# Patient Record
Sex: Female | Born: 1935 | Race: White | Hispanic: No | Marital: Married | Smoking: Former smoker
Health system: Southern US, Community
[De-identification: ages and names within clinical notes are randomized; demographics above are authoritative.]

## PROBLEM LIST (undated history)

## (undated) DIAGNOSIS — R55 Syncope and collapse: Secondary | ICD-10-CM

## (undated) DIAGNOSIS — E440 Moderate protein-calorie malnutrition: Secondary | ICD-10-CM

## (undated) DIAGNOSIS — R2681 Unsteadiness on feet: Secondary | ICD-10-CM

## (undated) DIAGNOSIS — E785 Hyperlipidemia, unspecified: Secondary | ICD-10-CM

## (undated) DIAGNOSIS — Z9181 History of falling: Secondary | ICD-10-CM

## (undated) DIAGNOSIS — I7090 Unspecified atherosclerosis: Secondary | ICD-10-CM

## (undated) DIAGNOSIS — F32A Depression, unspecified: Secondary | ICD-10-CM

## (undated) DIAGNOSIS — R41841 Cognitive communication deficit: Secondary | ICD-10-CM

## (undated) DIAGNOSIS — M6281 Muscle weakness (generalized): Secondary | ICD-10-CM

## (undated) DIAGNOSIS — F0390 Unspecified dementia without behavioral disturbance: Secondary | ICD-10-CM

## (undated) DIAGNOSIS — E1149 Type 2 diabetes mellitus with other diabetic neurological complication: Secondary | ICD-10-CM

## (undated) DIAGNOSIS — E119 Type 2 diabetes mellitus without complications: Secondary | ICD-10-CM

## (undated) DIAGNOSIS — I10 Essential (primary) hypertension: Secondary | ICD-10-CM

## (undated) DIAGNOSIS — S72142A Displaced intertrochanteric fracture of left femur, initial encounter for closed fracture: Secondary | ICD-10-CM

## (undated) HISTORY — PX: JOINT REPLACEMENT: SHX530

## (undated) HISTORY — DX: Type 2 diabetes mellitus with other diabetic neurological complication: E11.49

---

## 2019-08-18 ENCOUNTER — Emergency Department (HOSPITAL_COMMUNITY)
Admission: EM | Admit: 2019-08-18 | Discharge: 2019-08-18 | Disposition: A | Discharge status: Home / Self Care | Payer: BC Managed Care – PPO | Attending: Emergency Medicine | Admitting: Emergency Medicine

## 2019-08-18 ENCOUNTER — Emergency Department (HOSPITAL_COMMUNITY): Payer: BC Managed Care – PPO

## 2019-08-18 ENCOUNTER — Encounter (HOSPITAL_COMMUNITY): Payer: Self-pay | Admitting: Emergency Medicine

## 2019-08-18 ENCOUNTER — Other Ambulatory Visit: Payer: Self-pay

## 2019-08-18 DIAGNOSIS — R531 Weakness: Secondary | ICD-10-CM | POA: Insufficient documentation

## 2019-08-18 DIAGNOSIS — F0390 Unspecified dementia without behavioral disturbance: Secondary | ICD-10-CM | POA: Insufficient documentation

## 2019-08-18 DIAGNOSIS — Z794 Long term (current) use of insulin: Secondary | ICD-10-CM | POA: Insufficient documentation

## 2019-08-18 DIAGNOSIS — R55 Syncope and collapse: Secondary | ICD-10-CM | POA: Diagnosis present

## 2019-08-18 DIAGNOSIS — Z79899 Other long term (current) drug therapy: Secondary | ICD-10-CM | POA: Diagnosis not present

## 2019-08-18 DIAGNOSIS — E119 Type 2 diabetes mellitus without complications: Secondary | ICD-10-CM | POA: Insufficient documentation

## 2019-08-18 HISTORY — DX: Syncope and collapse: R55

## 2019-08-18 HISTORY — DX: Unspecified dementia, unspecified severity, without behavioral disturbance, psychotic disturbance, mood disturbance, and anxiety: F03.90

## 2019-08-18 HISTORY — DX: Type 2 diabetes mellitus without complications: E11.9

## 2019-08-18 LAB — URINALYSIS, ROUTINE W REFLEX MICROSCOPIC
Bacteria, UA: NONE SEEN
Bilirubin Urine: NEGATIVE
Glucose, UA: NEGATIVE mg/dL
Hgb urine dipstick: NEGATIVE
Ketones, ur: NEGATIVE mg/dL
Nitrite: NEGATIVE
Protein, ur: NEGATIVE mg/dL
Specific Gravity, Urine: 1.023 (ref 1.005–1.030)
pH: 5 (ref 5.0–8.0)

## 2019-08-18 LAB — COMPREHENSIVE METABOLIC PANEL
ALT: 14 U/L (ref 0–44)
AST: 20 U/L (ref 15–41)
Albumin: 3.9 g/dL (ref 3.5–5.0)
Alkaline Phosphatase: 50 U/L (ref 38–126)
Anion gap: 8 (ref 5–15)
BUN: 17 mg/dL (ref 8–23)
CO2: 28 mmol/L (ref 22–32)
Calcium: 9.3 mg/dL (ref 8.9–10.3)
Chloride: 105 mmol/L (ref 98–111)
Creatinine, Ser: 0.98 mg/dL (ref 0.44–1.00)
GFR calc Af Amer: >60 mL/min (ref >60)
GFR calc non Af Amer: 54 mL/min — ABNORMAL LOW (ref >60)
Glucose, Bld: 114 mg/dL — ABNORMAL HIGH (ref 70–99)
Potassium: 4.2 mmol/L (ref 3.5–5.1)
Sodium: 141 mmol/L (ref 135–145)
Total Bilirubin: 0.5 mg/dL (ref 0.3–1.2)
Total Protein: 7 g/dL (ref 6.5–8.1)

## 2019-08-18 LAB — TROPONIN I (HIGH SENSITIVITY)
Troponin I (High Sensitivity): 8 ng/L (ref <18)
Troponin I (High Sensitivity): 8 ng/L (ref <18)

## 2019-08-18 LAB — CBC
HCT: 41.7 % (ref 36.0–46.0)
Hemoglobin: 13.2 g/dL (ref 12.0–15.0)
MCH: 31.3 pg (ref 26.0–34.0)
MCHC: 31.7 g/dL (ref 30.0–36.0)
MCV: 98.8 fL (ref 80.0–100.0)
Platelets: 175 10*3/uL (ref 150–400)
RBC: 4.22 MIL/uL (ref 3.87–5.11)
RDW: 14 % (ref 11.5–15.5)
WBC: 6.6 10*3/uL (ref 4.0–10.5)
nRBC: 0 % (ref 0.0–0.2)

## 2019-08-18 LAB — HEMOGLOBIN A1C
Hgb A1c MFr Bld: 7.1 % — ABNORMAL HIGH (ref 4.8–5.6)
Mean Plasma Glucose: 157.07 mg/dL

## 2019-08-18 LAB — CBG MONITORING, ED: Glucose-Capillary: 184 mg/dL — ABNORMAL HIGH (ref 70–99)

## 2019-08-18 MED ORDER — LORAZEPAM 2 MG/ML IJ SOLN
0.5000 mg | Freq: Once | INTRAMUSCULAR | Status: AC
  Administered 2019-08-18: 0.5 mg via INTRAVENOUS
  Filled 2019-08-18: qty 1

## 2019-08-18 NOTE — ED Triage Notes (Signed)
Patient and daughter are poor historians. They are unaware what medication the patient takes or past medical history. Recent move from [place].

## 2019-08-18 NOTE — ED Triage Notes (Signed)
Pt here for evaluation for syncopal episodes. Patients daughter states that she has had 3 episodes over the past 2 weeks. Two times she was on the toilet and one time on the chair.

## 2019-08-18 NOTE — Discharge Instructions (Addendum)
You were seen in the ER for possible episodes of passing out.  Your CT scan was negative for any signs of new or old stroke / brain bleeds.  Your labs do not show any electrolyte abnormalities, EKG and labs do not show a signs of heart attack or infection.  Your blood sugar is slightly elevated but this is consistent with your diabetes.  At this point in your care we have ruled out life-threatening causes of your episodes.  Please follow-up with your neurologist and primary care provider for further management of your symptoms.  Return to the ER if your symptoms worsen.

## 2019-08-18 NOTE — ED Provider Notes (Signed)
[facility] EMERGENCY DEPARTMENT Provider Note   CSN: [id number] Arrival date & time: 08/18/19  1246     History Chief Complaint  Patient presents with  . syncopal episodes    Misty Price is a 84 y.o. female.  HPI  84 year old female with a known history of DM TII (unknown  other medical problems as patient and daughter are poor historians and no previous records available) presents to the ER after 3 "syncopal" episodes in the ER.  History provided mostly by the patient's daughter who is at bedside. Daughter reports that her mother has been living in [name] with her husband up until approximately a month ago.  Patient's daughter and her sister are largely unaware of the patient's history other than the fact that she has diabetes which requires blood sugar checks in the morning and taking insulin at night.  Patient's first episode happened approximately 3 weeks ago in the bathroom.  Patient was on the toilet and the daughter reports that she closed her eyes and started slumping over.  Daughter is unsure if she was completely unconscious but states "I yelled at her a couple times and she came back to me".  The first episode was witnessed by the patient's daughter who is at bedside, the second episode was witnessed by her other daughter and her husband.  Third episode was was witnessed by the daughter who is at bedside.  During these episodes, daughter denies any pale skin, twitching, seizures, diaphoresis, postictal confusion.  The last episode occurred on Monday and was not as bad per the patient's daughter, stating "she tried to pass out on me but I kept yelling and she stayed with me".  Daughter has not noticed any neuro deficits, patient ambulating normally. All episodes had a similar timeline- first thing in the morning and with patient sitting on a chair or toilet.  Patient has dementia at baseline.  Unclear if she is on blood thinners.  She is also unaware of what meds she takes.  No  history of strokes.  Unclear if she has any cardiac history.  The daughter states that she thought that her mother's blood sugar might have been low before these episodes and so she gave her orange juice. After these episodes, patient appears normal throughout the rest of the day.   Patient was recently taken to PCP Catalina Pizza MD in [place] and he had concerns for possible seizure activity and that she needed a CT scan.  She was referred to a neurologist but they were booked out for few weeks and patient's daughter was told that the quickest way to get a CT scan was in the ER which is what brings them in today.   It is important to underline that a proper medical and medication history was difficult to obtainas patient and daughter poor historians. Patient reports no symptoms at this time.   Past Medical History:  Diagnosis Date  . Dementia (HCC)   . Diabetes mellitus without complication (HCC)   . Syncopal episodes     There are no problems to display for this patient.   Past Surgical History:  Procedure Laterality Date  . JOINT REPLACEMENT     bilateral knee replacement     OB History   No obstetric history on file.     No family history on file.  Social History   Tobacco Use  . Smoking status: Former Games developer  . Smokeless tobacco: Never Used  Substance Use Topics  . Alcohol use:  Not on file  . Drug use: Not on file    Home Medications Prior to Admission medications   Medication Sig Start Date End Date Taking? Authorizing Provider  aspirin EC 81 MG tablet Take 81 mg by mouth daily.   Yes [provider]  donepezil (ARICEPT) 10 MG tablet Take 10 mg by mouth daily.   Yes [provider]  gabapentin (NEURONTIN) 300 MG capsule Take 300 mg by mouth at bedtime.   Yes [provider]  insulin glargine (LANTUS SOLOSTAR) 100 UNIT/ML Solostar Pen Inject 34 Units into the skin at bedtime.   Yes [provider]  isosorbide mononitrate (IMDUR) 60  MG 24 hr tablet Take 60 mg by mouth daily.   Yes [provider]  levETIRAcetam (KEPPRA) 500 MG tablet Take 500 mg by mouth 2 (two) times daily.   Yes [provider]  linagliptin (TRADJENTA) 5 MG TABS tablet Take 5 mg by mouth daily.   Yes [provider]  lisinopril (ZESTRIL) 20 MG tablet Take 20 mg by mouth daily.   Yes [provider]  memantine (NAMENDA) 10 MG tablet Take 10 mg by mouth 2 (two) times daily.   Yes [provider]  metoprolol tartrate (LOPRESSOR) 50 MG tablet Take 50 mg by mouth 2 (two) times daily.   Yes [provider]  sertraline (ZOLOFT) 100 MG tablet Take 100 mg by mouth daily.   Yes [provider]    Allergies    Patient has no allergy information on record.  Review of Systems   Review of Systems  Constitutional: Negative for appetite change, chills, diaphoresis and fever.  HENT: Negative for ear pain, sinus pain and sore throat.   Eyes: Negative for pain and visual disturbance.  Respiratory: Negative for cough and shortness of breath.   Cardiovascular: Negative for chest pain and palpitations.  Gastrointestinal: Negative for abdominal pain and vomiting.  Endocrine: Negative for polydipsia, polyphagia and polyuria.  Genitourinary: Negative for dysuria, hematuria and pelvic pain.  Musculoskeletal: Negative for arthralgias and back pain.  Skin: Negative for color change and rash.  Neurological: Positive for syncope and weakness. Negative for dizziness and seizures.  All other systems reviewed and are negative.   Physical Exam Updated Vital Signs BP (!) 152/67 (BP Location: Left Arm)   Pulse (!) 53   Temp 97.8 F (36.6 C) (Oral)   Resp 18   Ht 5\' 2"  (1.575 m)   SpO2 94%   Physical Exam Vitals and nursing note reviewed.  Constitutional:      General: She is not in acute distress.    Appearance: Normal appearance. She is well-developed. She is not ill-appearing, toxic-appearing or  diaphoretic.  HENT:     Head: Normocephalic and atraumatic.     Mouth/Throat:     Mouth: Mucous membranes are moist.     Pharynx: Oropharynx is clear.  Eyes:     Extraocular Movements: Extraocular movements intact.     Conjunctiva/sclera: Conjunctivae normal.     Pupils: Pupils are equal, round, and reactive to light.  Cardiovascular:     Rate and Rhythm: Normal rate and regular rhythm.     Heart sounds: No murmur.  Pulmonary:     Effort: Pulmonary effort is normal. No respiratory distress.     Breath sounds: Normal breath sounds.  Abdominal:     General: Abdomen is flat.     Palpations: Abdomen is soft.     Tenderness: There is no abdominal tenderness.  Musculoskeletal:        General: Normal range of motion.     Cervical back: Neck supple.  Skin:    General: Skin is warm and dry.  Neurological:     General: No focal deficit present.     Mental Status: She is alert. Mental status is at baseline.     Cranial Nerves: No cranial nerve deficit.     Sensory: No sensory deficit.     Motor: No weakness.     Coordination: Coordination normal.     Gait: Gait normal.     Deep Tendon Reflexes: Reflexes normal.  Psychiatric:        Mood and Affect: Mood normal.        Behavior: Behavior normal.     ED Results / Procedures / Treatments   Labs (all labs ordered are listed, but only abnormal results are displayed) Labs Reviewed  COMPREHENSIVE METABOLIC PANEL - Abnormal; Notable for the following components:      Result Value   Glucose, Bld 114 (*)    GFR calc non Af Amer 54 (*)    All other components within normal limits  URINALYSIS, ROUTINE W REFLEX MICROSCOPIC - Abnormal; Notable for the following components:   Leukocytes,Ua TRACE (*)    All other components within normal limits  CBG MONITORING, ED - Abnormal; Notable for the following components:   Glucose-Capillary 184 (*)    All other components within normal limits  CBC  HEMOGLOBIN A1C  TROPONIN I (HIGH SENSITIVITY)    TROPONIN I (HIGH SENSITIVITY)    EKG None  Radiology CT Head Wo Contrast  Result Date: 08/18/2019 CLINICAL DATA:  Recurrent syncope EXAM: CT HEAD WITHOUT CONTRAST TECHNIQUE: Contiguous axial images were obtained from the base of the skull through the vertex without intravenous contrast. COMPARISON:  None. FINDINGS: Brain: No acute infarct or hemorrhage. Lateral ventricles and midline structures are unremarkable. No acute extra-axial fluid collections. No mass effect. Vascular: No hyperdense vessel or unexpected calcification. Skull: Normal. Negative for fracture or focal lesion. Sinuses/Orbits: Partial opacification right sphenoid air cells. Remaining sinuses are clear. Other: None IMPRESSION: 1. No acute intracranial pathology. 2. Partial opacification right sphenoid air cells. Electronically Signed   By: Sharlet Salina M.D.   On: 08/18/2019 15:42    Procedures Procedures (including critical care time)  Medications Ordered in ED Medications  LORazepam (ATIVAN) injection 0.5 mg (0.5 mg Intravenous Given 08/18/19 1615)    ED Course  I have reviewed the triage vital signs and the nursing notes.  Pertinent labs & imaging results that were available during my care of the patient were reviewed by me and considered in my medical decision making (see chart for details).    MDM Rules/Calculators/A&P                       84 year old female with a history of diabetes, unknown other medical problems  presents to the ER for "syncopal" episodes.  Patient mildly hypertensive on presentation with a pulse of 53.  It is unclear if this is her baseline as no previous records of vitals can be found and family is unaware of her baseline.  Patient well-appearing, no acute distress, alert and pleasantly confused, dementia at baseline, able to answer questions, no noticeable neuro or musculoskeletal deficits.  Glucose mildly elevated at 114 but otherwise CMP not concerning for electrolyte abnormalities or  kidney dysfunction.  Urinalysis negative for UTI.  Initial troponin normal.  After discussion with  Dr. Estell Harpin, a second troponin is not indicated.  Bedside blood glucose 184.  A1c ordered but likely will not receive results back today.  Initial troponin negative.  CT scan negative for stroke, intracranial bleed, or any acute pathology. The patient denies any symptoms of neurological impairment or TIA's; no amaurosis, diplopia, dysphasia, or unilateral disturbance of motor or sensory function. No loss of balance or vertigo.  Orthostatic systolic pressure did drop from sitting to standing, however heart rate was remained normal and the patient was asymptomatic. EKG not concerning for acute MI or ischemia.  At this point in ED course, doubt stroke, TIA, intracranial bleed, seizure, dissection, electrolyte abnormality.  Etiology of episodes unclear, could potentially be due to low blood glucose in the mornings.  Daughter does state that her primary care had decreased her insulin a little bit at her last visit on Monday. Discussed ED course with the patient's daughter, encouraged her to follow-up with patient's primary care doctor to manage her DM. Return precautions given.  Patient's daughter voices understanding and is agreeable to this plan.  This patient was seen and evaluated by Dr. Estell Harpin, he agrees with the plan of care.   Final Clinical Impression(s) / ED Diagnoses Final diagnoses:  Weakness    Rx / DC Orders ED Discharge Orders    None       Leone Brand 08/18/19 1736    Bethann Berkshire, MD 08/18/19 2111

## 2019-09-26 ENCOUNTER — Encounter (HOSPITAL_COMMUNITY): Payer: Self-pay | Admitting: *Deleted

## 2019-09-26 ENCOUNTER — Other Ambulatory Visit: Payer: Self-pay

## 2019-09-26 ENCOUNTER — Emergency Department (HOSPITAL_COMMUNITY): Payer: Medicare HMO

## 2019-09-26 ENCOUNTER — Observation Stay (HOSPITAL_COMMUNITY)
Admission: EM | Admit: 2019-09-26 | Discharge: 2019-09-28 | Disposition: A | Discharge status: Home / Self Care | Payer: Medicare HMO | Attending: Emergency Medicine | Admitting: Emergency Medicine

## 2019-09-26 DIAGNOSIS — E1169 Type 2 diabetes mellitus with other specified complication: Secondary | ICD-10-CM | POA: Diagnosis present

## 2019-09-26 DIAGNOSIS — E119 Type 2 diabetes mellitus without complications: Secondary | ICD-10-CM | POA: Insufficient documentation

## 2019-09-26 DIAGNOSIS — M7989 Other specified soft tissue disorders: Secondary | ICD-10-CM | POA: Diagnosis not present

## 2019-09-26 DIAGNOSIS — E1151 Type 2 diabetes mellitus with diabetic peripheral angiopathy without gangrene: Secondary | ICD-10-CM | POA: Diagnosis present

## 2019-09-26 DIAGNOSIS — R296 Repeated falls: Secondary | ICD-10-CM | POA: Diagnosis not present

## 2019-09-26 DIAGNOSIS — Z20822 Contact with and (suspected) exposure to covid-19: Secondary | ICD-10-CM | POA: Diagnosis not present

## 2019-09-26 DIAGNOSIS — Z79899 Other long term (current) drug therapy: Secondary | ICD-10-CM | POA: Insufficient documentation

## 2019-09-26 DIAGNOSIS — Z7982 Long term (current) use of aspirin: Secondary | ICD-10-CM | POA: Diagnosis not present

## 2019-09-26 DIAGNOSIS — I10 Essential (primary) hypertension: Secondary | ICD-10-CM | POA: Insufficient documentation

## 2019-09-26 DIAGNOSIS — W1839XA Other fall on same level, initial encounter: Secondary | ICD-10-CM | POA: Insufficient documentation

## 2019-09-26 DIAGNOSIS — R079 Chest pain, unspecified: Secondary | ICD-10-CM | POA: Diagnosis not present

## 2019-09-26 DIAGNOSIS — M79621 Pain in right upper arm: Secondary | ICD-10-CM | POA: Diagnosis not present

## 2019-09-26 DIAGNOSIS — E118 Type 2 diabetes mellitus with unspecified complications: Secondary | ICD-10-CM | POA: Diagnosis present

## 2019-09-26 DIAGNOSIS — W19XXXA Unspecified fall, initial encounter: Secondary | ICD-10-CM | POA: Diagnosis not present

## 2019-09-26 DIAGNOSIS — S79912A Unspecified injury of left hip, initial encounter: Secondary | ICD-10-CM | POA: Diagnosis not present

## 2019-09-26 DIAGNOSIS — S0990XA Unspecified injury of head, initial encounter: Secondary | ICD-10-CM | POA: Diagnosis not present

## 2019-09-26 DIAGNOSIS — Z03818 Encounter for observation for suspected exposure to other biological agents ruled out: Secondary | ICD-10-CM | POA: Diagnosis not present

## 2019-09-26 DIAGNOSIS — E1159 Type 2 diabetes mellitus with other circulatory complications: Secondary | ICD-10-CM | POA: Diagnosis present

## 2019-09-26 DIAGNOSIS — Z794 Long term (current) use of insulin: Secondary | ICD-10-CM | POA: Diagnosis not present

## 2019-09-26 DIAGNOSIS — F0390 Unspecified dementia without behavioral disturbance: Secondary | ICD-10-CM | POA: Insufficient documentation

## 2019-09-26 DIAGNOSIS — Z96653 Presence of artificial knee joint, bilateral: Secondary | ICD-10-CM | POA: Diagnosis not present

## 2019-09-26 DIAGNOSIS — M25552 Pain in left hip: Secondary | ICD-10-CM | POA: Diagnosis not present

## 2019-09-26 DIAGNOSIS — S3992XA Unspecified injury of lower back, initial encounter: Secondary | ICD-10-CM | POA: Diagnosis not present

## 2019-09-26 DIAGNOSIS — S4991XA Unspecified injury of right shoulder and upper arm, initial encounter: Secondary | ICD-10-CM | POA: Diagnosis not present

## 2019-09-26 DIAGNOSIS — S299XXA Unspecified injury of thorax, initial encounter: Secondary | ICD-10-CM | POA: Diagnosis not present

## 2019-09-26 DIAGNOSIS — S199XXA Unspecified injury of neck, initial encounter: Secondary | ICD-10-CM | POA: Diagnosis not present

## 2019-09-26 DIAGNOSIS — M25551 Pain in right hip: Secondary | ICD-10-CM | POA: Diagnosis not present

## 2019-09-26 DIAGNOSIS — S79911A Unspecified injury of right hip, initial encounter: Secondary | ICD-10-CM | POA: Diagnosis not present

## 2019-09-26 DIAGNOSIS — R55 Syncope and collapse: Principal | ICD-10-CM

## 2019-09-26 LAB — URINALYSIS, ROUTINE W REFLEX MICROSCOPIC
Bacteria, UA: NONE SEEN
Bilirubin Urine: NEGATIVE
Glucose, UA: NEGATIVE mg/dL
Ketones, ur: 5 mg/dL — AB
Leukocytes,Ua: NEGATIVE
Nitrite: NEGATIVE
Protein, ur: NEGATIVE mg/dL
Specific Gravity, Urine: 1.011 (ref 1.005–1.030)
pH: 7 (ref 5.0–8.0)

## 2019-09-26 LAB — CBC WITH DIFFERENTIAL/PLATELET
Abs Immature Granulocytes: 0.04 10*3/uL (ref 0.00–0.07)
Basophils Absolute: 0 10*3/uL (ref 0.0–0.1)
Basophils Relative: 0 %
Eosinophils Absolute: 0.1 10*3/uL (ref 0.0–0.5)
Eosinophils Relative: 1 %
HCT: 42.2 % (ref 36.0–46.0)
Hemoglobin: 13.5 g/dL (ref 12.0–15.0)
Immature Granulocytes: 0 %
Lymphocytes Relative: 15 %
Lymphs Abs: 1.5 10*3/uL (ref 0.7–4.0)
MCH: 30.7 pg (ref 26.0–34.0)
MCHC: 32 g/dL (ref 30.0–36.0)
MCV: 95.9 fL (ref 80.0–100.0)
Monocytes Absolute: 0.6 10*3/uL (ref 0.1–1.0)
Monocytes Relative: 6 %
Neutro Abs: 7.9 10*3/uL — ABNORMAL HIGH (ref 1.7–7.7)
Neutrophils Relative %: 78 %
Platelets: 153 10*3/uL (ref 150–400)
RBC: 4.4 MIL/uL (ref 3.87–5.11)
RDW: 13.4 % (ref 11.5–15.5)
WBC: 10.1 10*3/uL (ref 4.0–10.5)
nRBC: 0 % (ref 0.0–0.2)

## 2019-09-26 LAB — COMPREHENSIVE METABOLIC PANEL
ALT: 14 U/L (ref 0–44)
AST: 21 U/L (ref 15–41)
Albumin: 3.9 g/dL (ref 3.5–5.0)
Alkaline Phosphatase: 56 U/L (ref 38–126)
Anion gap: 9 (ref 5–15)
BUN: 14 mg/dL (ref 8–23)
CO2: 26 mmol/L (ref 22–32)
Calcium: 8.9 mg/dL (ref 8.9–10.3)
Chloride: 103 mmol/L (ref 98–111)
Creatinine, Ser: 0.88 mg/dL (ref 0.44–1.00)
GFR calc Af Amer: >60 mL/min (ref >60)
GFR calc non Af Amer: >60 mL/min (ref >60)
Glucose, Bld: 137 mg/dL — ABNORMAL HIGH (ref 70–99)
Potassium: 3.8 mmol/L (ref 3.5–5.1)
Sodium: 138 mmol/L (ref 135–145)
Total Bilirubin: 0.7 mg/dL (ref 0.3–1.2)
Total Protein: 6.8 g/dL (ref 6.5–8.1)

## 2019-09-26 LAB — CK: Total CK: 46 U/L (ref 38–234)

## 2019-09-26 LAB — TSH: TSH: 3.187 u[IU]/mL (ref 0.350–4.500)

## 2019-09-26 LAB — PHOSPHORUS: Phosphorus: 3.1 mg/dL (ref 2.5–4.6)

## 2019-09-26 LAB — MAGNESIUM: Magnesium: 1.9 mg/dL (ref 1.7–2.4)

## 2019-09-26 MED ORDER — ACETAMINOPHEN 325 MG PO TABS
650.0000 mg | ORAL_TABLET | Freq: Once | ORAL | Status: AC
  Administered 2019-09-26: 16:00:00 650 mg via ORAL
  Filled 2019-09-26: qty 2

## 2019-09-26 MED ORDER — HYDRALAZINE HCL 20 MG/ML IJ SOLN
5.0000 mg | Freq: Once | INTRAMUSCULAR | Status: AC
  Administered 2019-09-26: 21:00:00 5 mg via INTRAVENOUS
  Filled 2019-09-26: qty 1

## 2019-09-26 MED ORDER — ISOSORBIDE MONONITRATE ER 60 MG PO TB24
60.0000 mg | ORAL_TABLET | Freq: Every day | ORAL | Status: DC
  Administered 2019-09-27 – 2019-09-28 (×2): 60 mg via ORAL
  Filled 2019-09-26 (×5): qty 1

## 2019-09-26 MED ORDER — LISINOPRIL 10 MG PO TABS
20.0000 mg | ORAL_TABLET | Freq: Every day | ORAL | Status: DC
  Administered 2019-09-26 – 2019-09-28 (×3): 20 mg via ORAL
  Filled 2019-09-26 (×3): qty 2

## 2019-09-26 MED ORDER — POTASSIUM CHLORIDE IN NACL 20-0.9 MEQ/L-% IV SOLN: INTRAVENOUS | Status: DC

## 2019-09-26 MED ORDER — POTASSIUM CHLORIDE IN NACL 20-0.9 MEQ/L-% IV SOLN
INTRAVENOUS | Status: AC
  Filled 2019-09-26: qty 1000

## 2019-09-26 NOTE — ED Notes (Signed)
Helped pt up the the bedside commode, pt then started looking off and stopped talking and interacting. Helped pt back into bed, pt appeared to have a syncopal episode, monitors replaced, pt appears to be in nsr/ sb, pt seems to have a few more episodes where she stops talking and looks off into space.  PA notified, family remains at bedside.

## 2019-09-26 NOTE — ED Notes (Signed)
Pt sat up and said that she felt like she was going to pass out.  Laid pt back down, pt talking with daughter, who remains at bedside.

## 2019-09-26 NOTE — ED Notes (Signed)
Per ed tech pt vomited some yellow liquid, went to pt's room, pt denies nausea at this time, states that she feels better.

## 2019-09-26 NOTE — Discharge Instructions (Addendum)
Syncope Syncope is when you pass out (faint) for a short time. It is caused by a sudden decrease in blood flow to the brain. Signs that you may be about to pass out include:  Feeling dizzy or light-headed.  Feeling sick to your stomach (nauseous).  Seeing all white or all black.  Having cold, clammy skin. If you pass out, get help right away. Call your local emergency services (911 in the [place] Do not drive yourself to the hospital. Follow these instructions at home: Watch for any changes in your symptoms. Take these actions to stay safe and help with your symptoms: Lifestyle  Do not drive, use machinery, or play sports until your doctor says it is okay.  Do not drink alcohol.  Do not use any products that contain nicotine or tobacco, such as cigarettes and e-cigarettes. If you need help quitting, ask your doctor.  Drink enough fluid to keep your pee (urine) pale yellow. General instructions  Take over-the-counter and prescription medicines only as told by your doctor.  If you are taking blood pressure or heart medicine, sit up and stand up slowly. Spend a few minutes getting ready to sit and then stand. This can help you feel less dizzy.  Have someone stay with you until you feel stable.  If you start to feel like you might pass out, lie down right away and raise (elevate) your feet above the level of your heart. Breathe deeply and steadily. Wait until all of the symptoms are gone.  Keep all follow-up visits as told by your doctor. This is important. Get help right away if:  You have a very bad headache.  You pass out once or more than once.  You have pain in your chest, belly, or back.  You have a very fast or uneven heartbeat (palpitations).  It hurts to breathe.  You are bleeding from your mouth or your bottom (rectum).  You have black or tarry poop (stool).  You have jerky movements that you cannot control (seizure).  You are confused.  You have trouble  walking.  You are very weak.  You have vision problems. These symptoms may be an emergency. Do not wait to see if the symptoms will go away. Get medical help right away. Call your local emergency services (911 in the [place] Do not drive yourself to the hospital. Summary  Syncope is when you pass out (faint) for a short time. It is caused by a sudden decrease in blood flow to the brain.  Signs that you may be about to faint include feeling dizzy, light-headed, or sick to your stomach, seeing all white or all black, or having cold, clammy skin.  If you start to feel like you might pass out, lie down right away and raise (elevate) your feet above the level of your heart. Breathe deeply and steadily. Wait until all of the symptoms are gone. This information is not intended to replace advice given to you by your health care provider. Make sure you discuss any questions you have with your health care provider. Document Revised: 06/25/2017 Document Reviewed: 06/25/2017 Elsevier Patient Education  2020 [employer]   IMPORTANT INFORMATION: PAY CLOSE ATTENTION   PHYSICIAN DISCHARGE INSTRUCTIONS  Follow with Primary care provider  Benita Stabile, MD  and other consultants as instructed by your Hospitalist Physician  SEEK MEDICAL CARE OR RETURN TO EMERGENCY ROOM IF SYMPTOMS COME BACK, WORSEN OR NEW PROBLEM DEVELOPS   Please note: You were cared for by  a hospitalist during your hospital stay. Every effort will be made to forward records to your primary care provider.  You can request that your primary care provider send for your hospital records if they have not received them.  Once you are discharged, your primary care physician will handle any further medical issues. Please note that NO REFILLS for any discharge medications will be authorized once you are discharged, as it is imperative that you return to your primary care physician (or establish a relationship with a primary care physician if you  do not have one) for your post hospital discharge needs so that they can reassess your need for medications and monitor your lab values.  Please get a complete blood count and chemistry panel checked by your Primary MD at your next visit, and again as instructed by your Primary MD.  Get Medicines reviewed and adjusted: Please take all your medications with you for your next visit with your Primary MD  Laboratory/radiological data: Please request your Primary MD to go over all hospital tests and procedure/radiological results at the follow up, please ask your primary care provider to get all Hospital records sent to his/her office.  In some cases, they will be blood work, cultures and biopsy results pending at the time of your discharge. Please request that your primary care provider follow up on these results.  If you are diabetic, please bring your blood sugar readings with you to your follow up appointment with primary care.    Please call and make your follow up appointments as soon as possible.    Also Note the following: If you experience worsening of your admission symptoms, develop shortness of breath, life threatening emergency, suicidal or homicidal thoughts you must seek medical attention immediately by calling 911 or calling your MD immediately  if symptoms less severe.  You must read complete instructions/literature along with all the possible adverse reactions/side effects for all the Medicines you take and that have been prescribed to you. Take any new Medicines after you have completely understood and accpet all the possible adverse reactions/side effects.   Do not drive when taking Pain medications or sleeping medications (Benzodiazepines)  Do not take more than prescribed Pain, Sleep and Anxiety Medications. It is not advisable to combine anxiety,sleep and pain medications without talking with your primary care practitioner  Special Instructions: If you have smoked or chewed  Tobacco  in the last 2 yrs please stop smoking, stop any regular Alcohol  and or any Recreational drug use.  Wear Seat belts while driving.  Do not drive if taking any narcotic, mind altering or controlled substances or recreational drugs or alcohol.

## 2019-09-26 NOTE — ED Provider Notes (Signed)
[facility] EMERGENCY DEPARTMENT Provider Note   CSN: [id number] Arrival date & time: 09/26/19  1347     History Chief Complaint  Patient presents with  . Fall    Misty Price is a 84 y.o. female.  HPI Patient is an 84 year old female with a history of dementia, DM and prior episodes of syncope.   Patient presents today with daughter at bedside who provides history.  Per daughter approximately 4111 AM this morning patient--who lives at home with her husband of similar age--fell between the toilet and the bathtub.  She struck her head during the fall and had a bruise on her head.  She went to the bedroom and laid down with the aid of her husband however within the hour she got up from the bed and walk to the bathroom when she fell again.  This was unwitnessed.  She is unable to provide any history as she does not member the incident due to her baseline dementia.  Per daughter at bedside she is not acting significantly different from her normal self apart from being somewhat more sleepy.  She was brought to emergency department for evaluation.   Level 5 caveat due to dementia      Past Medical History:  Diagnosis Date  . Dementia (HCC)   . Diabetes mellitus without complication (HCC)   . Syncopal episodes     Patient Active Problem List   Diagnosis Date Noted  . Fall 09/26/2019    Past Surgical History:  Procedure Laterality Date  . JOINT REPLACEMENT     bilateral knee replacement     OB History   No obstetric history on file.     History reviewed. No pertinent family history.  Social History   Tobacco Use  . Smoking status: Former Games developermoker  . Smokeless tobacco: Never Used  Substance Use Topics  . Alcohol use: Not on file  . Drug use: Not on file    Home Medications Prior to Admission medications   Medication Sig Start Date End Date Taking? Authorizing Provider  aspirin EC 81 MG tablet Take 81 mg by mouth daily.   Yes [provider]  donepezil  (ARICEPT) 10 MG tablet Take 10 mg by mouth daily.   Yes [provider]  gabapentin (NEURONTIN) 300 MG capsule Take 300 mg by mouth at bedtime.   Yes [provider]  insulin glargine (LANTUS SOLOSTAR) 100 UNIT/ML Solostar Pen Inject 15 Units into the skin at bedtime.    Yes [provider]  isosorbide mononitrate (IMDUR) 60 MG 24 hr tablet Take 60 mg by mouth daily.   Yes [provider]  linagliptin (TRADJENTA) 5 MG TABS tablet Take 5 mg by mouth daily.   Yes [provider]  lisinopril (ZESTRIL) 20 MG tablet Take 20 mg by mouth daily.   Yes [provider]  memantine (NAMENDA) 10 MG tablet Take 10 mg by mouth 2 (two) times daily.   Yes [provider]  metoprolol tartrate (LOPRESSOR) 50 MG tablet Take 50 mg by mouth 2 (two) times daily.   Yes [provider]  sertraline (ZOLOFT) 100 MG tablet Take 100 mg by mouth daily.   Yes [provider]    Allergies    Keppra [levetiracetam]  Review of Systems   Review of Systems  Unable to perform ROS: Dementia    Physical Exam Updated Vital Signs BP (!) 208/71 (BP Location: Left Arm)   Pulse (!) 58   Temp 98.1 F (  36.7 C) (Oral)   Resp (!) 22   Ht 5\' 2"  (1.575 m)   Wt 77.1 kg   SpO2 94%   BMI 31.09 kg/m   Physical Exam Vitals and nursing note reviewed.  Constitutional:      General: She is not in acute distress. HENT:     Head: Normocephalic and atraumatic.     Nose: Nose normal.     Mouth/Throat:     Mouth: Mucous membranes are moist.  Eyes:     General: No scleral icterus. Cardiovascular:     Rate and Rhythm: Normal rate and regular rhythm.     Pulses: Normal pulses.     Heart sounds: Normal heart sounds.  Pulmonary:     Effort: Pulmonary effort is normal. No respiratory distress.     Breath sounds: No wheezing.  Abdominal:     Palpations: Abdomen is soft.     Tenderness: There is no abdominal tenderness. There is no guarding or rebound.    Musculoskeletal:     Cervical back: Normal range of motion.     Right lower leg: No edema.     Left lower leg: No edema.     Comments: Some mild tenderness to palpation of the left hip.  Tenderness with palpation of the right shoulder however there is full range of motion of shoulder.  Grip strength 5/5 symmetrically.  Flexion, extension, abduction and abduction of shoulder passively and actively intact. Strength 5/5 in all upper and lower extremity joints.  Just palpation of the hip and the anterior superior iliac crest.  No bruising or deformity.  Skin:    General: Skin is warm and dry.     Capillary Refill: Capillary refill takes less than 2 seconds.     Comments: Significant bruising over the right shoulder/humerus.  Diffuse bruising over hands.   Neurological:     Mental Status: She is alert. Mental status is at baseline.     Comments: Sensation intact all 4 extremities.  Reflexes symmetric bilateral patella  Psychiatric:        Mood and Affect: Mood normal.        Behavior: Behavior normal.     ED Results / Procedures / Treatments   Labs (all labs ordered are listed, but only abnormal results are displayed) Labs Reviewed  CBC WITH DIFFERENTIAL/PLATELET - Abnormal; Notable for the following components:      Result Value   Neutro Abs 7.9 (*)    All other components within normal limits  COMPREHENSIVE METABOLIC PANEL - Abnormal; Notable for the following components:   Glucose, Bld 137 (*)    All other components within normal limits  URINE CULTURE  SARS CORONAVIRUS 2 (TAT 6-24 HRS)  URINALYSIS, ROUTINE W REFLEX MICROSCOPIC    EKG EKG Interpretation  Date/Time:  Sunday Sep 26 2019 14:03:46 EDT Ventricular Rate:  52 PR Interval:    QRS Duration: 79 QT Interval:  462 QTC Calculation: 430 R Axis:   -12 Text Interpretation: Sinus rhythm Low voltage, precordial leads Confirmed by 06-30-1997 [phone]) on 09/26/2019 2:10:36 PM   Radiology DG Chest 1 View  Result  Date: 09/26/2019 CLINICAL DATA:  Pain status post fall EXAM: CHEST  1 VIEW COMPARISON:  None. FINDINGS: The heart size and mediastinal contours are within normal limits. Both lungs are clear. The visualized skeletal structures are unremarkable. IMPRESSION: No active disease. Electronically Signed   By: 11/26/2019 M.D.   On: 09/26/2019 15:09   DG Shoulder Right  Result Date: 09/26/2019 CLINICAL DATA:  Pain status post fall EXAM: RIGHT SHOULDER - 2+ VIEW COMPARISON:  None. FINDINGS: There is no evidence of fracture or dislocation. There is no evidence of arthropathy or other focal bone abnormality. Soft tissues are unremarkable. IMPRESSION: Negative. Electronically Signed   By: Katherine Mantle M.D.   On: 09/26/2019 15:06   CT HEAD WO CONTRAST  Result Date: 09/26/2019 CLINICAL DATA:  Fall x2 today. EXAM: CT HEAD WITHOUT CONTRAST CT CERVICAL SPINE WITHOUT CONTRAST TECHNIQUE: Multidetector CT imaging of the head and cervical spine was performed following the standard protocol without intravenous contrast. Multiplanar CT image reconstructions of the cervical spine were also generated. COMPARISON:  CT head 08/18/2019 FINDINGS: CT HEAD FINDINGS Brain: Generalized atrophy without hydrocephalus. Negative for acute infarct, hemorrhage, mass. Vascular: Negative for hyperdense vessel Skull: Negative for skull fracture Sinuses/Orbits: Paranasal sinuses clear. Bilateral cataract extraction. Other: None CT CERVICAL SPINE FINDINGS Alignment: Mild anterolisthesis C3-4 and C4-5 and C5-6. Skull base and vertebrae: Negative for fracture Soft tissues and spinal canal: No soft tissue mass or edema. Atherosclerotic calcification carotid bifurcation bilaterally. Disc levels: Disc degeneration and facet degeneration in the cervical spine at multiple levels. No significant spinal stenosis. Upper chest: Lung apices clear bilaterally. Other: None IMPRESSION: 1. No acute intracranial abnormality.  Generalized atrophy 2. Cervical  spine degenerative change.  Negative for fracture. Electronically Signed   By: Marlan Palau M.D.   On: 09/26/2019 15:42   CT Cervical Spine Wo Contrast  Result Date: 09/26/2019 CLINICAL DATA:  Fall x2 today. EXAM: CT HEAD WITHOUT CONTRAST CT CERVICAL SPINE WITHOUT CONTRAST TECHNIQUE: Multidetector CT imaging of the head and cervical spine was performed following the standard protocol without intravenous contrast. Multiplanar CT image reconstructions of the cervical spine were also generated. COMPARISON:  CT head 08/18/2019 FINDINGS: CT HEAD FINDINGS Brain: Generalized atrophy without hydrocephalus. Negative for acute infarct, hemorrhage, mass. Vascular: Negative for hyperdense vessel Skull: Negative for skull fracture Sinuses/Orbits: Paranasal sinuses clear. Bilateral cataract extraction. Other: None CT CERVICAL SPINE FINDINGS Alignment: Mild anterolisthesis C3-4 and C4-5 and C5-6. Skull base and vertebrae: Negative for fracture Soft tissues and spinal canal: No soft tissue mass or edema. Atherosclerotic calcification carotid bifurcation bilaterally. Disc levels: Disc degeneration and facet degeneration in the cervical spine at multiple levels. No significant spinal stenosis. Upper chest: Lung apices clear bilaterally. Other: None IMPRESSION: 1. No acute intracranial abnormality.  Generalized atrophy 2. Cervical spine degenerative change.  Negative for fracture. Electronically Signed   By: Marlan Palau M.D.   On: 09/26/2019 15:42   CT Thoracic Spine Wo Contrast  Result Date: 09/26/2019 CLINICAL DATA:  Fall. EXAM: CT THORACIC SPINE WITHOUT CONTRAST TECHNIQUE: Multidetector CT images of the thoracic were obtained using the standard protocol without intravenous contrast. COMPARISON:  None. FINDINGS: Alignment: Normal alignment. Mild gentle kyphosis of the thoracic spine. Vertebrae: Negative for fracture Paraspinal and other soft tissues: No paraspinous mass or edema. Atherosclerotic calcification in the aorta  and coronary arteries. Mild right lower lobe atelectasis. Remaining visualized lungs are clear Disc levels: Disc degeneration and Schmorl's nodes T7-8, T8-9, T10-11, T12-L1. No focal disc protrusion or spinal stenosis. IMPRESSION: Negative for thoracic spine fracture. Coronary artery calcification Aortic Atherosclerosis (ICD10-I70.0). Electronically Signed   By: Marlan Palau M.D.   On: 09/26/2019 15:45   CT Lumbar Spine Wo Contrast  Result Date: 09/26/2019 CLINICAL DATA:  Fall x2 today.  Dementia. EXAM: CT LUMBAR SPINE WITHOUT CONTRAST TECHNIQUE: Multidetector CT imaging of the lumbar spine was performed  without intravenous contrast administration. Multiplanar CT image reconstructions were also generated. COMPARISON:  None. FINDINGS: Segmentation: Normal Alignment: Slight retrolisthesis L1-2. Vertebrae: Negative for fracture or mass Paraspinal and other soft tissues: Atherosclerotic aorta without aneurysm. No paraspinous mass or adenopathy. Disc levels: T12-L1: Disc degeneration and spurring without significant stenosis. L1-2: Disc degeneration and mild spurring without stenosis L2-3: Mild disc degeneration L3-4: Mild disc degeneration. Bilateral facet hypertrophy without significant stenosis L4-5: Moderate disc degeneration and moderate to severe facet degeneration. Moderate spinal stenosis and moderate subarticular stenosis bilaterally L5-S1: Bilateral facet degeneration with moderate subarticular stenosis bilaterally. IMPRESSION: 1. Negative for lumbar fracture 2. Lumbar degenerative changes as above. Electronically Signed   By: Franchot Gallo M.D.   On: 09/26/2019 15:29   DG Humerus Right  Result Date: 09/26/2019 CLINICAL DATA:  Pain status post fall EXAM: RIGHT HUMERUS - 2+ VIEW COMPARISON:  None. FINDINGS: There is no acute displaced fracture or dislocation. There is soft tissue swelling about the right upper extremity. The osseous mineralization is decreased. There is no unexpected radiopaque foreign  body. IMPRESSION: 1. No acute displaced fracture or dislocation. 2. Soft tissue swelling about the right upper extremity. Electronically Signed   By: Constance Holster M.D.   On: 09/26/2019 15:06   DG Hips Bilat W or Wo Pelvis 3-4 Views  Result Date: 09/26/2019 CLINICAL DATA:  Pain status post fall EXAM: DG HIP (WITH OR WITHOUT PELVIS) 3-4V BILAT COMPARISON:  None. FINDINGS: There are moderate degenerative changes of both hips. There is no definite acute displaced fracture or dislocation. The osseous mineralization is decreased. IMPRESSION: Negative. Electronically Signed   By: Constance Holster M.D.   On: 09/26/2019 15:07    Procedures Procedures (including critical care time)  Medications Ordered in ED Medications  acetaminophen (TYLENOL) tablet 650 mg (650 mg Oral Given 09/26/19 1556)    ED Course  I have reviewed the triage vital signs and the nursing notes.  Pertinent labs & imaging results that were available during my care of the patient were reviewed by me and considered in my medical decision making (see chart for details).  Patient is a 84 year old female with history of dementia, DM, syncopal episodes presented today with 2 falls that he had back to back.  She is unable to provide a history due to her dementia.  These were unwitnessed falls.  She has bruises to her right arm, contusion over the head, and tenderness to palpation of the hips.  She also has tenderness in the spine is somewhat diffuse but notable concerning for fractures.  Clinical Course as of Sep 26 1703  Sun Sep 26, 2019  1622 I independently reviewed all CT and x-ray imaging.  I reviewed the radiologist read.  CT head without any intracranial hemorrhage or acute abnormality.  No fracture visualized on CT L, T, C-spine.  Plain film of chest shows no pneumothorax or obvious fracture.  Right shoulder x-ray shows no dislocation or fracture, humeral fracture.  No fracture of hip or dislocation of femur.  All  imaging negative for fracture or acute abnormality.   [WF]  1623 EKG independently viewed by myself.  There is no acute abnormality.  Sinus bradycardia.  Patient has a history of bradycardic rhythm.  This is not significantly different from baseline.   [WF]  1624 CBC without anemia or leukocytosis.  CMP without any electrolyte abnormalities or acute abnormalities.   [WF]    Clinical Course User Index [WF] Tedd Sias, [place]   While attempting to  ambulate patient to bedside commode she experienced syncopal episode was held up by nursing staff preventing her from falling.  I discussed this case with my attending physician who cosigned this note including patient's presenting symptoms, physical exam, and planned diagnostics and interventions. Attending physician stated agreement with plan or made changes to plan which were implemented.   Attending physician assessed patient at bedside.  MDM Rules/Calculators/A&P                       Discussed with patient the need for hospitalization and further care. Patient is understanding and willing to be admitted to hospital.   Spoke with Dr. Mariea Clonts of hospitalist service who agrees to assume care of patient and bring her into the hospital for further evaluation and management.    Final Clinical Impression(s) / ED Diagnoses Final diagnoses:  Fall, initial encounter  Syncope, unspecified syncope type    Rx / DC Orders ED Discharge Orders    None       Gailen Shelter, [place] 09/26/19 1719    Pollyann Savoy, MD 09/26/19 2207

## 2019-09-26 NOTE — ED Notes (Signed)
Per Ival Bible, she spoke with MD [facility] and the plan is to stay at [facility] and go to [facility] tomorrow for MRI and if a bed is available stay there, and if not return to [facility] after her MRI

## 2019-09-26 NOTE — ED Notes (Signed)
covid spec to lab 

## 2019-09-26 NOTE — ED Triage Notes (Signed)
Pt with dementia, daughter lives with pt and was gone, daughter states pt fell x 2 today.  Pt with knot to above right eye, bruise to upper right arm and wrist.  Daughter unsure what pt hit with fall. Daughter states pt is wanting to fall asleep which is unusual.

## 2019-09-26 NOTE — H&P (Addendum)
History and Physical    Misty Price UVO:[id number] DOB: 03-14-1936 DOA: 09/26/2019  PCP: Misty Stabile, MD   Patient coming from: Home  I have personally briefly reviewed patient's old medical records in [facility]  Chief Complaint: Fall  HPI: Misty Price is a 84 y.o. female with medical history significant for dementia, diabetes mellitus, syncope.   History is obtained from daughterLynden Price who is present at bedside, as at the time of my evaluation patient was awake has dementia unable to give me history, she is also upset about being admitted to the hospital. Patient was brought to the ED by daughter with reports of a fall 2 times today.  At about 11 AM this morning, patient fell in the bathroom, between the toilet and the bathtub, she hit her head when she fell.  Within an hour she got off of her bed and was walked to the bathroom and then fell again.  Both falls were unwitnessed.   Patient was in the ED 08/18/19-reports of 3 syncopal episodes, all 3 were witnessed.  They all occurred in the mornings, when patient was sitting on a chair or toilet.  Patient has not had any subsequent episodes of falls or passing out since then. No twitching, or jerking of extremities noticed. As far as daughter knows, patient has no history of seizures, no complaints of headache, no vomiting no loose stools has chronic poor p.o. intake which is unchanged, no fevers or chills.  Daughter reports a history of vertigo and is unsure if this is related.  Patient moved from [name] about 3 months ago.,  She has been able to establish care with a primary care provider here.  Daughter is not aware if patient's blood pressure chronically runs high.  Patient ran out of her gabapentin about a month ago, but when she came to the ED in March she was still compliant with it.  ED Course: Heart rate 50s, blood pressure elevated up to 218/74.  In the ED patient had an episode when they had gotten her up to use the  bedside commode, she had did not get to the commode before she suddenly was staring into space, stopped talking, she did not exactly become completely limp.  The monitors had been removed so patient could use the commode. Patient was helped back to the bed, and vitals were immediately taken, blood pressure was more elevated now in the 200s, heart rate remained in the 50s.  Patient had other episodes when she would just stop talking and stare into space.  Review of Systems: As per HPI all other systems reviewed and negative.  Past Medical History:  Diagnosis Date  . Dementia (HCC)   . Diabetes mellitus without complication (HCC)   . Syncopal episodes     Past Surgical History:  Procedure Laterality Date  . JOINT REPLACEMENT     bilateral knee replacement     reports that she has quit smoking. She has never used smokeless tobacco. No history on file for alcohol and drug.  Allergies  Allergen Reactions  . Keppra [Levetiracetam]     Daughter states pt was "out of it" after taking it about 2 or 3 times   Family history of hypertension.   Prior to Admission medications   Medication Sig Start Date End Date Taking? Authorizing Provider  aspirin EC 81 MG tablet Take 81 mg by mouth daily.   Yes [provider]  donepezil (ARICEPT) 10 MG tablet Take 10 mg by  mouth daily.   Yes [provider]  gabapentin (NEURONTIN) 300 MG capsule Take 300 mg by mouth at bedtime.   Yes [provider]  insulin glargine (LANTUS SOLOSTAR) 100 UNIT/ML Solostar Pen Inject 15 Units into the skin at bedtime.    Yes [provider]  isosorbide mononitrate (IMDUR) 60 MG 24 hr tablet Take 60 mg by mouth daily.   Yes [provider]  linagliptin (TRADJENTA) 5 MG TABS tablet Take 5 mg by mouth daily.   Yes [provider]  lisinopril (ZESTRIL) 20 MG tablet Take 20 mg by mouth daily.   Yes [provider]  memantine (NAMENDA) 10 MG tablet Take 10 mg by mouth  2 (two) times daily.   Yes [provider]  metoprolol tartrate (LOPRESSOR) 50 MG tablet Take 50 mg by mouth 2 (two) times daily.   Yes [provider]  sertraline (ZOLOFT) 100 MG tablet Take 100 mg by mouth daily.   Yes [provider]    Physical Exam: Vitals:   09/26/19 1400 09/26/19 1538 09/26/19 1628 09/26/19 1630  BP: (!) 207/116 (!) 188/83 (!) 218/74 (!) 208/71  Pulse: (!) 56 (!) 57 65 (!) 58  Resp:  17 20 (!) 22  Temp:  98.1 F (36.7 C)    TempSrc:  Oral    SpO2:  95% 94% 94%  Weight:      Height:        Constitutional: Anxious, became tearful during exam,  Vitals:   09/26/19 1400 09/26/19 1538 09/26/19 1628 09/26/19 1630  BP: (!) 207/116 (!) 188/83 (!) 218/74 (!) 208/71  Pulse: (!) 56 (!) 57 65 (!) 58  Resp:  17 20 (!) 22  Temp:  98.1 F (36.7 C)    TempSrc:  Oral    SpO2:  95% 94% 94%  Weight:      Height:       Eyes: PERRL, lids and conjunctivae normal ENMT: Mucous membranes are dry.  Purplish discoloration to right frontotemporal region from fall today Neck: normal, supple, no masses, no thyromegaly Respiratory: clear to auscultation bilaterally, no wheezing, no crackles. Normal respiratory effort. No accessory muscle use.  Cardiovascular: Bradycardic, regular rate and rhythm, no murmurs / rubs / gallops. No extremity edema. 2+ pedal pulses.  Abdomen: no tenderness, no masses palpated. No hepatosplenomegaly. Bowel sounds positive.  Musculoskeletal: no clubbing / cyanosis. No joint deformity upper and lower extremities. Good ROM, no contractures. Normal muscle tone.  Skin: no rashes, lesions, ulcers. No induration Neurologic: Neurologic exam limited, but no facial asymmetry, speech fluent and clear, 5/5 strength bilateral upper extremity, 4+/5 strength bilateral lower extremity,.  Psychiatric: Exam limited by dementia, alert and oriented x 2.  Anxious, became tearful during exam  Labs on Admission: I have personally reviewed following  labs and imaging studies  CBC: Recent Labs  Lab 09/26/19 1425  WBC 10.1  NEUTROABS 7.9*  HGB 13.5  HCT 42.2  MCV 95.9  PLT 153   Basic Metabolic Panel: Recent Labs  Lab 09/26/19 1425  NA 138  K 3.8  CL 103  CO2 26  GLUCOSE 137*  BUN 14  CREATININE 0.88  CALCIUM 8.9   Liver Function Tests: Recent Labs  Lab 09/26/19 1425  AST 21  ALT 14  ALKPHOS 56  BILITOT 0.7  PROT 6.8  ALBUMIN 3.9   Urine analysis:    Component Value Date/Time   COLORURINE YELLOW 08/18/2019 1612   APPEARANCEUR CLEAR 08/18/2019 1612   LABSPEC 1.023  08/18/2019 1612   PHURINE 5.0 08/18/2019 1612   GLUCOSEU NEGATIVE 08/18/2019 1612   HGBUR NEGATIVE 08/18/2019 1612   BILIRUBINUR NEGATIVE 08/18/2019 1612   KETONESUR NEGATIVE 08/18/2019 1612   PROTEINUR NEGATIVE 08/18/2019 1612   NITRITE NEGATIVE 08/18/2019 1612   LEUKOCYTESUR TRACE (A) 08/18/2019 1612    Radiological Exams on Admission: DG Chest 1 View  Result Date: 09/26/2019 CLINICAL DATA:  Pain status post fall EXAM: CHEST  1 VIEW COMPARISON:  None. FINDINGS: The heart size and mediastinal contours are within normal limits. Both lungs are clear. The visualized skeletal structures are unremarkable. IMPRESSION: No active disease. Electronically Signed   By: Katherine Mantle M.D.   On: 09/26/2019 15:09   DG Shoulder Right  Result Date: 09/26/2019 CLINICAL DATA:  Pain status post fall EXAM: RIGHT SHOULDER - 2+ VIEW COMPARISON:  None. FINDINGS: There is no evidence of fracture or dislocation. There is no evidence of arthropathy or other focal bone abnormality. Soft tissues are unremarkable. IMPRESSION: Negative. Electronically Signed   By: Katherine Mantle M.D.   On: 09/26/2019 15:06   CT HEAD WO CONTRAST  Result Date: 09/26/2019 CLINICAL DATA:  Fall x2 today. EXAM: CT HEAD WITHOUT CONTRAST CT CERVICAL SPINE WITHOUT CONTRAST TECHNIQUE: Multidetector CT imaging of the head and cervical spine was performed following the standard protocol  without intravenous contrast. Multiplanar CT image reconstructions of the cervical spine were also generated. COMPARISON:  CT head 08/18/2019 FINDINGS: CT HEAD FINDINGS Brain: Generalized atrophy without hydrocephalus. Negative for acute infarct, hemorrhage, mass. Vascular: Negative for hyperdense vessel Skull: Negative for skull fracture Sinuses/Orbits: Paranasal sinuses clear. Bilateral cataract extraction. Other: None CT CERVICAL SPINE FINDINGS Alignment: Mild anterolisthesis C3-4 and C4-5 and C5-6. Skull base and vertebrae: Negative for fracture Soft tissues and spinal canal: No soft tissue mass or edema. Atherosclerotic calcification carotid bifurcation bilaterally. Disc levels: Disc degeneration and facet degeneration in the cervical spine at multiple levels. No significant spinal stenosis. Upper chest: Lung apices clear bilaterally. Other: None IMPRESSION: 1. No acute intracranial abnormality.  Generalized atrophy 2. Cervical spine degenerative change.  Negative for fracture. Electronically Signed   By: Marlan Palau M.D.   On: 09/26/2019 15:42   CT Cervical Spine Wo Contrast  Result Date: 09/26/2019 CLINICAL DATA:  Fall x2 today. EXAM: CT HEAD WITHOUT CONTRAST CT CERVICAL SPINE WITHOUT CONTRAST TECHNIQUE: Multidetector CT imaging of the head and cervical spine was performed following the standard protocol without intravenous contrast. Multiplanar CT image reconstructions of the cervical spine were also generated. COMPARISON:  CT head 08/18/2019 FINDINGS: CT HEAD FINDINGS Brain: Generalized atrophy without hydrocephalus. Negative for acute infarct, hemorrhage, mass. Vascular: Negative for hyperdense vessel Skull: Negative for skull fracture Sinuses/Orbits: Paranasal sinuses clear. Bilateral cataract extraction. Other: None CT CERVICAL SPINE FINDINGS Alignment: Mild anterolisthesis C3-4 and C4-5 and C5-6. Skull base and vertebrae: Negative for fracture Soft tissues and spinal canal: No soft tissue mass or  edema. Atherosclerotic calcification carotid bifurcation bilaterally. Disc levels: Disc degeneration and facet degeneration in the cervical spine at multiple levels. No significant spinal stenosis. Upper chest: Lung apices clear bilaterally. Other: None IMPRESSION: 1. No acute intracranial abnormality.  Generalized atrophy 2. Cervical spine degenerative change.  Negative for fracture. Electronically Signed   By: Marlan Palau M.D.   On: 09/26/2019 15:42   CT Thoracic Spine Wo Contrast  Result Date: 09/26/2019 CLINICAL DATA:  Fall. EXAM: CT THORACIC SPINE WITHOUT CONTRAST TECHNIQUE: Multidetector CT images of the thoracic were obtained using the standard protocol without intravenous  contrast. COMPARISON:  None. FINDINGS: Alignment: Normal alignment. Mild gentle kyphosis of the thoracic spine. Vertebrae: Negative for fracture Paraspinal and other soft tissues: No paraspinous mass or edema. Atherosclerotic calcification in the aorta and coronary arteries. Mild right lower lobe atelectasis. Remaining visualized lungs are clear Disc levels: Disc degeneration and Schmorl's nodes T7-8, T8-9, T10-11, T12-L1. No focal disc protrusion or spinal stenosis. IMPRESSION: Negative for thoracic spine fracture. Coronary artery calcification Aortic Atherosclerosis (ICD10-I70.0). Electronically Signed   By: Marlan Palauharles  Clark M.D.   On: 09/26/2019 15:45   CT Lumbar Spine Wo Contrast  Result Date: 09/26/2019 CLINICAL DATA:  Fall x2 today.  Dementia. EXAM: CT LUMBAR SPINE WITHOUT CONTRAST TECHNIQUE: Multidetector CT imaging of the lumbar spine was performed without intravenous contrast administration. Multiplanar CT image reconstructions were also generated. COMPARISON:  None. FINDINGS: Segmentation: Normal Alignment: Slight retrolisthesis L1-2. Vertebrae: Negative for fracture or mass Paraspinal and other soft tissues: Atherosclerotic aorta without aneurysm. No paraspinous mass or adenopathy. Disc levels: T12-L1: Disc degeneration  and spurring without significant stenosis. L1-2: Disc degeneration and mild spurring without stenosis L2-3: Mild disc degeneration L3-4: Mild disc degeneration. Bilateral facet hypertrophy without significant stenosis L4-5: Moderate disc degeneration and moderate to severe facet degeneration. Moderate spinal stenosis and moderate subarticular stenosis bilaterally L5-S1: Bilateral facet degeneration with moderate subarticular stenosis bilaterally. IMPRESSION: 1. Negative for lumbar fracture 2. Lumbar degenerative changes as above. Electronically Signed   By: Marlan Palauharles  Clark M.D.   On: 09/26/2019 15:29   DG Humerus Right  Result Date: 09/26/2019 CLINICAL DATA:  Pain status post fall EXAM: RIGHT HUMERUS - 2+ VIEW COMPARISON:  None. FINDINGS: There is no acute displaced fracture or dislocation. There is soft tissue swelling about the right upper extremity. The osseous mineralization is decreased. There is no unexpected radiopaque foreign body. IMPRESSION: 1. No acute displaced fracture or dislocation. 2. Soft tissue swelling about the right upper extremity. Electronically Signed   By: Katherine Mantlehristopher  Green M.D.   On: 09/26/2019 15:06   DG Hips Bilat W or Wo Pelvis 3-4 Views  Result Date: 09/26/2019 CLINICAL DATA:  Pain status post fall EXAM: DG HIP (WITH OR WITHOUT PELVIS) 3-4V BILAT COMPARISON:  None. FINDINGS: There are moderate degenerative changes of both hips. There is no definite acute displaced fracture or dislocation. The osseous mineralization is decreased. IMPRESSION: Negative. Electronically Signed   By: Katherine Mantlehristopher  Green M.D.   On: 09/26/2019 15:07    EKG: Independently reviewed.  Sinus bradycardia rate 52.  PR interval appears normal.  QTc 430.  No significant change compared to prior EKG .  Assessment/Plan Principal Problem:   Fall Active Problems:   Diabetes mellitus (HCC)   Dementia (HCC)   Syncope   Syncope and fall- cardiac versus neurologic etiology.  Differentials include absence  seizure, symptomatic bradycardia considering heart rates in the 50s versus vasovagal syncope. Allergies list- Keppra- this was prophylactically started about 1 -2 months ago by PCP, but daughter reports patient took 1 or 2 dose and was "out of it", so did not continue.  Daughter denies known history of seizures.  Ran out of gabapentin a month ago.  Other imaging included right shoulder and humerus x-ray, pelvic x-ray, portable chest x-ray, thoracic and lumbar CT all unremarkable. - Blood pressure also elevated on admission, no details of prior blood pressure control, doubt hypertensive encephalopathy/ PRES, but follow up MRI . Head CT unremarkable.   -Obtain echocardiogram  -Obtain EEG -Brain MRI -Check magnesium, phosphorus, TSH -Check prolactin -Gabapentin not resumed for  now. - Hold metoprolol for now - PT eval - Appears mildly dehydrated, N/s + 20 KCL 100cc/hrx 15 hrs. - Please consult neurology in the morning - Seizure precautions - Admit to [facility], no MRI available at [facility] till 5/4.  Diabetes mellitus-random glucose 137, recent A1c 7.1. -Resume home Lantus at reduced dose 7 units nightly - SSI- S -Resume home Tradjenta  Hypertension-elevated systolic up to 109, has not taking home medications today.  Daughter unaware if blood pressure is controlled at baseline. -Resume home metoprolol, lisinopril, Imdur -IV hydralazine 5 mg given with improvement in blood pressure, systolic now 323F, continue PRN dosing of IV hydralazine.  Dementia- per daughter, dementia is progressing, but at baseline able to hold a conversation, recognizes family, independent of all ADLs, ambulates without assistance or assistive devices. -Resume home donepezil, Zoloft, Namenda.  DVT prophylaxis: SCDs for now with falls, bruise to head Code Status: Full code, confirmed with daughter at bedside. Family Communication: Daughter Jocelyn Lamer at bedside is Tax adviser, patients spouse is same age and not in  best of health. Patient has no assigned HCPOA.  Disposition Plan:  1 - 2 days Consults called: neurology Admission status: obs, tele    Bethena Roys MD [facility]  09/26/2019, 10:04 PM

## 2019-09-26 NOTE — ED Notes (Signed)
Pt in bed family at bedside states that pt needs to urinate, pure wick placed

## 2019-09-27 ENCOUNTER — Observation Stay (HOSPITAL_BASED_OUTPATIENT_CLINIC_OR_DEPARTMENT_OTHER): Payer: Medicare HMO

## 2019-09-27 ENCOUNTER — Observation Stay (HOSPITAL_COMMUNITY)
Admit: 2019-09-27 | Discharge: 2019-09-27 | Disposition: A | Discharge status: Home / Self Care | Payer: Medicare HMO | Attending: Internal Medicine | Admitting: Internal Medicine

## 2019-09-27 ENCOUNTER — Ambulatory Visit (HOSPITAL_COMMUNITY)
Admit: 2019-09-27 | Discharge: 2019-09-27 | Disposition: A | Discharge status: Home / Self Care | Payer: Medicare HMO | Attending: Internal Medicine | Admitting: Internal Medicine

## 2019-09-27 DIAGNOSIS — R55 Syncope and collapse: Secondary | ICD-10-CM

## 2019-09-27 DIAGNOSIS — E1169 Type 2 diabetes mellitus with other specified complication: Secondary | ICD-10-CM | POA: Diagnosis present

## 2019-09-27 DIAGNOSIS — F0390 Unspecified dementia without behavioral disturbance: Secondary | ICD-10-CM | POA: Diagnosis not present

## 2019-09-27 DIAGNOSIS — I10 Essential (primary) hypertension: Secondary | ICD-10-CM | POA: Diagnosis present

## 2019-09-27 DIAGNOSIS — R2689 Other abnormalities of gait and mobility: Secondary | ICD-10-CM | POA: Diagnosis not present

## 2019-09-27 DIAGNOSIS — W19XXXD Unspecified fall, subsequent encounter: Secondary | ICD-10-CM

## 2019-09-27 DIAGNOSIS — E118 Type 2 diabetes mellitus with unspecified complications: Secondary | ICD-10-CM

## 2019-09-27 DIAGNOSIS — I152 Hypertension secondary to endocrine disorders: Secondary | ICD-10-CM | POA: Diagnosis present

## 2019-09-27 DIAGNOSIS — E1159 Type 2 diabetes mellitus with other circulatory complications: Secondary | ICD-10-CM | POA: Diagnosis present

## 2019-09-27 DIAGNOSIS — R569 Unspecified convulsions: Secondary | ICD-10-CM | POA: Diagnosis not present

## 2019-09-27 DIAGNOSIS — E1151 Type 2 diabetes mellitus with diabetic peripheral angiopathy without gangrene: Secondary | ICD-10-CM | POA: Diagnosis present

## 2019-09-27 LAB — CBC WITH DIFFERENTIAL/PLATELET
Abs Immature Granulocytes: 0.01 10*3/uL (ref 0.00–0.07)
Basophils Absolute: 0 10*3/uL (ref 0.0–0.1)
Basophils Relative: 0 %
Eosinophils Absolute: 0.1 10*3/uL (ref 0.0–0.5)
Eosinophils Relative: 1 %
HCT: 36.4 % (ref 36.0–46.0)
Hemoglobin: 11.9 g/dL — ABNORMAL LOW (ref 12.0–15.0)
Immature Granulocytes: 0 %
Lymphocytes Relative: 26 %
Lymphs Abs: 1.9 10*3/uL (ref 0.7–4.0)
MCH: 31.2 pg (ref 26.0–34.0)
MCHC: 32.7 g/dL (ref 30.0–36.0)
MCV: 95.3 fL (ref 80.0–100.0)
Monocytes Absolute: 0.8 10*3/uL (ref 0.1–1.0)
Monocytes Relative: 11 %
Neutro Abs: 4.5 10*3/uL (ref 1.7–7.7)
Neutrophils Relative %: 62 %
Platelets: 148 10*3/uL — ABNORMAL LOW (ref 150–400)
RBC: 3.82 MIL/uL — ABNORMAL LOW (ref 3.87–5.11)
RDW: 13.9 % (ref 11.5–15.5)
WBC: 7.2 10*3/uL (ref 4.0–10.5)
nRBC: 0 % (ref 0.0–0.2)

## 2019-09-27 LAB — COMPREHENSIVE METABOLIC PANEL
ALT: 12 U/L (ref 0–44)
AST: 22 U/L (ref 15–41)
Albumin: 3.2 g/dL — ABNORMAL LOW (ref 3.5–5.0)
Alkaline Phosphatase: 45 U/L (ref 38–126)
Anion gap: 9 (ref 5–15)
BUN: 13 mg/dL (ref 8–23)
CO2: 25 mmol/L (ref 22–32)
Calcium: 8.5 mg/dL — ABNORMAL LOW (ref 8.9–10.3)
Chloride: 106 mmol/L (ref 98–111)
Creatinine, Ser: 0.85 mg/dL (ref 0.44–1.00)
GFR calc Af Amer: >60 mL/min (ref >60)
GFR calc non Af Amer: >60 mL/min (ref >60)
Glucose, Bld: 95 mg/dL (ref 70–99)
Potassium: 3.7 mmol/L (ref 3.5–5.1)
Sodium: 140 mmol/L (ref 135–145)
Total Bilirubin: 0.8 mg/dL (ref 0.3–1.2)
Total Protein: 5.7 g/dL — ABNORMAL LOW (ref 6.5–8.1)

## 2019-09-27 LAB — MAGNESIUM: Magnesium: 1.8 mg/dL (ref 1.7–2.4)

## 2019-09-27 LAB — GLUCOSE, CAPILLARY
Glucose-Capillary: 185 mg/dL — ABNORMAL HIGH (ref 70–99)
Glucose-Capillary: 81 mg/dL (ref 70–99)
Glucose-Capillary: 107 mg/dL — ABNORMAL HIGH (ref 70–99)
Glucose-Capillary: 150 mg/dL — ABNORMAL HIGH (ref 70–99)

## 2019-09-27 LAB — SARS CORONAVIRUS 2 (TAT 6-24 HRS): SARS Coronavirus 2: NEGATIVE

## 2019-09-27 LAB — ECHOCARDIOGRAM COMPLETE
Height: 62 in
Weight: 2709.01 oz

## 2019-09-27 LAB — PHOSPHORUS: Phosphorus: 3.3 mg/dL (ref 2.5–4.6)

## 2019-09-27 MED ORDER — SERTRALINE HCL 50 MG PO TABS
100.0000 mg | ORAL_TABLET | Freq: Every day | ORAL | Status: DC
  Administered 2019-09-27 – 2019-09-28 (×2): 100 mg via ORAL
  Filled 2019-09-27 (×2): qty 2

## 2019-09-27 MED ORDER — ONDANSETRON HCL 4 MG PO TABS: 4.0000 mg | ORAL_TABLET | Freq: Four times a day (QID) | ORAL | Status: DC | PRN

## 2019-09-27 MED ORDER — ASPIRIN EC 81 MG PO TBEC
81.0000 mg | DELAYED_RELEASE_TABLET | Freq: Every day | ORAL | Status: DC
  Administered 2019-09-27 – 2019-09-28 (×2): 81 mg via ORAL
  Filled 2019-09-27 (×2): qty 1

## 2019-09-27 MED ORDER — POLYETHYLENE GLYCOL 3350 17 G PO PACK: 17.0000 g | PACK | Freq: Every day | ORAL | Status: DC | PRN

## 2019-09-27 MED ORDER — INSULIN ASPART 100 UNIT/ML ~~LOC~~ SOLN
0.0000 [IU] | Freq: Three times a day (TID) | SUBCUTANEOUS | Status: DC
  Administered 2019-09-28: 12:00:00 1 [IU] via SUBCUTANEOUS

## 2019-09-27 MED ORDER — LINAGLIPTIN 5 MG PO TABS
5.0000 mg | ORAL_TABLET | Freq: Every day | ORAL | Status: DC
  Administered 2019-09-27 – 2019-09-28 (×2): 5 mg via ORAL
  Filled 2019-09-27 (×2): qty 1

## 2019-09-27 MED ORDER — ACETAMINOPHEN 650 MG RE SUPP: 650.0000 mg | Freq: Four times a day (QID) | RECTAL | Status: DC | PRN

## 2019-09-27 MED ORDER — LORAZEPAM 2 MG/ML IJ SOLN
0.5000 mg | Freq: Once | INTRAMUSCULAR | Status: AC
  Administered 2019-09-27: 01:00:00 0.5 mg via INTRAVENOUS
  Filled 2019-09-27: qty 1

## 2019-09-27 MED ORDER — HYDRALAZINE HCL 20 MG/ML IJ SOLN
5.0000 mg | INTRAMUSCULAR | Status: DC | PRN
  Administered 2019-09-27: 01:00:00 5 mg via INTRAVENOUS
  Filled 2019-09-27: qty 1

## 2019-09-27 MED ORDER — INSULIN ASPART 100 UNIT/ML ~~LOC~~ SOLN: 0.0000 [IU] | Freq: Every day | SUBCUTANEOUS | Status: DC

## 2019-09-27 MED ORDER — ONDANSETRON HCL 4 MG/2ML IJ SOLN
4.0000 mg | Freq: Four times a day (QID) | INTRAMUSCULAR | Status: DC | PRN
  Administered 2019-09-27: 01:00:00 4 mg via INTRAVENOUS
  Filled 2019-09-27: qty 2

## 2019-09-27 MED ORDER — ACETAMINOPHEN 325 MG PO TABS
650.0000 mg | ORAL_TABLET | Freq: Four times a day (QID) | ORAL | Status: DC | PRN
  Administered 2019-09-27: 650 mg via ORAL
  Filled 2019-09-27: qty 2

## 2019-09-27 MED ORDER — DONEPEZIL HCL 5 MG PO TABS
10.0000 mg | ORAL_TABLET | Freq: Every day | ORAL | Status: DC
  Administered 2019-09-27 – 2019-09-28 (×2): 10 mg via ORAL
  Filled 2019-09-27 (×2): qty 2

## 2019-09-27 MED ORDER — INSULIN GLARGINE 100 UNIT/ML ~~LOC~~ SOLN
8.0000 [IU] | Freq: Every day | SUBCUTANEOUS | Status: DC
  Administered 2019-09-27 (×2): 8 [IU] via SUBCUTANEOUS
  Filled 2019-09-27 (×3): qty 0.08

## 2019-09-27 MED ORDER — MEMANTINE HCL 10 MG PO TABS
10.0000 mg | ORAL_TABLET | Freq: Two times a day (BID) | ORAL | Status: DC
  Administered 2019-09-27 – 2019-09-28 (×4): 10 mg via ORAL
  Filled 2019-09-27 (×4): qty 1

## 2019-09-27 NOTE — Progress Notes (Incomplete)
PROGRESS NOTE    Misty Price  HFW:[id number] DOB: 02-21-1936 DOA: 09/26/2019 PCP: Celene Squibb, MD     Brief Narrative:  84 y.o. female PMHx Dementia, type II controlled with complication, essential HTN, syncope.   History is obtained from daughterOlegario Shearer who is present at bedside, as at the time of my evaluation patient was awake has dementia unable to give me history, she is also upset about being admitted to the hospital.  Patient was brought to the ED by daughter with reports of a fall 2 times today.  At about 11 AM this morning, patient fell in the bathroom, between the toilet and the bathtub, she hit her head when she fell.  Within an hour she got off of her bed and was walked to the bathroom and then fell again.  Both falls were unwitnessed.   Patient was in the ED 08/18/19-reports of 3 syncopal episodes, all 3 were witnessed.  They all occurred in the mornings, when patient was sitting on a chair or toilet.  Patient has not had any subsequent episodes of falls or passing out since then. No twitching, or jerking of extremities noticed. As far as daughter knows, patient has no history of seizures, no complaints of headache, no vomiting no loose stools has chronic poor p.o. intake which is unchanged, no fevers or chills.  Daughter reports a history of vertigo and is unsure if this is related.  Patient moved from [place] about 3 months ago.,  She has been able to establish care with a primary care provider here.  Daughter is not aware if patient's blood pressure chronically runs high.  Patient ran out of her gabapentin about a month ago, but when she came to the ED in March she was still compliant with it.   ED Course: Heart rate 50s, blood pressure elevated up to 218/74.  In the ED patient had an episode when they had gotten her up to use the bedside commode, she had did not get to the commode before she suddenly was staring into space, stopped talking, she did not exactly become completely limp.  The monitors had been removed so patient could use the commode. Patient was helped back to the bed, and vitals were immediately taken, blood pressure was more elevated now in the 200s, heart rate remained in the 50s.  Patient had other episodes when she would just stop talking and stare into space.    Subjective: ***   Assessment & Plan:   Principal Problem:   Fall Active Problems:   Diabetes mellitus ([place])   Dementia ([place])   Syncope   Diabetes mellitus type 2, controlled, with complications ([place])   Benign essential HTN   Syncope and fall- cardiac versus neurologic etiology.  Differentials include absence seizure, symptomatic bradycardia considering heart rates in the 50s versus vasovagal syncope. Allergies list- Keppra- this was prophylactically started about 1 -2 months ago by PCP, but daughter reports patient took 1 or 2 dose and was "out of it", so did not continue.  Daughter denies known history of seizures.  Ran out of gabapentin a month ago.  Other imaging included right shoulder and humerus x-ray, pelvic x-ray, portable chest x-ray, thoracic and lumbar CT all unremarkable. - Blood pressure also elevated on admission, no details of prior blood pressure control, doubt hypertensive encephalopathy/ PRES, but follow up MRI . Head CT unremarkable.   -Obtain echocardiogram  -Obtain EEG -Brain MRI -Check magnesium, phosphorus, TSH -Check prolactin -Gabapentin not resumed for now. -  Hold metoprolol for now - PT eval - Appears mildly dehydrated, N/s + 20 KCL 100cc/hrx 15 hrs. - Please consult neurology in the morning - Seizure precautions - Ad[facility] to [facility], no MRI available at [facility] till 5/4.   Diabetes mellitus-random glucose 137, recent A1c 7.1. -Resume home Lantus at reduced dose 7 units nightly - SSI- S -Resume home Tradjenta  Hypertension-elevated systolic up to 614, has not taking home medications today.  Daughter unaware if blood pressure is controlled at baseline. -Resume home metoprolol, lisinopril, Imdur -IV hydralazine 5 mg given with improvement in blood pressure, systolic now 431V, continue PRN dosing of IV hydralazine.  Dementia- per daughter, dementia is progressing, but at baseline able to hold a conversation, recognizes family, independent of all ADLs, ambulates without assistance or assistive devices. -Resume home donepezil, Zoloft, Namenda.   DVT prophylaxis: *** Code Status: *** Family Communication: *** Disposition Plan: *** 1.  Where the patient is from 2.  Anticipated d/c place. 3.  Barriers to d/c OR conditions which need to be met to effect a safe d/c. If patient is medically stable, please make sure this is clear: Patient is medically stable for discharge."    Consultants:  ***  Procedures/Significant Events:  ***  I have personally reviewed and interpreted all radiology studies and my findings are as above.  VENTILATOR SETTINGS: ***   Cultures ***  Antimicrobials: ***   Devices ***   LINES / TUBES:  ***    Continuous Infusions: . 0.9 % NaCl with KCl 20 mEq / L 100 mL/hr at 09/26/19 2107     Objective: Vitals:   09/26/19 2350 09/27/19 0206 09/27/19 0641 09/27/19 0739  BP: (!) 181/78 136/66 131/70   Pulse: 67 66 71   Resp: 18 19 20    Temp: 97.6 F (36.4 C) 98.3 F (36.8 C) 98.1 F (36.7 C)   TempSrc: Oral Oral Oral   SpO2: 97% 93% 97% 95%  Weight: 76.8 kg     Height: 5' 2"  (1.575 m)       Intake/Output Summary (Last 24 hours) at 09/27/2019 0751 Last data filed at 09/27/2019 0600 Gross per 24 hour  Intake 576.61 ml  Output 400 ml  Net 176.61 ml   Filed Weights   09/26/19 1357 09/26/19 2350   Weight: 77.1 kg 76.8 kg    Examination:  General: No acute respiratory distress Eyes: negative scleral hemorrhage, negative anisocoria, negative icterus*** ENT: Negative Runny nose, negative gingival bleeding,*** Neck:  Negative scars, masses, torticollis, lymphadenopathy, JVD*** Lungs: Clear to auscultation bilaterally without wheezes or crackles Cardiovascular: Regular rate and rhythm without murmur gallop or rub normal S1 and S2 Abdomen: negative abdominal pain, nondistended, positive soft, bowel sounds, no rebound, no ascites, no appreciable mass Extremities: No significant cyanosis, clubbing, or edema bilateral lower extremities Skin: Negative rashes, lesions, ulcers*** Psychiatric:  Negative depression, negative anxiety, negative fatigue, negative mania *** Central nervous system:  Cranial nerves II through XII intact, tongue/uvula midline, all extremities muscle strength 5/5, sensation intact throughout, finger nose finger bilateral within normal limits, quick finger touch bilateral within normal limits, negative Romberg sign, heel to shin bilateral within normal limits, standing on 1 foot bilateral within normal limits, walking on tiptoes within normal limits, walking on heels within normal limits, negative dysarthria, negative expressive aphasia, negative receptive aphasia.***  .     Data Reviewed: Care during the described time interval was provided by me .  I have reviewed this patient's available data, including medical history, events  of note, physical examination, and all test results as part of my evaluation.  CBC: Recent Labs  Lab 09/26/19 1425  WBC 10.1  NEUTROABS 7.9*  HGB 13.5  HCT 42.2  MCV 95.9  PLT 235   Basic Metabolic Panel: Recent Labs  Lab 09/26/19 1412 09/26/19 1425  NA  --  138  K  --  3.8  CL  --  103  CO2  --  26  GLUCOSE  --  137*  BUN  --  14  CREATININE  --  0.88  CALCIUM  --  8.9  MG 1.9  --   PHOS 3.1  --    GFR:  Estimated Creatinine Clearance: 48.1 mL/min (by C-G formula based on SCr of 0.88 mg/dL). Liver Function Tests: Recent Labs  Lab 09/26/19 1425  AST 21  ALT 14  ALKPHOS 56  BILITOT 0.7  PROT 6.8  ALBUMIN 3.9   No results for input(s): LIPASE, AMYLASE in the last 168 hours. No results for input(s): AMMONIA in the last 168 hours. Coagulation Profile: No results for input(s): INR, PROTIME in the last 168 hours. Cardiac Enzymes: Recent Labs  Lab 09/26/19 1412  CKTOTAL 46   BNP (last 3 results) No results for input(s): PROBNP in the last 8760 hours. HbA1C: No results for input(s): HGBA1C in the last 72 hours. CBG: Recent Labs  Lab 09/27/19 0121  GLUCAP 185*   Lipid Profile: No results for input(s): CHOL, HDL, LDLCALC, TRIG, CHOLHDL, LDLDIRECT in the last 72 hours. Thyroid Function Tests: Recent Labs    09/26/19 1813  TSH 3.187   Anemia Panel: No results for input(s): VITAMINB12, FOLATE, FERRITIN, TIBC, IRON, RETICCTPCT in the last 72 hours. Sepsis Labs: No results for input(s): PROCALCITON, LATICACIDVEN in the last 168 hours.  No results found for this or any previous visit (from the past 240 hour(s)).       Radiology Studies: DG Chest 1 View  Result Date: 09/26/2019 CLINICAL DATA:  Pain status post fall EXAM: CHEST  1 VIEW COMPARISON:  None. FINDINGS: The heart size and mediastinal contours are within normal limits. Both lungs are clear. The visualized skeletal structures are unremarkable. IMPRESSION: No active disease. Electronically Signed   By: Constance Holster M.D.   On: 09/26/2019 15:09   DG Shoulder Right  Result Date: 09/26/2019  CLINICAL DATA:  Pain status post fall EXAM: RIGHT SHOULDER - 2+ VIEW COMPARISON:  None. FINDINGS: There is no evidence of fracture or dislocation. There is no evidence of arthropathy or other focal bone abnormality. Soft tissues are unremarkable. IMPRESSION: Negative. Electronically Signed   By: Constance Holster M.D.   On: 09/26/2019 15:06   CT HEAD WO CONTRAST  Result Date: 09/26/2019 CLINICAL DATA:  Fall x2 today. EXAM: CT HEAD WITHOUT CONTRAST CT CERVICAL SPINE WITHOUT CONTRAST TECHNIQUE: Multidetector CT imaging of the head and cervical spine was performed following the standard protocol without intravenous contrast. Multiplanar CT image reconstructions of the cervical spine were also generated. COMPARISON:  CT head 08/18/2019 FINDINGS: CT HEAD FINDINGS Brain: Generalized atrophy without hydrocephalus. Negative for acute infarct, hemorrhage, mass. Vascular: Negative for hyperdense vessel Skull: Negative for skull fracture Sinuses/Orbits: Paranasal sinuses clear. Bilateral cataract extraction. Other: None CT CERVICAL SPINE FINDINGS Alignment: Mild anterolisthesis C3-4 and C4-5 and C5-6. Skull base and vertebrae: Negative for fracture Soft tissues and spinal canal: No soft tissue mass or edema. Atherosclerotic calcification carotid bifurcation bilaterally. Disc levels: Disc degeneration and facet degeneration in the cervical spine at multiple levels.  No significant spinal stenosis. Upper chest: Lung apices clear bilaterally. Other: None IMPRESSION: 1. No acute intracranial abnormality.  Generalized atrophy 2. Cervical spine degenerative change.  Negative for fracture. Electronically Signed   By: Franchot Gallo M.D.   On: 09/26/2019 15:42   CT Cervical Spine Wo Contrast  Result Date: 09/26/2019  CLINICAL DATA:  Fall x2 today. EXAM: CT HEAD WITHOUT CONTRAST CT CERVICAL SPINE WITHOUT CONTRAST TECHNIQUE: Multidetector CT imaging of the head and cervical spine was performed following the standard protocol without intravenous contrast. Multiplanar CT image reconstructions of the cervical spine were also generated. COMPARISON:  CT head 08/18/2019 FINDINGS: CT HEAD FINDINGS Brain: Generalized atrophy without hydrocephalus. Negative for acute infarct, hemorrhage, mass. Vascular: Negative for hyperdense vessel Skull: Negative for skull fracture Sinuses/Orbits: Paranasal sinuses clear. Bilateral cataract extraction. Other: None CT CERVICAL SPINE FINDINGS Alignment: Mild anterolisthesis C3-4 and C4-5 and C5-6. Skull base and vertebrae: Negative for fracture Soft tissues and spinal canal: No soft tissue mass or edema. Atherosclerotic calcification carotid bifurcation bilaterally. Disc levels: Disc degeneration and facet degeneration in the cervical spine at multiple levels. No significant spinal stenosis. Upper chest: Lung apices clear bilaterally. Other: None IMPRESSION: 1. No acute intracranial abnormality.  Generalized atrophy 2. Cervical spine degenerative change.  Negative for fracture. Electronically Signed   By: Franchot Gallo M.D.   On: 09/26/2019 15:42   CT Thoracic Spine Wo Contrast  Result Date: 09/26/2019  CLINICAL DATA:  Fall. EXAM: CT THORACIC SPINE WITHOUT CONTRAST TECHNIQUE: Multidetector CT images of the thoracic were obtained using the standard protocol without intravenous contrast. COMPARISON:  None. FINDINGS: Alignment: Normal alignment. Mild gentle kyphosis of the thoracic spine. Vertebrae: Negative for fracture Paraspinal and other soft tissues: No paraspinous mass or edema. Atherosclerotic calcification in the aorta and coronary arteries. Mild right lower lobe atelectasis. Remaining visualized lungs are clear Disc levels: Disc degeneration and Schmorl's nodes T7-8, T8-9, T10-11, T12-L1. No focal disc protrusion or spinal stenosis. IMPRESSION: Negative for thoracic spine fracture. Coronary artery calcification Aortic Atherosclerosis (ICD10-I70.0). Electronically Signed   By: Franchot Gallo M.D.   On: 09/26/2019 15:45   CT Lumbar Spine Wo Contrast  Result Date: 09/26/2019  CLINICAL DATA:  Fall x2 today.  Dementia. EXAM: CT LUMBAR SPINE WITHOUT CONTRAST TECHNIQUE: Multidetector CT imaging of the lumbar spine was performed without intravenous contrast administration. Multiplanar CT image reconstructions were also generated. COMPARISON:  None. FINDINGS: Segmentation: Normal Alignment: Slight retrolisthesis L1-2. Vertebrae: Negative for fracture or mass Paraspinal and other soft tissues: Atherosclerotic aorta without aneurysm. No paraspinous mass or adenopathy. Disc levels: T12-L1: Disc degeneration and spurring without significant stenosis. L1-2: Disc degeneration and mild spurring without stenosis L2-3: Mild disc degeneration L3-4: Mild disc degeneration. Bilateral facet hypertrophy without significant stenosis L4-5: Moderate disc degeneration and moderate to severe facet degeneration. Moderate spinal stenosis and moderate subarticular stenosis bilaterally L5-S1: Bilateral facet degeneration with moderate subarticular stenosis bilaterally. IMPRESSION: 1. Negative for lumbar fracture 2. Lumbar degenerative changes as above. Electronically Signed   By: Franchot Gallo M.D.   On: 09/26/2019 15:29   DG Humerus Right  Result Date: 09/26/2019 CLINICAL DATA:  Pain status post fall EXAM: RIGHT HUMERUS - 2+ VIEW COMPARISON:  None. FINDINGS: There is no acute displaced fracture or dislocation. There is soft tissue swelling about the right upper extremity. The osseous mineralization is decreased. There is no unexpected radiopaque foreign body. IMPRESSION: 1. No acute displaced fracture or dislocation. 2. Soft tissue swelling about the right upper extremity. Electronically Signed  By: Constance Holster M.D.   On: 09/26/2019 15:06   DG Hips Bilat W or Wo Pelvis 3-4 Views  Result Date: 09/26/2019  CLINICAL DATA:  Pain status post fall EXAM: DG HIP (WITH OR WITHOUT PELVIS) 3-4V BILAT COMPARISON:  None. FINDINGS: There are moderate degenerative changes of both hips. There is no definite acute displaced fracture or dislocation. The osseous mineralization is decreased. IMPRESSION: Negative. Electronically Signed   By: Constance Holster M.D.   On: 09/26/2019 15:07        Scheduled Meds: . aspirin EC  81 mg Oral Daily  . donepezil  10 mg Oral Daily  . insulin aspart  0-5 Units Subcutaneous QHS  . insulin aspart  0-9 Units Subcutaneous TID WC  . insulin glargine  8 Units Subcutaneous QHS  . isosorbide mononitrate  60 mg Oral Daily  . linagliptin  5 mg Oral Daily  . lisinopril  20 mg Oral Daily  . memantine  10 mg Oral BID  . sertraline  100 mg Oral Daily   Continuous Infusions: . 0.9 % NaCl with KCl 20 mEq / L 100 mL/hr at 09/26/19 2107     LOS: 0 days    Time spent:40 min***    Branston Halsted, Geraldo Docker, MD [facility] Pager [phone]  If 7PM-7AM, please contact night-coverage [email or web address] Password [facility] 09/27/2019, 7:51 AM

## 2019-09-27 NOTE — Procedures (Signed)
Patient Name: Misty Price  MRN: [id number]  Epilepsy Attending: Charlsie Quest  Referring Physician/Provider: Dr. Carolyne Littles Date: 09/27/2019 Duration: 26.15 minutes  Patient history: 84 year old female with syncope.  EEG to evaluate for seizures.  Level of alertness: Awake, asleep  AEDs during EEG study: None  Technical aspects: This EEG study was done with scalp electrodes positioned according to the 10-20 International system of electrode placement. Electrical activity was acquired at a sampling rate of 500Hz  and reviewed with a high frequency filter of 70Hz  and a low frequency filter of 1Hz . EEG data were recorded continuously and digitally stored.   Description: The posterior dominant rhythm consists of 9-10 Hz activity of moderate voltage (25-35 uV) seen predominantly in posterior head regions, symmetric and reactive to eye opening and eye closing.  Sleep was characterized by vertex waves, sleep spindles (12 to 14 Hz), maximal frontocentral region.  Physiologic photic driving was seen during photic stimulation.  Hyperventilation was not performed.  IMPRESSION: This study is within normal limits. No seizures or epileptiform discharges were seen throughout the recording.  Hani Campusano 

## 2019-09-27 NOTE — Care Management Obs Status (Signed)
MEDICARE OBSERVATION STATUS NOTIFICATION   Patient Details  Name: Misty Price MRN: [id number] Date of Birth: 10-Jul-1935   Medicare Observation Status Notification Given:  Yes    Corey Harold 09/27/2019, 4:01 PM

## 2019-09-27 NOTE — Consult Note (Signed)
[facility] Raissa Dam A. Gerilyn Pilgrim, MD     [email or web address]          Misty Price is an 84 y.o. female.   ASSESSMENT/PLAN: 1. RECURRENT EPISODES OF SYNCOPE UNCLEAR ETIOLOGY BUT I SUSPECT IT IS DUE TO VASOVAGAL ETIOLOGY.   Workup has not reveal a cardiac etiology. The semiology and normal EEG argues against seizures. The patient does have diabetes and is at risk of having orthostatic hypotension. This should be vigorously evaluated to see if this could be a potential etiology. Consequently, orthostatics are recommended. 2. RECURRENT FALLS LIKELY DUE TO  THE ABOVE 3. Baseline cognitive impairment     The patient is an 84 year old white female who presents with the recurrent episode of blackout spells and falling. Patient's daughter is present at the  Bedside and the helps with history. The patient has had these episodes of unresponsiveness / dozing under various situations. She was worked up in the hospital here about a month and a half ago per the daughter because having a couple of those events. It appears that the events about a month and a half ago were associated with the patient going to the restroom. She has had other events when she is sitting and apparently under psychosocial stresses.  Daughter describes the spell as the patient closing her eyes and becoming unresponsive. These may be different that the spell when she is up walking around when she apparently falls to the ground it may be passes out. It appears that most of the events however occur when she is up walking around after standing from a sitting or lying position. No focal deficits are reported. No shortness of breath or chest pain. No convulsions. The review systems otherwise negative.    GENERAL:  This is a pleasant female who is doing well at this time.  HEENT:  The neck is supple no trauma appreciated.  ABDOMEN: soft  EXTREMITIES: No edema;  Significant arthritic changes at the knees where she is status  post bilateral total knee replacement.   BACK: Normal  SKIN: Normal by inspection.    MENTAL STATUS:  She is awake and alert. She cooperates with the evaluation. Speech is normal. She is oriented to hospital and the month but not the year. She follows commands well.  CRANIAL NERVES: Pupils are equal, round and reactive to light and accomodation; extra ocular movements are full, there is no significant nystagmus; visual fields are full; upper and lower facial muscles are normal in strength and symmetric, there is no flattening of the nasolabial folds; tongue is midline; uvula is midline; shoulder elevation is normal.  MOTOR:  Strength is 4/5 throughout. Bulk and tone are normal throughout.  COORDINATION: Left finger to nose is normal, right finger to nose is normal, No rest tremor; no intention tremor; no postural tremor; no bradykinesia.  REFLEXES: Deep tendon reflexes are symmetrical and normal.    SENSATION: Normal to light touch, temperature, and pain.       Blood pressure 131/70, pulse 71, temperature 98.1 F (36.7 C), temperature source Oral, resp. rate 20, height 5\' 2"  (1.575 m), weight 76.8 kg, SpO2 95 %.  Past Medical History:  Diagnosis Date  . Dementia (HCC)   . Diabetes mellitus without complication (HCC)   . Syncopal episodes     Past Surgical History:  Procedure Laterality Date  . JOINT REPLACEMENT     bilateral knee replacement    History reviewed. No pertinent family history.  Social History:  reports that she  has quit smoking. She has never used smokeless tobacco. No history on file for alcohol and drug.  Allergies:  Allergies  Allergen Reactions  . Keppra [Levetiracetam]     Daughter states pt was "out of it" after taking it about 2 or 3 times    Medications: Prior to Admission medications   Medication Sig Start Date End Date Taking? Authorizing Provider  aspirin EC 81 MG tablet Take 81 mg by mouth daily.   Yes [provider]  donepezil  (ARICEPT) 10 MG tablet Take 10 mg by mouth daily.   Yes [provider]  gabapentin (NEURONTIN) 300 MG capsule Take 300 mg by mouth at bedtime.   Yes [provider]  insulin glargine (LANTUS SOLOSTAR) 100 UNIT/ML Solostar Pen Inject 15 Units into the skin at bedtime.    Yes [provider]  isosorbide mononitrate (IMDUR) 60 MG 24 hr tablet Take 60 mg by mouth daily.   Yes [provider]  linagliptin (TRADJENTA) 5 MG TABS tablet Take 5 mg by mouth daily.   Yes [provider]  lisinopril (ZESTRIL) 20 MG tablet Take 20 mg by mouth daily.   Yes [provider]  memantine (NAMENDA) 10 MG tablet Take 10 mg by mouth 2 (two) times daily.   Yes [provider]  metoprolol tartrate (LOPRESSOR) 50 MG tablet Take 50 mg by mouth 2 (two) times daily.   Yes [provider]  sertraline (ZOLOFT) 100 MG tablet Take 100 mg by mouth daily.   Yes [provider]    Scheduled Meds: . aspirin EC  81 mg Oral Daily  . donepezil  10 mg Oral Daily  . insulin aspart  0-5 Units Subcutaneous QHS  . insulin aspart  0-9 Units Subcutaneous TID WC  . insulin glargine  8 Units Subcutaneous QHS  . isosorbide mononitrate  60 mg Oral Daily  . linagliptin  5 mg Oral Daily  . lisinopril  20 mg Oral Daily  . memantine  10 mg Oral BID  . sertraline  100 mg Oral Daily   Continuous Infusions: PRN Meds:.acetaminophen **OR** acetaminophen, hydrALAZINE, ondansetron **OR** ondansetron (ZOFRAN) IV, polyethylene glycol     Results for orders placed or performed during the hospital encounter of 09/26/19 (from the past 48 hour(s))  Magnesium     Status: None   Collection Time: 09/26/19  2:12 PM  Result Value Ref Range   Magnesium 1.9 1.7 - 2.4 mg/dL    Comment: Performed at [facility], [address] [place], [place] [address]  Phosphorus     Status: None   Collection Time: 09/26/19  2:12 PM  Result Value Ref Range   Phosphorus 3.1 2.5 - 4.6  mg/dL    Comment: Performed at [facility], [address], [place], [place] [address]  CK     Status: None   Collection Time: 09/26/19  2:12 PM  Result Value Ref Range   Total CK 46 38 - 234 U/L    Comment: Performed at [facility], [address], [place], [place] [address]  CBC with Differential/Platelet     Status: Abnormal   Collection Time: 09/26/19  2:25 PM  Result Value Ref Range   WBC 10.1 4.0 - 10.5 K/uL   RBC 4.40 3.87 - 5.11 MIL/uL   Hemoglobin 13.5 12.0 - 15.0 g/dL   HCT 32.6 71.2 - 45.8 %   MCV 95.9 80.0 - 100.0 fL   MCH 30.7 26.0 - 34.0 pg   MCHC  32.0 30.0 - 36.0 g/dL   RDW 11.913.4 14.711.5 - 82.915.5 %   Platelets 153 150 - 400 K/uL   nRBC 0.0 0.0 - 0.2 %   Neutrophils Relative % 78 %   Neutro Abs 7.9 (H) 1.7 - 7.7 K/uL   Lymphocytes Relative 15 %   Lymphs Abs 1.5 0.7 - 4.0 K/uL   Monocytes Relative 6 %   Monocytes Absolute 0.6 0.1 - 1.0 K/uL   Eosinophils Relative 1 %   Eosinophils Absolute 0.1 0.0 - 0.5 K/uL   Basophils Relative 0 %   Basophils Absolute 0.0 0.0 - 0.1 K/uL   Immature Granulocytes 0 %   Abs Immature Granulocytes 0.04 0.00 - 0.07 K/uL    Comment: Performed at [facility], [address] [place], [place] [address]  Comprehensive metabolic panel     Status: Abnormal   Collection Time: 09/26/19  2:25 PM  Result Value Ref Range   Sodium 138 135 - 145 mmol/L   Potassium 3.8 3.5 - 5.1 mmol/L   Chloride 103 98 - 111 mmol/L   CO2 26 22 - 32 mmol/L   Glucose, Bld 137 (H) 70 - 99 mg/dL    Comment: Glucose reference range applies only to samples taken after fasting for at least 8 hours.   BUN 14 8 - 23 mg/dL   Creatinine, Ser 0.860.88 0.44 - 1.00 mg/dL   Calcium 8.9 8.9 - 57.810.3 mg/dL   Total Protein 6.8 6.5 - 8.1 g/dL   Albumin 3.9 3.5 - 5.0 g/dL   AST 21 15 - 41 U/L   ALT 14 0 - 44 U/L   Alkaline Phosphatase 56 38 - 126 U/L   Total Bilirubin 0.7 0.3 - 1.2 mg/dL   GFR calc non Af Amer >60 >60 mL/min   GFR calc Af Amer >60 >60 mL/min   Anion gap 9 5 - 15     Comment: Performed at [facility], [address] [place], [place] [address]  Urinalysis, Routine w reflex microscopic     Status: Abnormal   Collection Time: 09/26/19  2:27 PM  Result Value Ref Range   Color, Urine STRAW (A) YELLOW   APPearance CLEAR CLEAR   Specific Gravity, Urine 1.011 1.005 - 1.030   pH 7.0 5.0 - 8.0   Glucose, UA NEGATIVE NEGATIVE mg/dL   Hgb urine dipstick SMALL (A) NEGATIVE   Bilirubin Urine NEGATIVE NEGATIVE   Ketones, ur 5 (A) NEGATIVE mg/dL   Protein, ur NEGATIVE NEGATIVE mg/dL   Nitrite NEGATIVE NEGATIVE   Leukocytes,Ua NEGATIVE NEGATIVE   RBC / HPF 0-5 0 - 5 RBC/hpf   WBC, UA 0-5 0 - 5 WBC/hpf   Bacteria, UA NONE SEEN NONE SEEN   Squamous Epithelial / LPF 0-5 0 - 5    Comment: Performed at [facility], [address] [place], [place] [address]  SARS CORONAVIRUS 2 (TAT 6-24 HRS) Nasopharyngeal Nasopharyngeal Swab     Status: None   Collection Time: 09/26/19  4:46 PM   Specimen: Nasopharyngeal Swab  Result Value Ref Range   SARS Coronavirus 2 NEGATIVE NEGATIVE    Comment: (NOTE) SARS-CoV-2 target nucleic acids are NOT DETECTED. The SARS-CoV-2 RNA is generally detectable in upper and lower respiratory specimens during the acute phase of infection. Negative results do not preclude SARS-CoV-2 infection, do not rule out co-infections with other pathogens, and should not be used as the sole basis for treatment or other patient management decisions. Negative results must be combined with clinical  observations, patient history, and epidemiological information. The expected result is Negative. Fact Sheet for Patients: HairSlick.no Fact Sheet for Healthcare Providers: quierodirigir.com This test is not yet approved or cleared by the [place] FDA and  has been authorized for detection and/or diagnosis of SARS-CoV-2 by FDA under an Emergency Use Authorization (EUA). This EUA will remain  in effect  (meaning this test can be used) for the duration of the COVID-19 declaration under Section 56 4(b)(1) of the Act, 21 U.S.C. section 360bbb-3(b)(1), unless the authorization is terminated or revoked sooner. Performed at [facility], [address], [place], [place] [address]   TSH     Status: None   Collection Time: 09/26/19  6:13 PM  Result Value Ref Range   TSH 3.187 0.350 - 4.500 uIU/mL    Comment: Performed by a 3rd Generation assay with a functional sensitivity of <=0.01 uIU/mL. Performed at [facility], [address], [place], [place] [address]   Glucose, capillary     Status: Abnormal   Collection Time: 09/27/19  1:21 AM  Result Value Ref Range   Glucose-Capillary 185 (H) 70 - 99 mg/dL    Comment: Glucose reference range applies only to samples taken after fasting for at least 8 hours.  Comprehensive metabolic panel     Status: Abnormal   Collection Time: 09/27/19  8:09 AM  Result Value Ref Range   Sodium 140 135 - 145 mmol/L   Potassium 3.7 3.5 - 5.1 mmol/L   Chloride 106 98 - 111 mmol/L   CO2 25 22 - 32 mmol/L   Glucose, Bld 95 70 - 99 mg/dL    Comment: Glucose reference range applies only to samples taken after fasting for at least 8 hours.   BUN 13 8 - 23 mg/dL   Creatinine, Ser 6.75 0.44 - 1.00 mg/dL   Calcium 8.5 (L) 8.9 - 10.3 mg/dL   Total Protein 5.7 (L) 6.5 - 8.1 g/dL   Albumin 3.2 (L) 3.5 - 5.0 g/dL   AST 22 15 - 41 U/L   ALT 12 0 - 44 U/L   Alkaline Phosphatase 45 38 - 126 U/L   Total Bilirubin 0.8 0.3 - 1.2 mg/dL   GFR calc non Af Amer >60 >60 mL/min   GFR calc Af Amer >60 >60 mL/min   Anion gap 9 5 - 15    Comment: Performed at [facility], [address], [place], [place] [address]  Magnesium     Status: None   Collection Time: 09/27/19  8:09 AM  Result Value Ref Range   Magnesium 1.8 1.7 - 2.4 mg/dL    Comment: Performed at [facility], [address], [place], [place] [address]  Phosphorus     Status: None   Collection Time: 09/27/19  8:09  AM  Result Value Ref Range   Phosphorus 3.3 2.5 - 4.6 mg/dL    Comment: Performed at [facility], [address] [place], [place] [address]  CBC with Differential/Platelet     Status: Abnormal   Collection Time: 09/27/19  8:09 AM  Result Value Ref Range   WBC 7.2 4.0 - 10.5 K/uL   RBC 3.82 (L) 3.87 - 5.11 MIL/uL   Hemoglobin 11.9 (L) 12.0 - 15.0 g/dL   HCT 17.7 93.9 - 03.0 %   MCV 95.3 80.0 - 100.0 fL   MCH 31.2 26.0 - 34.0 pg   MCHC 32.7 30.0 - 36.0 g/dL   RDW 09.2 33.0 - 07.6 %  Platelets 148 (L) 150 - 400 K/uL   nRBC 0.0 0.0 - 0.2 %   Neutrophils Relative % 62 %   Neutro Abs 4.5 1.7 - 7.7 K/uL   Lymphocytes Relative 26 %   Lymphs Abs 1.9 0.7 - 4.0 K/uL   Monocytes Relative 11 %   Monocytes Absolute 0.8 0.1 - 1.0 K/uL   Eosinophils Relative 1 %   Eosinophils Absolute 0.1 0.0 - 0.5 K/uL   Basophils Relative 0 %   Basophils Absolute 0.0 0.0 - 0.1 K/uL   Immature Granulocytes 0 %   Abs Immature Granulocytes 0.01 0.00 - 0.07 K/uL    Comment: Performed at [facility], [address], [place], [place] [address]  Glucose, capillary     Status: None   Collection Time: 09/27/19  8:16 AM  Result Value Ref Range   Glucose-Capillary 81 70 - 99 mg/dL    Comment: Glucose reference range applies only to samples taken after fasting for at least 8 hours.  Glucose, capillary     Status: Abnormal   Collection Time: 09/27/19 11:15 AM  Result Value Ref Range   Glucose-Capillary 107 (H) 70 - 99 mg/dL    Comment: Glucose reference range applies only to samples taken after fasting for at least 8 hours.    Studies/Results:   EEG Description: The posterior dominant rhythm consists of 9-10 Hz activity of moderate voltage (25-35 uV) seen predominantly in posterior head regions, symmetric and reactive to eye opening and eye closing.  Sleep was characterized by vertex waves, sleep spindles (12 to 14 Hz), maximal frontocentral region.  Physiologic photic driving was seen during photic stimulation.   Hyperventilation was not performed.  IMPRESSION: This study is within normal limits. No seizures or epileptiform discharges were seen throughout the recording.    The brain MRI scan is reviewed in person. No acute changes are noted on DWI. No hemorrhages appreciated. 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 There is mild global atrophy. The scan overall appears normal for age.       Aamira Bischoff A. Merlene Laughter, M.D.  Diplomate, Tax adviser of Psychiatry and Neurology ( Neurology). 09/27/2019, 6:41 PM

## 2019-09-27 NOTE — Evaluation (Signed)
Physical Therapy Evaluation Patient Details Name: Misty Price MRN: [id number] DOB: 1935/08/08 Today's Date: 09/27/2019   History of Present Illness  Misty Price is a 84 y.o. female with medical history significant for dementia, diabetes mellitus, syncope.  History is obtained from daughter- [name] who is present at bedside, as at the time of my evaluation patient was awake has dementia unable to give me history, she is also upset about being admitted to the hospital.Patient was brought to the ED by daughter with reports of a fall 2 times today.  At about 11 AM this morning, patient fell in the bathroom, between the toilet and the bathtub, she hit her head when she fell.  Within an hour she got off of her bed and was walked to the bathroom and then fell again.  Both falls were unwitnessed.  Patient was in the ED 08/18/19-reports of 3 syncopal episodes, all 3 were witnessed.  They all occurred in the mornings, when patient was sitting on a chair or toilet.  Patient has not had any subsequent episodes of falls or passing out since then.No twitching, or jerking of extremities noticed.As far as daughter knows, patient has no history of seizures, no complaints of headache, no vomiting no loose stools has chronic poor p.o. intake which is unchanged, no fevers or chills.  Daughter reports a history of vertigo and is unsure if this is related.    Clinical Impression  Patient functioning near baseline for functional mobility and gait, had lean on nearby objects for support when attempting ambulation without AD, required use of RW for safety and demonstrates slightly labored cadence without loss of balance, no c/o dizziness and tolerated sitting up in chair to eat breakfast with her daughter present in room after therapy - RN notified.  Patient will benefit from continued physical therapy in hospital and recommended venue below to increase strength, balance, endurance for safe ADLs and gait.     Follow Up  Recommendations Home health PT;Supervision for mobility/OOB;Supervision/Assistance - 24 hour    Equipment Recommendations  None recommended by PT    Recommendations for Other Services       Precautions / Restrictions Precautions Precautions: Fall Precaution Comments: syncopal episodes Restrictions Weight Bearing Restrictions: No      Mobility  Bed Mobility Overal bed mobility: Needs Assistance Bed Mobility: Supine to Sit     Supine to sit: Supervision     General bed mobility comments: increased time, slightly labored movement  Transfers Overall transfer level: Needs assistance Equipment used: Rolling walker (2 wheeled);None Transfers: Sit to/from [employer] to Stand: Supervision;Min guard Stand pivot transfers: Supervision;Min guard       General transfer comment: has to lean on nearby objects for support without AD, required use of RW for safety  Ambulation/Gait Ambulation/Gait assistance: Supervision;Min guard Gait Distance (Feet): 50 Feet Assistive device: Rolling walker (2 wheeled) Gait Pattern/deviations: Decreased step length - right;Decreased step length - left;Decreased stride length Gait velocity: slightly decreased   General Gait Details: has to lean on nearby objects for support when attempting ambulation without AD, safer using RW demonstrating slightly labored cadence without loss of balance, limited secondary to fatigue  Stairs            Wheelchair Mobility    Modified Rankin (Stroke Patients Only)       Balance Overall balance assessment: Needs assistance Sitting-balance support: Feet supported;No upper extremity supported Sitting balance-Leahy Scale: Good Sitting balance - Comments: seated at EOB   Standing balance  support: During functional activity;No upper extremity supported Standing balance-Leahy Scale: Poor Standing balance comment: fair using RW                             Pertinent  Vitals/Pain Pain Assessment: No/denies pain    Home Living Family/patient expects to be discharged to:: Private residence Living Arrangements: Children Available Help at Discharge: Family;Available 24 hours/day Type of Home: House Home Access: Stairs to enter Entrance Stairs-Rails: Right;Left;Can reach both Entrance Stairs-Number of Steps: 2 Home Layout: One level Home Equipment: Walker - 2 wheels;Cane - single point      Prior Function Level of Independence: Needs assistance   Gait / Transfers Assistance Needed: household ambulator without AD, uses RW PRN  ADL's / Homemaking Assistance Needed: assisted by family        Hand Dominance        Extremity/Trunk Assessment   Upper Extremity Assessment Upper Extremity Assessment: Generalized weakness    Lower Extremity Assessment Lower Extremity Assessment: Generalized weakness    Cervical / Trunk Assessment Cervical / Trunk Assessment: Normal  Communication   Communication: HOH  Cognition Arousal/Alertness: Awake/alert Behavior During Therapy: WFL for tasks assessed/performed Overall Cognitive Status: History of cognitive impairments - at baseline                                        General Comments      Exercises     Assessment/Plan    PT Assessment Patient needs continued PT services  PT Problem List Decreased strength;Decreased activity tolerance;Decreased balance;Decreased mobility       PT Treatment Interventions Balance training;Gait training;Stair training;Functional mobility training;Therapeutic activities;Patient/family education;Therapeutic exercise    PT Goals (Current goals can be found in the Care Plan section)  Acute Rehab PT Goals Patient Stated Goal: return home with family to assist PT Goal Formulation: With patient/family Time For Goal Achievement: 09/30/19 Potential to Achieve Goals: Good    Frequency Min 3X/week   Barriers to discharge        Co-evaluation                AM-PAC PT "6 Clicks" Mobility  Outcome Measure Help needed turning from your back to your side while in a flat bed without using bedrails?: None Help needed moving from lying on your back to sitting on the side of a flat bed without using bedrails?: A Little Help needed moving to and from a bed to a chair (including a wheelchair)?: A Little Help needed standing up from a chair using your arms (e.g., wheelchair or bedside chair)?: A Little Help needed to walk in hospital room?: A Little Help needed climbing 3-5 steps with a railing? : A Lot 6 Click Score: 18    End of Session   Activity Tolerance: Patient tolerated treatment well;Patient limited by fatigue Patient left: in chair;with call bell/phone within reach;with chair alarm set;with family/visitor present Nurse Communication: Mobility status PT Visit Diagnosis: Unsteadiness on feet (R26.81);Other abnormalities of gait and mobility (R26.89);Muscle weakness (generalized) (M62.81)    Time: 2355-7322 PT Time Calculation (min) (ACUTE ONLY): 29 min   Charges:   PT Evaluation $PT Eval Moderate Complexity: 1 Mod PT Treatments $Therapeutic Activity: 23-37 mins        9:32 AM, 09/27/19 Ocie Bob, MPT Physical Therapist with [facility] [phone] office [phone] mobile  phone

## 2019-09-27 NOTE — Progress Notes (Signed)
Spoke with RN about bedside EEG this morning being done she said that pt should be available midmorning for EEG

## 2019-09-27 NOTE — Progress Notes (Signed)
Pt's glasses were left at [facility] in MRI. Talked with MRI tech there and gave address on file (per family) to have glasses mailed to home address.

## 2019-09-27 NOTE — Progress Notes (Signed)
*  PRELIMINARY RESULTS* Echocardiogram 2D Echocardiogram has been performed.  Stacey Drain 09/27/2019, 2:51 PM

## 2019-09-27 NOTE — Progress Notes (Signed)
PROGRESS NOTE   Misty Price  PHX:[id number] DOB: 1936-01-19 DOA: 09/26/2019 PCP: Benita Stabile, MD   Chief Complaint  Patient presents with   Fall    Brief Narrative:  84 y.o. female with medical history significant for dementia, diabetes mellitus, syncope.   History is obtained from daughterLynden Price who is present at bedside, as at the time of my evaluation patient was awake has dementia unable to give me history, she is also upset about being admitted to the hospital.  Patient moved from [name] about 3 months ago.   She has been able to establish care with a primary care provider here.  Daughter is not aware if patient's blood pressure chronically runs high.  Patient ran out of her gabapentin about a month ago, but when she came to the ED in March she was still compliant with it.  Assessment & Plan:   Principal Problem:   Fall Active Problems:   Diabetes mellitus (HCC)   Dementia (HCC)   Syncope   Diabetes mellitus type 2, controlled, with complications (HCC)   Benign essential HTN   1. Syncope and fall-patient is being evaluated for cardiac versus neurologic etiology however with her progressive dementia I worry that she is having spells related to worsening dementia.  We are anticipating an MRI study.  Unable to have MRI until tomorrow.  Follow-up EEG.  Monitor and follow-up mag Foss and TSH.  Prolactin pending.  Temporarily holding gabapentin PT evaluation pending.  I have asked for inpatient neurology consultation. 2. Type 2 diabetes mellitus-continue to monitor blood sugar and provide SSI coverage. 3. Essential hypertension-patient is had very elevated blood pressures.  We have added IV hydralazine and we have resumed her home metoprolol lisinopril and Imdur. 4. Dementia-family reports that disease has been progressing.  She is able to work with her at home and patient has been independent of ADLs recently.  She has been resumed on her home donezepil Zoloft and  Namenda.   DVT prophylaxis: SCDs Code Status: Full Family Communication: I spoke with daughter by telephone update Disposition:   Status is: Observation  The patient remains OBS appropriate and will d/c before 2 midnights.  Dispo: The patient is from: Home              Anticipated d/c is to: Home              Anticipated d/c date is: 1 day              Patient currently is not medically stable to d/c.  Consultants:   Inpatient neurology  Procedures:   MRI brain without contrast  Antimicrobials:     Subjective: Patient reports being upset about having to travel to [facility] for MRI.  Objective: Vitals:   09/26/19 2350 09/27/19 0206 09/27/19 0641 09/27/19 0739  BP: (!) 181/78 136/66 131/70   Pulse: 67 66 71   Resp: 18 19 20    Temp: 97.6 F (36.4 C) 98.3 F (36.8 C) 98.1 F (36.7 C)   TempSrc: Oral Oral Oral   SpO2: 97% 93% 97% 95%  Weight: 76.8 kg     Height: 5\' 2"  (1.575 m)       Intake/Output Summary (Last 24 hours) at 09/27/2019 1603 Last data filed at 09/27/2019 0830 Gross per 24 hour  Intake 816.61 ml  Output 400 ml  Net 416.61 ml   Filed Weights   09/26/19 1357 09/26/19 2350  Weight: 77.1 kg 76.8 kg    Examination:  General exam: Appears calm and comfortable  Respiratory system: Clear to auscultation. Respiratory effort normal. Cardiovascular system: S1 & S2 heard, RRR. No JVD, murmurs, rubs, gallops or clicks. No pedal edema. Gastrointestinal system: Abdomen is nondistended, soft and nontender. No organomegaly or masses felt. Normal bowel sounds heard. Central nervous system: Alert and oriented. No focal neurological deficits. Extremities: Symmetric 5 x 5 power. Skin: No rashes, lesions or ulcers Psychiatry: Judgement and insight appear normal. Mood & affect appropriate.     Data Reviewed: I have personally reviewed following labs and imaging studies  CBC: Recent Labs  Lab 09/26/19 1425 09/27/19 0809  WBC 10.1 7.2  NEUTROABS 7.9* 4.5   HGB 13.5 11.9*  HCT 42.2 36.4  MCV 95.9 95.3  PLT 153 148*    Basic Metabolic Panel: Recent Labs  Lab 09/26/19 1412 09/26/19 1425 09/27/19 0809  NA  --  138 140  K  --  3.8 3.7  CL  --  103 106  CO2  --  26 25  GLUCOSE  --  137* 95  BUN  --  14 13  CREATININE  --  0.88 0.85  CALCIUM  --  8.9 8.5*  MG 1.9  --  1.8  PHOS 3.1  --  3.3    GFR: Estimated Creatinine Clearance: 49.8 mL/min (by C-G formula based on SCr of 0.85 mg/dL).  Liver Function Tests: Recent Labs  Lab 09/26/19 1425 09/27/19 0809  AST 21 22  ALT 14 12  ALKPHOS 56 45  BILITOT 0.7 0.8  PROT 6.8 5.7*  ALBUMIN 3.9 3.2*    CBG: Recent Labs  Lab 09/27/19 0121 09/27/19 0816 09/27/19 1115  GLUCAP 185* 81 107*     Recent Results (from the past 240 hour(s))  SARS CORONAVIRUS 2 (TAT 6-24 HRS) Nasopharyngeal Nasopharyngeal Swab     Status: None   Collection Time: 09/26/19  4:46 PM   Specimen: Nasopharyngeal Swab  Result Value Ref Range Status   SARS Coronavirus 2 NEGATIVE NEGATIVE Final    Comment: (NOTE) SARS-CoV-2 target nucleic acids are NOT DETECTED. The SARS-CoV-2 RNA is generally detectable in upper and lower respiratory specimens during the acute phase of infection. Negative results do not preclude SARS-CoV-2 infection, do not rule out co-infections with other pathogens, and should not be used as the sole basis for treatment or other patient management decisions. Negative results must be combined with clinical observations, patient history, and epidemiological information. The expected result is Negative. Fact Sheet for Patients: SugarRoll.be Fact Sheet for Healthcare Providers: https://www.woods-mathews.com/ This test is not yet approved or cleared by the [place] FDA and  has been authorized for detection and/or diagnosis of SARS-CoV-2 by FDA under an Emergency Use Authorization (EUA). This EUA will remain  in effect (meaning this test  can be used) for the duration of the COVID-19 declaration under Section 56 4(b)(1) of the Act, 21 U.S.C. section 360bbb-3(b)(1), unless the authorization is terminated or revoked sooner. Performed at [facility], [place] [address] [place], [place] [address]          Radiology Studies: DG Chest 1 View  Result Date: 09/26/2019 CLINICAL DATA:  Pain status post fall EXAM: CHEST  1 VIEW COMPARISON:  None. FINDINGS: The heart size and mediastinal contours are within normal limits. Both lungs are clear. The visualized skeletal structures are unremarkable. IMPRESSION: No active disease. Electronically Signed   By: Constance Holster M.D.   On: 09/26/2019 15:09   DG Shoulder Right  Result Date:  09/26/2019 CLINICAL DATA:  Pain status post fall EXAM: RIGHT SHOULDER - 2+ VIEW COMPARISON:  None. FINDINGS: There is no evidence of fracture or dislocation. There is no evidence of arthropathy or other focal bone abnormality. Soft tissues are unremarkable. IMPRESSION: Negative. Electronically Signed   By: Katherine Mantle M.D.   On: 09/26/2019 15:06   CT HEAD WO CONTRAST  Result Date: 09/26/2019 CLINICAL DATA:  Fall x2 today. EXAM: CT HEAD WITHOUT CONTRAST CT CERVICAL SPINE WITHOUT CONTRAST TECHNIQUE: Multidetector CT imaging of the head and cervical spine was performed following the standard protocol without intravenous contrast. Multiplanar CT image reconstructions of the cervical spine were also generated. COMPARISON:  CT head 08/18/2019 FINDINGS: CT HEAD FINDINGS Brain: Generalized atrophy without hydrocephalus. Negative for acute infarct, hemorrhage, mass. Vascular: Negative for hyperdense vessel Skull: Negative for skull fracture Sinuses/Orbits: Paranasal sinuses clear. Bilateral cataract extraction. Other: None CT CERVICAL SPINE FINDINGS Alignment: Mild anterolisthesis C3-4 and C4-5 and C5-6. Skull base and vertebrae: Negative for fracture Soft tissues and spinal canal: No soft tissue mass or edema.  Atherosclerotic calcification carotid bifurcation bilaterally. Disc levels: Disc degeneration and facet degeneration in the cervical spine at multiple levels. No significant spinal stenosis. Upper chest: Lung apices clear bilaterally. Other: None IMPRESSION: 1. No acute intracranial abnormality.  Generalized atrophy 2. Cervical spine degenerative change.  Negative for fracture. Electronically Signed   By: Marlan Palau M.D.   On: 09/26/2019 15:42   CT Cervical Spine Wo Contrast  Result Date: 09/26/2019 CLINICAL DATA:  Fall x2 today. EXAM: CT HEAD WITHOUT CONTRAST CT CERVICAL SPINE WITHOUT CONTRAST TECHNIQUE: Multidetector CT imaging of the head and cervical spine was performed following the standard protocol without intravenous contrast. Multiplanar CT image reconstructions of the cervical spine were also generated. COMPARISON:  CT head 08/18/2019 FINDINGS: CT HEAD FINDINGS Brain: Generalized atrophy without hydrocephalus. Negative for acute infarct, hemorrhage, mass. Vascular: Negative for hyperdense vessel Skull: Negative for skull fracture Sinuses/Orbits: Paranasal sinuses clear. Bilateral cataract extraction. Other: None CT CERVICAL SPINE FINDINGS Alignment: Mild anterolisthesis C3-4 and C4-5 and C5-6. Skull base and vertebrae: Negative for fracture Soft tissues and spinal canal: No soft tissue mass or edema. Atherosclerotic calcification carotid bifurcation bilaterally. Disc levels: Disc degeneration and facet degeneration in the cervical spine at multiple levels. No significant spinal stenosis. Upper chest: Lung apices clear bilaterally. Other: None IMPRESSION: 1. No acute intracranial abnormality.  Generalized atrophy 2. Cervical spine degenerative change.  Negative for fracture. Electronically Signed   By: Marlan Palau M.D.   On: 09/26/2019 15:42   CT Thoracic Spine Wo Contrast  Result Date: 09/26/2019 CLINICAL DATA:  Fall. EXAM: CT THORACIC SPINE WITHOUT CONTRAST TECHNIQUE: Multidetector CT images  of the thoracic were obtained using the standard protocol without intravenous contrast. COMPARISON:  None. FINDINGS: Alignment: Normal alignment. Mild gentle kyphosis of the thoracic spine. Vertebrae: Negative for fracture Paraspinal and other soft tissues: No paraspinous mass or edema. Atherosclerotic calcification in the aorta and coronary arteries. Mild right lower lobe atelectasis. Remaining visualized lungs are clear Disc levels: Disc degeneration and Schmorl's nodes T7-8, T8-9, T10-11, T12-L1. No focal disc protrusion or spinal stenosis. IMPRESSION: Negative for thoracic spine fracture. Coronary artery calcification Aortic Atherosclerosis (ICD10-I70.0). Electronically Signed   By: Marlan Palau M.D.   On: 09/26/2019 15:45   CT Lumbar Spine Wo Contrast  Result Date: 09/26/2019 CLINICAL DATA:  Fall x2 today.  Dementia. EXAM: CT LUMBAR SPINE WITHOUT CONTRAST TECHNIQUE: Multidetector CT imaging of the lumbar spine was performed without intravenous  contrast administration. Multiplanar CT image reconstructions were also generated. COMPARISON:  None. FINDINGS: Segmentation: Normal Alignment: Slight retrolisthesis L1-2. Vertebrae: Negative for fracture or mass Paraspinal and other soft tissues: Atherosclerotic aorta without aneurysm. No paraspinous mass or adenopathy. Disc levels: T12-L1: Disc degeneration and spurring without significant stenosis. L1-2: Disc degeneration and mild spurring without stenosis L2-3: Mild disc degeneration L3-4: Mild disc degeneration. Bilateral facet hypertrophy without significant stenosis L4-5: Moderate disc degeneration and moderate to severe facet degeneration. Moderate spinal stenosis and moderate subarticular stenosis bilaterally L5-S1: Bilateral facet degeneration with moderate subarticular stenosis bilaterally. IMPRESSION: 1. Negative for lumbar fracture 2. Lumbar degenerative changes as above. Electronically Signed   By: Marlan Palauharles  Clark M.D.   On: 09/26/2019 15:29   DG  Humerus Right  Result Date: 09/26/2019 CLINICAL DATA:  Pain status post fall EXAM: RIGHT HUMERUS - 2+ VIEW COMPARISON:  None. FINDINGS: There is no acute displaced fracture or dislocation. There is soft tissue swelling about the right upper extremity. The osseous mineralization is decreased. There is no unexpected radiopaque foreign body. IMPRESSION: 1. No acute displaced fracture or dislocation. 2. Soft tissue swelling about the right upper extremity. Electronically Signed   By: Katherine Mantlehristopher  Green M.D.   On: 09/26/2019 15:06   EEG adult  Result Date: 09/27/2019 Charlsie QuestYadav, Priyanka O, MD     09/27/2019 11:08 AM Patient Name: Misty BobBarbara Edelstein MRN: [id number] Epilepsy Attending: Charlsie QuestPriyanka O Yadav Referring Physician/Provider: Dr. Carolyne Littlesurtis Woods Date: 09/27/2019 Duration: 26.15 minutes Patient history: 84 year old female with syncope.  EEG to evaluate for seizures. Level of alertness: Awake, asleep AEDs during EEG study: None Technical aspects: This EEG study was done with scalp electrodes positioned according to the 10-20 International system of electrode placement. Electrical activity was acquired at a sampling rate of 500Hz  and reviewed with a high frequency filter of 70Hz  and a low frequency filter of 1Hz . EEG data were recorded continuously and digitally stored. Description: The posterior dominant rhythm consists of 9-10 Hz activity of moderate voltage (25-35 uV) seen predominantly in posterior head regions, symmetric and reactive to eye opening and eye closing.  Sleep was characterized by vertex waves, sleep spindles (12 to 14 Hz), maximal frontocentral region.  Physiologic photic driving was seen during photic stimulation.  Hyperventilation was not performed. IMPRESSION: This study is within normal limits. No seizures or epileptiform discharges were seen throughout the recording. Charlsie Questriyanka O Yadav   ECHOCARDIOGRAM COMPLETE  Result Date: 09/27/2019    ECHOCARDIOGRAM REPORT   Patient Name:   Misty Price Date of Exam:  09/27/2019 Medical Rec #:  [id number]       Height:       62.0 in Accession #:    [id number]      Weight:       169.3 lb Date of Birth:  [birth year]      BSA:          1.781 m Patient Age:    83 years        BP:           131/70 mmHg Patient Gender: F               HR:           71 bpm. [facility] Procedure: 2D Echo, Cardiac Doppler and Color Doppler Indications:    Syncope 780.2 / R55  History:        Patient has no prior history of Echocardiogram examinations.  Risk Factors:Hypertension and Diabetes. Dementia.  Sonographer:    Celesta Gentile RCS Referring Phys: [phone] Heloise Beecham EMOKPAE IMPRESSIONS  1. Left ventricular ejection fraction, by estimation, is >75%. The left ventricle has hyperdynamic function. The left ventricle has no regional wall motion abnormalities. There is mild left ventricular hypertrophy. Left ventricular diastolic parameters are consistent with Grade I diastolic dysfunction (impaired relaxation).  2. Right ventricular systolic function is normal. The right ventricular size is normal. Tricuspid regurgitation signal is inadequate for assessing PA pressure.  3. The mitral valve is grossly normal. Trivial mitral valve regurgitation.  4. The aortic valve is tricuspid. Aortic valve regurgitation is not visualized. Mild aortic valve sclerosis is present, with no evidence of aortic valve stenosis.  5. The inferior vena cava is normal in size with greater than 50% respiratory variability, suggesting right atrial pressure of 3 mmHg. FINDINGS  Left Ventricle: Left ventricular ejection fraction, by estimation, is >75%. The left ventricle has hyperdynamic function. The left ventricle has no regional wall motion abnormalities. The left ventricular internal cavity size was normal in size. There is mild left ventricular hypertrophy. Left ventricular diastolic parameters are consistent with Grade I diastolic dysfunction (impaired relaxation). Right Ventricle: The right ventricular  size is normal. No increase in right ventricular wall thickness. Right ventricular systolic function is normal. Tricuspid regurgitation signal is inadequate for assessing PA pressure. Left Atrium: Left atrial size was normal in size. Right Atrium: Right atrial size was normal in size. Pericardium: There is no evidence of pericardial effusion. Presence of pericardial fat pad. Mitral Valve: The mitral valve is grossly normal. Mild to moderate mitral annular calcification. Trivial mitral valve regurgitation. Tricuspid Valve: The tricuspid valve is grossly normal. Tricuspid valve regurgitation is trivial. Aortic Valve: The aortic valve is tricuspid. Aortic valve regurgitation is not visualized. Mild aortic valve sclerosis is present, with no evidence of aortic valve stenosis. Mild aortic valve annular calcification. Aortic valve mean gradient measures 7.0  mmHg. Aortic valve peak gradient measures 13.7 mmHg. Aortic valve area, by VTI measures 1.78 cm. Pulmonic Valve: The pulmonic valve was grossly normal. Pulmonic valve regurgitation is trivial. Aorta: The aortic root is normal in size and structure. Venous: The inferior vena cava is normal in size with greater than 50% respiratory variability, suggesting right atrial pressure of 3 mmHg. IAS/Shunts: No atrial level shunt detected by color flow Doppler.  LEFT VENTRICLE PLAX 2D LVIDd:         3.85 cm  Diastology LVIDs:         1.84 cm  LV e' lateral:   4.57 cm/s LV PW:         0.90 cm  LV E/e' lateral: 19.5 LV IVS:        1.17 cm  LV e' medial:    4.46 cm/s LVOT diam:     1.70 cm  LV E/e' medial:  20.0 LV SV:         69 LV SV Index:   39 LVOT Area:     2.27 cm  RIGHT VENTRICLE RV S prime:     17.80 cm/s TAPSE (M-mode): 2.1 cm LEFT ATRIUM             Index       RIGHT ATRIUM           Index LA diam:        4.00 cm 2.25 cm/m  RA Area:     10.60 cm LA Vol (A2C):   49.3 ml 27.68 ml/m  RA Volume:   20.80 ml  11.68 ml/m LA Vol (A4C):   52.9 ml 29.70 ml/m LA Biplane Vol:  51.3 ml 28.80 ml/m  AORTIC VALVE AV Area (Vmax):    1.77 cm AV Area (Vmean):   1.63 cm AV Area (VTI):     1.78 cm AV Vmax:           185.00 cm/s AV Vmean:          128.000 cm/s AV VTI:            0.387 m AV Peak Grad:      13.7 mmHg AV Mean Grad:      7.0 mmHg LVOT Vmax:         144.00 cm/s LVOT Vmean:        91.800 cm/s LVOT VTI:          0.304 m LVOT/AV VTI ratio: 0.79  AORTA Ao Root diam: 2.70 cm MITRAL VALVE MV Area (PHT): 2.19 cm     SHUNTS MV Decel Time: 347 msec     Systemic VTI:  0.30 m MV E velocity: 89.20 cm/s   Systemic Diam: 1.70 cm MV A velocity: 125.00 cm/s MV E/A ratio:  0.71 Nona Dell MD Electronically signed by Nona Dell MD Signature Date/Time: 09/27/2019/3:49:42 PM    Final    DG Hips Bilat W or Wo Pelvis 3-4 Views  Result Date: 09/26/2019 CLINICAL DATA:  Pain status post fall EXAM: DG HIP (WITH OR WITHOUT PELVIS) 3-4V BILAT COMPARISON:  None. FINDINGS: There are moderate degenerative changes of both hips. There is no definite acute displaced fracture or dislocation. The osseous mineralization is decreased. IMPRESSION: Negative. Electronically Signed   By: Katherine Mantle M.D.   On: 09/26/2019 15:07   Scheduled Meds:  aspirin EC  81 mg Oral Daily   donepezil  10 mg Oral Daily   insulin aspart  0-5 Units Subcutaneous QHS   insulin aspart  0-9 Units Subcutaneous TID WC   insulin glargine  8 Units Subcutaneous QHS   isosorbide mononitrate  60 mg Oral Daily   linagliptin  5 mg Oral Daily   lisinopril  20 mg Oral Daily   memantine  10 mg Oral BID   sertraline  100 mg Oral Daily   Continuous Infusions:   LOS: 0 days    Time spent: 18 mins  Monterius Rolf Laural Benes, MD [facility]   To contact the attending provider between 7A-7P or the covering provider during after hours 7P-7A, please log into the web site [email or web address] and access using universal [place] password for that web site. If you do not have the password, please call the hospital  operator.  09/27/2019, 4:03 PM

## 2019-09-27 NOTE — Plan of Care (Signed)
  Problem: Acute Rehab PT Goals(only PT should resolve) Goal: Pt Will Go Supine/Side To Sit Outcome: Progressing Flowsheets (Taken 09/27/2019 0934) Pt will go Supine/Side to Sit: with modified independence Goal: Patient Will Transfer Sit To/From Stand Outcome: Progressing Flowsheets (Taken 09/27/2019 0934) Patient will transfer sit to/from stand:  with modified independence  with supervision Goal: Pt Will Transfer Bed To Chair/Chair To Bed Outcome: Progressing Flowsheets (Taken 09/27/2019 0934) Pt will Transfer Bed to Chair/Chair to Bed:  with modified independence  with supervision Goal: Pt Will Ambulate Outcome: Progressing Flowsheets (Taken 09/27/2019 0934) Pt will Ambulate:  75 feet  with supervision  with rolling walker   9:35 AM, 09/27/19 Ocie Bob, MPT Physical Therapist with [facility] [phone] office [phone] mobile phone

## 2019-09-27 NOTE — Progress Notes (Signed)
EEG complete - results pending 

## 2019-09-27 NOTE — TOC Initial Note (Signed)
Transition of Care [facility] - Initial/Assessment Note    Patient Details  Name: Misty Price MRN: [id number] Date of Birth: [birth year]  Transition of [facility] CM/SW Contact:    Leitha Bleak, RN Phone Number: 09/27/2019, 4:28 PM  Clinical Narrative:   Patient admitted for AKI. PT is recommending HHPT.  Pending MRI for other needs. Due to insurance [facility] referral sent to [facility] with [facility] for HHPT, TOC to follow.                 Expected Discharge Plan: Home w Home Health Services Barriers to Discharge: Continued Medical Work up   Patient Goals and CMS Choice Patient states their goals for this hospitalization and ongoing recovery are:: to go home CMS Medicare.gov Compare Post Acute Care list provided to:: Patient Represenative (must comment)    Expected Discharge Plan and Services Expected Discharge Plan: Home w Home Health Services    Living arrangements for the past 2 months: Single Family Home                    HH Arranged: PT HH Agency: [facility] Date [facility] Agency Contacted: 09/27/19 Time HH Agency Contacted: [id number] Representative spoke with at [facility] Agency: Denyse Amass  Prior Living Arrangements/Services Living arrangements for the past 2 months: Single Family Home Lives with:: Spouse   Do you feel safe going back to the place where you live?: Yes      Need for Family Participation in Patient Care: Yes (Comment) Care giver support system in place?: Yes (comment)   Criminal Activity/Legal Involvement Pertinent to Current Situation/Hospitalization: No - Comment as needed  Activities of Daily Living Home Assistive Devices/Equipment: CBG Meter, Eyeglasses, Hearing aid ADL Screening (condition at time of admission) Patient's cognitive ability adequate to safely complete daily activities?: No Is the patient deaf or have difficulty hearing?: Yes Does the patient have difficulty seeing, even when wearing glasses/contacts?: No Does the patient have difficulty  concentrating, remembering, or making decisions?: Yes Patient able to express need for assistance with ADLs?: Yes Does the patient have difficulty dressing or bathing?: Yes Independently performs ADLs?: No Communication: Needs assistance Is this a change from baseline?: Pre-admission baseline Dressing (OT): Needs assistance Is this a change from baseline?: Pre-admission baseline Grooming: Needs assistance Is this a change from baseline?: Pre-admission baseline Feeding: Independent Bathing: Needs assistance Is this a change from baseline?: Pre-admission baseline Toileting: Independent In/Out Bed: Independent Walks in Home: Independent Does the patient have difficulty walking or climbing stairs?: Yes Weakness of Legs: None Weakness of Arms/Hands: None    Emotional Assessment      Alcohol / Substance Use: Not Applicable Psych Involvement: No (comment)  Admission diagnosis:  Syncope [R55] Fall [W19.XXXA] Fall, initial encounter [id number]] Syncope, unspecified syncope type [R55] Patient Active Problem List   Diagnosis Date Noted  . Diabetes mellitus type 2, controlled, with complications (HCC) 09/27/2019  . Benign essential HTN 09/27/2019  . Fall 09/26/2019  . Diabetes mellitus (HCC) 09/26/2019  . Dementia (HCC) 09/26/2019  . Syncope 09/26/2019   PCP:  Benita Stabile, MD Pharmacy:   [id number] - [place], [place] - [address] [address] Phone: [phone] Fax: [phone]  [facility] [phone] - [place], [place] - [address] AT [facility] [address] [place] [address] Phone: [phone] Fax: [phone]

## 2019-09-28 DIAGNOSIS — I10 Essential (primary) hypertension: Secondary | ICD-10-CM | POA: Diagnosis not present

## 2019-09-28 DIAGNOSIS — E118 Type 2 diabetes mellitus with unspecified complications: Secondary | ICD-10-CM | POA: Diagnosis not present

## 2019-09-28 DIAGNOSIS — W19XXXD Unspecified fall, subsequent encounter: Secondary | ICD-10-CM | POA: Diagnosis not present

## 2019-09-28 DIAGNOSIS — F0390 Unspecified dementia without behavioral disturbance: Secondary | ICD-10-CM | POA: Diagnosis not present

## 2019-09-28 LAB — VITAMIN B12: Vitamin B-12: 173 pg/mL — ABNORMAL LOW (ref 180–914)

## 2019-09-28 LAB — RPR: RPR Ser Ql: NONREACTIVE

## 2019-09-28 LAB — GLUCOSE, CAPILLARY
Glucose-Capillary: 81 mg/dL (ref 70–99)
Glucose-Capillary: 136 mg/dL — ABNORMAL HIGH (ref 70–99)

## 2019-09-28 LAB — URINE CULTURE: Culture: 10000 — AB

## 2019-09-28 LAB — PROLACTIN: Prolactin: 63 ng/mL — ABNORMAL HIGH (ref 4.8–23.3)

## 2019-09-28 MED ORDER — CYANOCOBALAMIN 1000 MCG/ML IJ SOLN
1000.0000 ug | Freq: Once | INTRAMUSCULAR | Status: AC
  Administered 2019-09-28: 12:00:00 1000 ug via INTRAMUSCULAR
  Filled 2019-09-28: qty 1

## 2019-09-28 MED ORDER — LANTUS SOLOSTAR 100 UNIT/ML ~~LOC~~ SOPN: 8.0000 [IU] | PEN_INJECTOR | Freq: Every day | SUBCUTANEOUS | 11 refills | Status: DC

## 2019-09-28 NOTE — Progress Notes (Signed)
Discharge instructions reviewed with patient and patient's daughter, Chip Boer, both verbalized understanding of instructions.  Patient discharged home with daughter in stable condition.

## 2019-09-28 NOTE — Discharge Summary (Signed)
Physician Discharge Summary  Misty Price WJX:[id number] DOB: 1935/08/28 DOA: 09/26/2019  PCP: Benita Stabile, MD Neurologist:  Gerilyn Pilgrim   Admit date: 09/26/2019 Discharge date: 09/28/2019  Admitted From:  Home  Disposition:  Home with Home Health  Recommendations for Outpatient Follow-up:  1. Follow up with PCP in 1 weeks 2. Continue B12 injections once weekly x 3, then monthly for B12 deficiency 3. Please follow up with neurologist in 1 month  4. Please follow up with cardiologist as scheduled  5. Please follow up on the following pending results:  Home Health:  PT    Discharge Condition: STABLE   CODE STATUS: FULL    Brief Hospitalization Summary: Please see all hospital notes, images, labs for full details of the hospitalization. ADMISSION HPI: Misty Price is a 84 y.o. female with medical history significant for dementia, diabetes mellitus, syncope.  History is obtained from daughterLynden Price who is present at bedside, as at the time of my evaluation patient was awake has dementia unable to give me history, she is also upset about being admitted to the hospital. Patient was brought to the ED by daughter with reports of a fall 2 times today.  At about 11 AM this morning, patient fell in the bathroom, between the toilet and the bathtub, she hit her head when she fell.  Within an hour she got off of her bed and was walked to the bathroom and then fell again.  Both falls were unwitnessed.  Patient was in the ED 08/18/19-reports of 3 syncopal episodes, all 3 were witnessed.  They all occurred in the mornings, when patient was sitting on a chair or toilet.  Patient has not had any subsequent episodes of falls or passing out since then.  No twitching, or jerking of extremities noticed.  As far as daughter knows, patient has no history of seizures, no complaints of headache, no vomiting no loose stools has chronic poor p.o. intake which is unchanged, no fevers or chills.  Daughter reports a history  of vertigo and is unsure if this is related.  Patient moved from [name] about 3 months ago.,  She has been able to establish care with a primary care provider here.  Daughter is not aware if patient's blood pressure chronically runs high.  Patient ran out of her gabapentin about a month ago, but when she came to the ED in March she was still compliant with it.  ED Course: Heart rate 50s, blood pressure elevated up to 218/74.  In the ED patient had an episode when they had gotten her up to use the bedside commode, she had did not get to the commode before she suddenly was staring into space, stopped talking, she did not exactly become completely limp.  The monitors had been removed so patient could use the commode. Patient was helped back to the bed, and vitals were immediately taken, blood pressure was more elevated now in the 200s, heart rate remained in the 50s.  Patient had other episodes when she would just stop talking and stare into space.    Brief Narrative:  84 y.o.femalewith medical history significant fordementia, diabetes mellitus, syncope. History is obtained from daughterRocco Price is present at bedside,as at the time of my evaluation patient was awake has dementia unable to give me history, she is also upset about being admitted to the hospital.  Patient moved from [name] about 3 months ago. She has been able to establish care with a primary care provider here. Daughter isnot  aware if patient's blood pressure chronically runs high. Patient ran out of her gabapentin about a month ago, but when she came to the ED in March she was still compliant with it.  Assessment & Plan:     Fall Active Problems:   Diabetes mellitus    Dementia    Syncope   Diabetes mellitus type 2, controlled, with complications     Benign essential HTN  1. Syncope and fall-stable telemetry while in hospital, suspect vasovagal syncope, also patient presented with bradycardia on metoprolol  which was discontinued and HR now improved to 70s.  Metoprolol was not restarted.   MRI with age related changes but no acute findings.  EEG normal.   PT evaluation recommending HHPT which has been arranged.  I have asked for inpatient neurology consultation. 2. Type 2 diabetes mellitus-we reduced her Lantus to 8 units daily and she had very good blood glucose control with Tradjenta. 3. Essential hypertension-patient presented with elevated blood pressures but now have normalized.  Metoprolol was discontinued due to bradycardia.  I think this may have been contributing to her syncopal episodes and falling. 4. Vitamin B12 deficiency -B12 injection given in the hospital before discharge, patient strongly advised to continue B12 injections with PCP and take oral B12.  I spoke with the daughter who will make arrangements. 5. Dementia-family reports that disease has been progressing.  She is able to work with her at home and patient has been independent of ADLs recently.  She has been resumed on her home donezepil Zoloft and Namenda.  I spoke with daughter and she feels comfortable taking patient home.    DVT prophylaxis: SCDs Code Status: Full Family Communication: I spoke with daughter by telephone update Disposition:   Discharge Diagnoses:  Principal Problem:   Fall Active Problems:   Diabetes mellitus (HCC)   Dementia (HCC)   Syncope   Diabetes mellitus type 2, controlled, with complications (HCC)   Benign essential HTN   Discharge Instructions: Discharge Instructions    Ambulatory referral to Cardiology   Complete by: As directed      Allergies as of 09/28/2019      Reactions   Keppra [levetiracetam]    Daughter states pt was "out of it" after taking it about 2 or 3 times      Medication List    STOP taking these medications   gabapentin 300 MG capsule Commonly known as: NEURONTIN   metoprolol tartrate 50 MG tablet Commonly known as: LOPRESSOR     TAKE these medications    aspirin EC 81 MG tablet Take 81 mg by mouth daily.   donepezil 10 MG tablet Commonly known as: ARICEPT Take 10 mg by mouth daily.   isosorbide mononitrate 60 MG 24 hr tablet Commonly known as: IMDUR Take 60 mg by mouth daily.   Lantus SoloStar 100 UNIT/ML Solostar Pen Generic drug: insulin glargine Inject 8 Units into the skin at bedtime. What changed: how much to take   linagliptin 5 MG Tabs tablet Commonly known as: TRADJENTA Take 5 mg by mouth daily.   lisinopril 20 MG tablet Commonly known as: ZESTRIL Take 20 mg by mouth daily.   memantine 10 MG tablet Commonly known as: NAMENDA Take 10 mg by mouth 2 (two) times daily.   sertraline 100 MG tablet Commonly known as: ZOLOFT Take 100 mg by mouth daily.      Follow-up Information    [facility] Follow up.   Why:   PT  Benita StabileHall, John Z, MD. Schedule an appointment as soon as possible for a visit in 1 week(s).   Specialty: Internal Medicine Contact information: [address] [place] [facility] [place] [id number] [phone]        Beryle Beamsoonquah, Kofi, MD. Schedule an appointment as soon as possible for a visit in 1 week(s).   Specialty: Neurology Why: Hospital Follow Up syncope Contact information: [address] [place] [address] [phone]          Allergies  Allergen Reactions  . Keppra [Levetiracetam]     Daughter states pt was "out of it" after taking it about 2 or 3 times   Allergies as of 09/28/2019      Reactions   Keppra [levetiracetam]    Daughter states pt was "out of it" after taking it about 2 or 3 times      Medication List    STOP taking these medications   gabapentin 300 MG capsule Commonly known as: NEURONTIN   metoprolol tartrate 50 MG tablet Commonly known as: LOPRESSOR     TAKE these medications   aspirin EC 81 MG tablet Take 81 mg by mouth daily.   donepezil 10 MG tablet Commonly known as: ARICEPT Take 10 mg by mouth daily.   isosorbide mononitrate 60 MG  24 hr tablet Commonly known as: IMDUR Take 60 mg by mouth daily.   Lantus SoloStar 100 UNIT/ML Solostar Pen Generic drug: insulin glargine Inject 8 Units into the skin at bedtime. What changed: how much to take   linagliptin 5 MG Tabs tablet Commonly known as: TRADJENTA Take 5 mg by mouth daily.   lisinopril 20 MG tablet Commonly known as: ZESTRIL Take 20 mg by mouth daily.   memantine 10 MG tablet Commonly known as: NAMENDA Take 10 mg by mouth 2 (two) times daily.   sertraline 100 MG tablet Commonly known as: ZOLOFT Take 100 mg by mouth daily.       Procedures/Studies: DG Chest 1 View  Result Date: 09/26/2019 CLINICAL DATA:  Pain status post fall EXAM: CHEST  1 VIEW COMPARISON:  None. FINDINGS: The heart size and mediastinal contours are within normal limits. Both lungs are clear. The visualized skeletal structures are unremarkable. IMPRESSION: No active disease. Electronically Signed   By: Katherine Mantlehristopher  Green M.D.   On: 09/26/2019 15:09   DG Shoulder Right  Result Date: 09/26/2019 CLINICAL DATA:  Pain status post fall EXAM: RIGHT SHOULDER - 2+ VIEW COMPARISON:  None. FINDINGS: There is no evidence of fracture or dislocation. There is no evidence of arthropathy or other focal bone abnormality. Soft tissues are unremarkable. IMPRESSION: Negative. Electronically Signed   By: Katherine Mantlehristopher  Green M.D.   On: 09/26/2019 15:06   CT HEAD WO CONTRAST  Result Date: 09/26/2019 CLINICAL DATA:  Fall x2 today. EXAM: CT HEAD WITHOUT CONTRAST CT CERVICAL SPINE WITHOUT CONTRAST TECHNIQUE: Multidetector CT imaging of the head and cervical spine was performed following the standard protocol without intravenous contrast. Multiplanar CT image reconstructions of the cervical spine were also generated. COMPARISON:  CT head 08/18/2019 FINDINGS: CT HEAD FINDINGS Brain: Generalized atrophy without hydrocephalus. Negative for acute infarct, hemorrhage, mass. Vascular: Negative for hyperdense vessel Skull:  Negative for skull fracture Sinuses/Orbits: Paranasal sinuses clear. Bilateral cataract extraction. Other: None CT CERVICAL SPINE FINDINGS Alignment: Mild anterolisthesis C3-4 and C4-5 and C5-6. Skull base and vertebrae: Negative for fracture Soft tissues and spinal canal: No soft tissue mass or edema. Atherosclerotic calcification carotid bifurcation bilaterally. Disc levels: Disc degeneration and  facet degeneration in the cervical spine at multiple levels. No significant spinal stenosis. Upper chest: Lung apices clear bilaterally. Other: None IMPRESSION: 1. No acute intracranial abnormality.  Generalized atrophy 2. Cervical spine degenerative change.  Negative for fracture. Electronically Signed   By: Marlan Palau M.D.   On: 09/26/2019 15:42   CT Cervical Spine Wo Contrast  Result Date: 09/26/2019 CLINICAL DATA:  Fall x2 today. EXAM: CT HEAD WITHOUT CONTRAST CT CERVICAL SPINE WITHOUT CONTRAST TECHNIQUE: Multidetector CT imaging of the head and cervical spine was performed following the standard protocol without intravenous contrast. Multiplanar CT image reconstructions of the cervical spine were also generated. COMPARISON:  CT head 08/18/2019 FINDINGS: CT HEAD FINDINGS Brain: Generalized atrophy without hydrocephalus. Negative for acute infarct, hemorrhage, mass. Vascular: Negative for hyperdense vessel Skull: Negative for skull fracture Sinuses/Orbits: Paranasal sinuses clear. Bilateral cataract extraction. Other: None CT CERVICAL SPINE FINDINGS Alignment: Mild anterolisthesis C3-4 and C4-5 and C5-6. Skull base and vertebrae: Negative for fracture Soft tissues and spinal canal: No soft tissue mass or edema. Atherosclerotic calcification carotid bifurcation bilaterally. Disc levels: Disc degeneration and facet degeneration in the cervical spine at multiple levels. No significant spinal stenosis. Upper chest: Lung apices clear bilaterally. Other: None IMPRESSION: 1. No acute intracranial abnormality.   Generalized atrophy 2. Cervical spine degenerative change.  Negative for fracture. Electronically Signed   By: Marlan Palau M.D.   On: 09/26/2019 15:42   CT Thoracic Spine Wo Contrast  Result Date: 09/26/2019 CLINICAL DATA:  Fall. EXAM: CT THORACIC SPINE WITHOUT CONTRAST TECHNIQUE: Multidetector CT images of the thoracic were obtained using the standard protocol without intravenous contrast. COMPARISON:  None. FINDINGS: Alignment: Normal alignment. Mild gentle kyphosis of the thoracic spine. Vertebrae: Negative for fracture Paraspinal and other soft tissues: No paraspinous mass or edema. Atherosclerotic calcification in the aorta and coronary arteries. Mild right lower lobe atelectasis. Remaining visualized lungs are clear Disc levels: Disc degeneration and Schmorl's nodes T7-8, T8-9, T10-11, T12-L1. No focal disc protrusion or spinal stenosis. IMPRESSION: Negative for thoracic spine fracture. Coronary artery calcification Aortic Atherosclerosis (ICD10-I70.0). Electronically Signed   By: Marlan Palau M.D.   On: 09/26/2019 15:45   CT Lumbar Spine Wo Contrast  Result Date: 09/26/2019 CLINICAL DATA:  Fall x2 today.  Dementia. EXAM: CT LUMBAR SPINE WITHOUT CONTRAST TECHNIQUE: Multidetector CT imaging of the lumbar spine was performed without intravenous contrast administration. Multiplanar CT image reconstructions were also generated. COMPARISON:  None. FINDINGS: Segmentation: Normal Alignment: Slight retrolisthesis L1-2. Vertebrae: Negative for fracture or mass Paraspinal and other soft tissues: Atherosclerotic aorta without aneurysm. No paraspinous mass or adenopathy. Disc levels: T12-L1: Disc degeneration and spurring without significant stenosis. L1-2: Disc degeneration and mild spurring without stenosis L2-3: Mild disc degeneration L3-4: Mild disc degeneration. Bilateral facet hypertrophy without significant stenosis L4-5: Moderate disc degeneration and moderate to severe facet degeneration. Moderate  spinal stenosis and moderate subarticular stenosis bilaterally L5-S1: Bilateral facet degeneration with moderate subarticular stenosis bilaterally. IMPRESSION: 1. Negative for lumbar fracture 2. Lumbar degenerative changes as above. Electronically Signed   By: Marlan Palau M.D.   On: 09/26/2019 15:29   MR BRAIN WO CONTRAST  Result Date: 09/28/2019 CLINICAL DATA:  Multiple syncopal episodes and possible seizures. EXAM: MRI HEAD WITHOUT CONTRAST TECHNIQUE: Multiplanar, multiecho pulse sequences of the brain and surrounding structures were obtained without intravenous contrast. COMPARISON:  None. FINDINGS: BRAIN: No acute infarct, acute hemorrhage or extra-axial collection. Normal white matter signal for age. Normal volume of brain parenchyma and CSF spaces. Midline  structures are normal. VASCULAR: Major flow voids are preserved. Susceptibility-sensitive sequences show no chronic microhemorrhage or superficial siderosis. SKULL AND UPPER CERVICAL SPINE: Normal calvarium and skull base. Visualized upper cervical spine and soft tissues are normal. SINUSES/ORBITS: No paranasal sinus fluid levels or advanced mucosal thickening. No mastoid or middle ear effusion. Normal orbits. IMPRESSION: Normal aging brain. Electronically Signed   By: Deatra Robinson M.D.   On: 09/28/2019 02:48   DG Humerus Right  Result Date: 09/26/2019 CLINICAL DATA:  Pain status post fall EXAM: RIGHT HUMERUS - 2+ VIEW COMPARISON:  None. FINDINGS: There is no acute displaced fracture or dislocation. There is soft tissue swelling about the right upper extremity. The osseous mineralization is decreased. There is no unexpected radiopaque foreign body. IMPRESSION: 1. No acute displaced fracture or dislocation. 2. Soft tissue swelling about the right upper extremity. Electronically Signed   By: Katherine Mantle M.D.   On: 09/26/2019 15:06   EEG adult  Result Date: 09/27/2019 Misty Quest, MD     09/27/2019 11:08 AM Patient Name: Misty Price  MRN: [id number] Epilepsy Attending: Charlsie Price Referring Physician/Provider: Dr. Carolyne Littles Date: 09/27/2019 Duration: 26.15 minutes Patient history: 84 year old female with syncope.  EEG to evaluate for seizures. Level of alertness: Awake, asleep AEDs during EEG study: None Technical aspects: This EEG study was done with scalp electrodes positioned according to the 10-20 International system of electrode placement. Electrical activity was acquired at a sampling rate of  and reviewed with a high frequency filter of  and a low frequency filter of . EEG data were recorded continuously and digitally stored. Description: The posterior dominant rhythm consists of 9-10 Hz activity of moderate voltage (25-35 uV) seen predominantly in posterior head regions, symmetric and reactive to eye opening and eye closing.  Sleep was characterized by vertex waves, sleep spindles (12 to 14 Hz), maximal frontocentral region.  Physiologic photic driving was seen during photic stimulation.  Hyperventilation was not performed. IMPRESSION: This study is within normal limits. No seizures or epileptiform discharges were seen throughout the recording. Misty Price   ECHOCARDIOGRAM COMPLETE  Result Date: 09/27/2019    ECHOCARDIOGRAM REPORT   Patient Name:   Misty Price Date of Exam: 09/27/2019 Medical Rec #:  [id number]       Height:       62.0 in Accession #:    [id number]      Weight:       169.3 lb Date of Birth:  1935/11/23      BSA:          1.781 m Patient Age:    83 years        BP:           131/70 mmHg Patient Gender: F               HR:           71 bpm. [facility] Procedure: 2D Echo, Cardiac Doppler and Color Doppler Indications:    Syncope 780.2 / R55  History:        Patient has no prior history of Echocardiogram examinations.                 Risk Factors:Hypertension and Diabetes. Dementia.  Sonographer:    Celesta Gentile RCS Referring Phys: [phone] Heloise Beecham EMOKPAE IMPRESSIONS  1. Left  ventricular ejection fraction, by estimation, is >75%. The left ventricle has hyperdynamic function. The left ventricle has no regional wall motion  abnormalities. There is mild left ventricular hypertrophy. Left ventricular diastolic parameters are consistent with Grade I diastolic dysfunction (impaired relaxation).  2. Right ventricular systolic function is normal. The right ventricular size is normal. Tricuspid regurgitation signal is inadequate for assessing PA pressure.  3. The mitral valve is grossly normal. Trivial mitral valve regurgitation.  4. The aortic valve is tricuspid. Aortic valve regurgitation is not visualized. Mild aortic valve sclerosis is present, with no evidence of aortic valve stenosis.  5. The inferior vena cava is normal in size with greater than 50% respiratory variability, suggesting right atrial pressure of 3 mmHg. FINDINGS  Left Ventricle: Left ventricular ejection fraction, by estimation, is >75%. The left ventricle has hyperdynamic function. The left ventricle has no regional wall motion abnormalities. The left ventricular internal cavity size was normal in size. There is mild left ventricular hypertrophy. Left ventricular diastolic parameters are consistent with Grade I diastolic dysfunction (impaired relaxation). Right Ventricle: The right ventricular size is normal. No increase in right ventricular wall thickness. Right ventricular systolic function is normal. Tricuspid regurgitation signal is inadequate for assessing PA pressure. Left Atrium: Left atrial size was normal in size. Right Atrium: Right atrial size was normal in size. Pericardium: There is no evidence of pericardial effusion. Presence of pericardial fat pad. Mitral Valve: The mitral valve is grossly normal. Mild to moderate mitral annular calcification. Trivial mitral valve regurgitation. Tricuspid Valve: The tricuspid valve is grossly normal. Tricuspid valve regurgitation is trivial. Aortic Valve: The aortic valve is  tricuspid. Aortic valve regurgitation is not visualized. Mild aortic valve sclerosis is present, with no evidence of aortic valve stenosis. Mild aortic valve annular calcification. Aortic valve mean gradient measures 7.0  mmHg. Aortic valve peak gradient measures 13.7 mmHg. Aortic valve area, by VTI measures 1.78 cm. Pulmonic Valve: The pulmonic valve was grossly normal. Pulmonic valve regurgitation is trivial. Aorta: The aortic root is normal in size and structure. Venous: The inferior vena cava is normal in size with greater than 50% respiratory variability, suggesting right atrial pressure of 3 mmHg. IAS/Shunts: No atrial level shunt detected by color flow Doppler.  LEFT VENTRICLE PLAX 2D LVIDd:         3.85 cm  Diastology LVIDs:         1.84 cm  LV e' lateral:   4.57 cm/s LV PW:         0.90 cm  LV E/e' lateral: 19.5 LV IVS:        1.17 cm  LV e' medial:    4.46 cm/s LVOT diam:     1.70 cm  LV E/e' medial:  20.0 LV SV:         69 LV SV Index:   39 LVOT Area:     2.27 cm  RIGHT VENTRICLE RV S prime:     17.80 cm/s TAPSE (M-mode): 2.1 cm LEFT ATRIUM             Index       RIGHT ATRIUM           Index LA diam:        4.00 cm 2.25 cm/m  RA Area:     10.60 cm LA Vol (A2C):   49.3 ml 27.68 ml/m RA Volume:   20.80 ml  11.68 ml/m LA Vol (A4C):   52.9 ml 29.70 ml/m LA Biplane Vol: 51.3 ml 28.80 ml/m  AORTIC VALVE AV Area (Vmax):    1.77 cm AV Area (Vmean):   1.63 cm AV  Area (VTI):     1.78 cm AV Vmax:           185.00 cm/s AV Vmean:          128.000 cm/s AV VTI:            0.387 m AV Peak Grad:      13.7 mmHg AV Mean Grad:      7.0 mmHg LVOT Vmax:         144.00 cm/s LVOT Vmean:        91.800 cm/s LVOT VTI:          0.304 m LVOT/AV VTI ratio: 0.79  AORTA Ao Root diam: 2.70 cm MITRAL VALVE MV Area (PHT): 2.19 cm     SHUNTS MV Decel Time: 347 msec     Systemic VTI:  0.30 m MV E velocity: 89.20 cm/s   Systemic Diam: 1.70 cm MV A velocity: 125.00 cm/s MV E/A ratio:  0.71 Nona Dell MD Electronically signed  by Nona Dell MD Signature Date/Time: 09/27/2019/3:49:42 PM    Final    DG Hips Bilat W or Wo Pelvis 3-4 Views  Result Date: 09/26/2019 CLINICAL DATA:  Pain status post fall EXAM: DG HIP (WITH OR WITHOUT PELVIS) 3-4V BILAT COMPARISON:  None. FINDINGS: There are moderate degenerative changes of both hips. There is no definite acute displaced fracture or dislocation. The osseous mineralization is decreased. IMPRESSION: Negative. Electronically Signed   By: Katherine Mantle M.D.   On: 09/26/2019 15:07      Subjective: Pt says she feels much better today, she is wanting to get home, no specific complaints.   Discharge Exam: Vitals:   09/27/19 2106 09/28/19 0528  BP: (!) 150/51 (!) 123/53  Pulse: 75 76  Resp: 18 20  Temp: 98.6 F (37 C) 97.7 F (36.5 C)  SpO2: 93% 90%   Vitals:   09/27/19 0739 09/27/19 2003 09/27/19 2106 09/28/19 0528  BP:   (!) 150/51 (!) 123/53  Pulse:   75 76  Resp:   18 20  Temp:   98.6 F (37 C) 97.7 F (36.5 C)  TempSrc:   Oral Oral  SpO2: 95% 96% 93% 90%  Weight:      Height:       General exam: Appears calm and comfortable  Respiratory system: Clear to auscultation. Respiratory effort normal. Cardiovascular system: S1 & S2 heard, RRR. No JVD, murmurs, rubs, gallops or clicks. No pedal edema. Gastrointestinal system: Abdomen is nondistended, soft and nontender. No organomegaly or masses felt. Normal bowel sounds heard. Central nervous system: Alert and oriented. No focal neurological deficits. Extremities: Symmetric 5 x 5 power. Skin: No rashes, lesions or ulcers Psychiatry: Judgement and insight appear normal. Mood & affect appropriate.    The results of significant diagnostics from this hospitalization (including imaging, microbiology, ancillary and laboratory) are listed below for reference.     Microbiology: Recent Results (from the past 240 hour(s))  Urine Culture     Status: Abnormal   Collection Time: 09/26/19  2:27 PM   Specimen:  Urine, Clean Catch  Result Value Ref Range Status   Specimen Description   Final    URINE, CLEAN CATCH Performed at [facility], [address], [place], [place] [address]    Special Requests   Final    NONE Performed at [facility], [address], [place], [place] [address]    Culture (A)  Final    10,000 COLONIES/mL MULTIPLE SPECIES PRESENT, SUGGEST RECOLLECTION   Report  Status 09/28/2019 FINAL  Final  SARS CORONAVIRUS 2 (TAT 6-24 HRS) Nasopharyngeal Nasopharyngeal Swab     Status: None   Collection Time: 09/26/19  4:46 PM   Specimen: Nasopharyngeal Swab  Result Value Ref Range Status   SARS Coronavirus 2 NEGATIVE NEGATIVE Final    Comment: (NOTE) SARS-CoV-2 target nucleic acids are NOT DETECTED. The SARS-CoV-2 RNA is generally detectable in upper and lower respiratory specimens during the acute phase of infection. Negative results do not preclude SARS-CoV-2 infection, do not rule out co-infections with other pathogens, and should not be used as the sole basis for treatment or other patient management decisions. Negative results must be combined with clinical observations, patient history, and epidemiological information. The expected result is Negative. Fact Sheet for Patients: HairSlick.no Fact Sheet for Healthcare Providers: quierodirigir.com This test is not yet approved or cleared by the [place] FDA and  has been authorized for detection and/or diagnosis of SARS-CoV-2 by FDA under an Emergency Use Authorization (EUA). This EUA will remain  in effect (meaning this test can be used) for the duration of the COVID-19 declaration under Section 56 4(b)(1) of the Act, 21 U.S.C. section 360bbb-3(b)(1), unless the authorization is terminated or revoked sooner. Performed at [facility], [address], [place], [place] [address]      Labs: BNP (last 3 results) No results for input(s): BNP in the last 8760  hours. Basic Metabolic Panel: Recent Labs  Lab 09/26/19 1412 09/26/19 1425 09/27/19 0809  NA  --  138 140  K  --  3.8 3.7  CL  --  103 106  CO2  --  26 25  GLUCOSE  --  137* 95  BUN  --  14 13  CREATININE  --  0.88 0.85  CALCIUM  --  8.9 8.5*  MG 1.9  --  1.8  PHOS 3.1  --  3.3   Liver Function Tests: Recent Labs  Lab 09/26/19 1425 09/27/19 0809  AST 21 22  ALT 14 12  ALKPHOS 56 45  BILITOT 0.7 0.8  PROT 6.8 5.7*  ALBUMIN 3.9 3.2*   No results for input(s): LIPASE, AMYLASE in the last 168 hours. No results for input(s): AMMONIA in the last 168 hours. CBC: Recent Labs  Lab 09/26/19 1425 09/27/19 0809  WBC 10.1 7.2  NEUTROABS 7.9* 4.5  HGB 13.5 11.9*  HCT 42.2 36.4  MCV 95.9 95.3  PLT 153 148*   Cardiac Enzymes: Recent Labs  Lab 09/26/19 1412  CKTOTAL 46   BNP: Invalid input(s): POCBNP CBG: Recent Labs  Lab 09/27/19 0121 09/27/19 0816 09/27/19 1115 09/27/19 2036 09/28/19 0756  GLUCAP 185* 81 107* 150* 81   D-Dimer No results for input(s): DDIMER in the last 72 hours. Hgb A1c No results for input(s): HGBA1C in the last 72 hours. Lipid Profile No results for input(s): CHOL, HDL, LDLCALC, TRIG, CHOLHDL, LDLDIRECT in the last 72 hours. Thyroid function studies Recent Labs    09/26/19 1813  TSH 3.187   Anemia work up Recent Labs    09/28/19 0558  VITAMINB12 173*   Urinalysis    Component Value Date/Time   COLORURINE STRAW (A) 09/26/2019 1427   APPEARANCEUR CLEAR 09/26/2019 1427   LABSPEC 1.011 09/26/2019 1427   PHURINE 7.0 09/26/2019 1427   GLUCOSEU NEGATIVE 09/26/2019 1427   HGBUR SMALL (A) 09/26/2019 1427   BILIRUBINUR NEGATIVE 09/26/2019 1427   KETONESUR 5 (A) 09/26/2019 1427   PROTEINUR NEGATIVE 09/26/2019 1427   NITRITE NEGATIVE  09/26/2019 [place] 09/26/2019 1427   Sepsis Labs Invalid input(s): PROCALCITONIN,  WBC,  LACTICIDVEN Microbiology Recent Results (from the past 240 hour(s))  Urine Culture      Status: Abnormal   Collection Time: 09/26/19  2:27 PM   Specimen: Urine, Clean Catch  Result Value Ref Range Status   Specimen Description   Final    URINE, CLEAN CATCH Performed at [facility], [address], [place], [place] [address]    Special Requests   Final    NONE Performed at [facility], [address] [place], [place] [address]    Culture (A)  Final    10,000 COLONIES/mL MULTIPLE SPECIES PRESENT, SUGGEST RECOLLECTION   Report Status 09/28/2019 FINAL  Final  SARS CORONAVIRUS 2 (TAT 6-24 HRS) Nasopharyngeal Nasopharyngeal Swab     Status: None   Collection Time: 09/26/19  4:46 PM   Specimen: Nasopharyngeal Swab  Result Value Ref Range Status   SARS Coronavirus 2 NEGATIVE NEGATIVE Final    Comment: (NOTE) SARS-CoV-2 target nucleic acids are NOT DETECTED. The SARS-CoV-2 RNA is generally detectable in upper and lower respiratory specimens during the acute phase of infection. Negative results do not preclude SARS-CoV-2 infection, do not rule out co-infections with other pathogens, and should not be used as the sole basis for treatment or other patient management decisions. Negative results must be combined with clinical observations, patient history, and epidemiological information. The expected result is Negative. Fact Sheet for Patients: SugarRoll.be Fact Sheet for Healthcare Providers: https://www.woods-mathews.com/ This test is not yet approved or cleared by the [place] FDA and  has been authorized for detection and/or diagnosis of SARS-CoV-2 by FDA under an Emergency Use Authorization (EUA). This EUA will remain  in effect (meaning this test can be used) for the duration of the COVID-19 declaration under Section 56 4(b)(1) of the Act, 21 U.S.C. section 360bbb-3(b)(1), unless the authorization is terminated or revoked sooner. Performed at [facility], [place] [address], [place], [place] [address]    Time  coordinating discharge:   SIGNED:  Irwin Brakeman, MD  [facility] 09/28/2019, 10:49 AM How to contact the [facility] Attending or Consulting provider [place] or covering provider during after hours [place], for this patient?  1. Check the care team in [facility] and look for a) attending/consulting TRH provider listed and b) the [facility] team listed 2. Log into [email or web address] and use [place]'s universal password to access. If you do not have the password, please contact the hospital operator. 3. Locate the [facility] provider you are looking for under [facility] and page to a number that you can be directly reached. 4. If you still have difficulty reaching the provider, please page the [facility] (Director on Call) for the Hospitalists listed on amion for assistance.

## 2019-09-28 NOTE — TOC Transition Note (Signed)
Transition of Care [facility]) - CM/SW Discharge Note   Patient Details  Name: Paizleigh Wilds MRN: [id number] Date of Birth: [birth year]  Transition of [facility]) CM/SW Contact:  Leitha Bleak, RN Phone Number: 09/28/2019, 11:10 AM   Clinical Narrative:   Patient discharging home, Denyse Amass with [name] updated. Added to AVS.  Barriers to Discharge: Continued Medical Work up   Patient Goals and CMS Choice Patient states their goals for this hospitalization and ongoing recovery are:: to go home CMS Medicare.gov Compare Post Acute Care list provided to:: Patient Represenative (must comment)    Discharge Placement             Discharge Plan and Services      HH Arranged: PT [facility] Agency: [facility] Date [facility] Agency Contacted: 09/27/19 Time HH Agency Contacted: [phone] Representative spoke with at [facility] Agency: Denyse Amass

## 2019-09-29 LAB — HOMOCYSTEINE: Homocysteine: 11.9 umol/L (ref 0.0–21.3)

## 2019-09-30 DIAGNOSIS — E538 Deficiency of other specified B group vitamins: Secondary | ICD-10-CM | POA: Diagnosis not present

## 2019-09-30 DIAGNOSIS — E119 Type 2 diabetes mellitus without complications: Secondary | ICD-10-CM | POA: Diagnosis not present

## 2019-09-30 DIAGNOSIS — I700 Atherosclerosis of aorta: Secondary | ICD-10-CM | POA: Diagnosis not present

## 2019-09-30 DIAGNOSIS — R55 Syncope and collapse: Secondary | ICD-10-CM | POA: Diagnosis not present

## 2019-09-30 DIAGNOSIS — F0280 Dementia in other diseases classified elsewhere without behavioral disturbance: Secondary | ICD-10-CM | POA: Diagnosis not present

## 2019-09-30 DIAGNOSIS — I2510 Atherosclerotic heart disease of native coronary artery without angina pectoris: Secondary | ICD-10-CM | POA: Diagnosis not present

## 2019-09-30 DIAGNOSIS — M47812 Spondylosis without myelopathy or radiculopathy, cervical region: Secondary | ICD-10-CM | POA: Diagnosis not present

## 2019-09-30 DIAGNOSIS — M47816 Spondylosis without myelopathy or radiculopathy, lumbar region: Secondary | ICD-10-CM | POA: Diagnosis not present

## 2019-09-30 DIAGNOSIS — I119 Hypertensive heart disease without heart failure: Secondary | ICD-10-CM | POA: Diagnosis not present

## 2019-09-30 DIAGNOSIS — I10 Essential (primary) hypertension: Secondary | ICD-10-CM | POA: Diagnosis not present

## 2019-10-05 DIAGNOSIS — M47816 Spondylosis without myelopathy or radiculopathy, lumbar region: Secondary | ICD-10-CM | POA: Diagnosis not present

## 2019-10-05 DIAGNOSIS — I2510 Atherosclerotic heart disease of native coronary artery without angina pectoris: Secondary | ICD-10-CM | POA: Diagnosis not present

## 2019-10-05 DIAGNOSIS — F0280 Dementia in other diseases classified elsewhere without behavioral disturbance: Secondary | ICD-10-CM | POA: Diagnosis not present

## 2019-10-05 DIAGNOSIS — E538 Deficiency of other specified B group vitamins: Secondary | ICD-10-CM | POA: Diagnosis not present

## 2019-10-05 DIAGNOSIS — R55 Syncope and collapse: Secondary | ICD-10-CM | POA: Diagnosis not present

## 2019-10-05 DIAGNOSIS — I700 Atherosclerosis of aorta: Secondary | ICD-10-CM | POA: Diagnosis not present

## 2019-10-05 DIAGNOSIS — M47812 Spondylosis without myelopathy or radiculopathy, cervical region: Secondary | ICD-10-CM | POA: Diagnosis not present

## 2019-10-05 DIAGNOSIS — E119 Type 2 diabetes mellitus without complications: Secondary | ICD-10-CM | POA: Diagnosis not present

## 2019-10-05 DIAGNOSIS — I119 Hypertensive heart disease without heart failure: Secondary | ICD-10-CM | POA: Diagnosis not present

## 2019-10-08 DIAGNOSIS — M47812 Spondylosis without myelopathy or radiculopathy, cervical region: Secondary | ICD-10-CM | POA: Diagnosis not present

## 2019-10-08 DIAGNOSIS — E119 Type 2 diabetes mellitus without complications: Secondary | ICD-10-CM | POA: Diagnosis not present

## 2019-10-08 DIAGNOSIS — F0280 Dementia in other diseases classified elsewhere without behavioral disturbance: Secondary | ICD-10-CM | POA: Diagnosis not present

## 2019-10-08 DIAGNOSIS — I700 Atherosclerosis of aorta: Secondary | ICD-10-CM | POA: Diagnosis not present

## 2019-10-08 DIAGNOSIS — E538 Deficiency of other specified B group vitamins: Secondary | ICD-10-CM | POA: Diagnosis not present

## 2019-10-08 DIAGNOSIS — R55 Syncope and collapse: Secondary | ICD-10-CM | POA: Diagnosis not present

## 2019-10-08 DIAGNOSIS — M47816 Spondylosis without myelopathy or radiculopathy, lumbar region: Secondary | ICD-10-CM | POA: Diagnosis not present

## 2019-10-08 DIAGNOSIS — I2510 Atherosclerotic heart disease of native coronary artery without angina pectoris: Secondary | ICD-10-CM | POA: Diagnosis not present

## 2019-10-08 DIAGNOSIS — I119 Hypertensive heart disease without heart failure: Secondary | ICD-10-CM | POA: Diagnosis not present

## 2019-10-11 DIAGNOSIS — I10 Essential (primary) hypertension: Secondary | ICD-10-CM | POA: Diagnosis not present

## 2019-10-11 DIAGNOSIS — R55 Syncope and collapse: Secondary | ICD-10-CM | POA: Diagnosis not present

## 2019-10-11 DIAGNOSIS — D519 Vitamin B12 deficiency anemia, unspecified: Secondary | ICD-10-CM | POA: Diagnosis not present

## 2019-10-11 DIAGNOSIS — F0280 Dementia in other diseases classified elsewhere without behavioral disturbance: Secondary | ICD-10-CM | POA: Diagnosis not present

## 2019-10-11 DIAGNOSIS — E1165 Type 2 diabetes mellitus with hyperglycemia: Secondary | ICD-10-CM | POA: Diagnosis not present

## 2019-10-12 DIAGNOSIS — I119 Hypertensive heart disease without heart failure: Secondary | ICD-10-CM | POA: Diagnosis not present

## 2019-10-12 DIAGNOSIS — I2510 Atherosclerotic heart disease of native coronary artery without angina pectoris: Secondary | ICD-10-CM | POA: Diagnosis not present

## 2019-10-12 DIAGNOSIS — R55 Syncope and collapse: Secondary | ICD-10-CM | POA: Diagnosis not present

## 2019-10-12 DIAGNOSIS — M47816 Spondylosis without myelopathy or radiculopathy, lumbar region: Secondary | ICD-10-CM | POA: Diagnosis not present

## 2019-10-12 DIAGNOSIS — F0280 Dementia in other diseases classified elsewhere without behavioral disturbance: Secondary | ICD-10-CM | POA: Diagnosis not present

## 2019-10-12 DIAGNOSIS — E538 Deficiency of other specified B group vitamins: Secondary | ICD-10-CM | POA: Diagnosis not present

## 2019-10-12 DIAGNOSIS — E119 Type 2 diabetes mellitus without complications: Secondary | ICD-10-CM | POA: Diagnosis not present

## 2019-10-12 DIAGNOSIS — M47812 Spondylosis without myelopathy or radiculopathy, cervical region: Secondary | ICD-10-CM | POA: Diagnosis not present

## 2019-10-12 DIAGNOSIS — I700 Atherosclerosis of aorta: Secondary | ICD-10-CM | POA: Diagnosis not present

## 2019-10-15 DIAGNOSIS — D519 Vitamin B12 deficiency anemia, unspecified: Secondary | ICD-10-CM | POA: Diagnosis not present

## 2019-10-18 DIAGNOSIS — I119 Hypertensive heart disease without heart failure: Secondary | ICD-10-CM | POA: Diagnosis not present

## 2019-10-18 DIAGNOSIS — F0280 Dementia in other diseases classified elsewhere without behavioral disturbance: Secondary | ICD-10-CM | POA: Diagnosis not present

## 2019-10-18 DIAGNOSIS — E119 Type 2 diabetes mellitus without complications: Secondary | ICD-10-CM | POA: Diagnosis not present

## 2019-10-18 DIAGNOSIS — E538 Deficiency of other specified B group vitamins: Secondary | ICD-10-CM | POA: Diagnosis not present

## 2019-10-18 DIAGNOSIS — I2510 Atherosclerotic heart disease of native coronary artery without angina pectoris: Secondary | ICD-10-CM | POA: Diagnosis not present

## 2019-10-18 DIAGNOSIS — I700 Atherosclerosis of aorta: Secondary | ICD-10-CM | POA: Diagnosis not present

## 2019-10-18 DIAGNOSIS — R55 Syncope and collapse: Secondary | ICD-10-CM | POA: Diagnosis not present

## 2019-10-18 DIAGNOSIS — M47812 Spondylosis without myelopathy or radiculopathy, cervical region: Secondary | ICD-10-CM | POA: Diagnosis not present

## 2019-10-18 DIAGNOSIS — M47816 Spondylosis without myelopathy or radiculopathy, lumbar region: Secondary | ICD-10-CM | POA: Diagnosis not present

## 2019-10-21 DIAGNOSIS — E119 Type 2 diabetes mellitus without complications: Secondary | ICD-10-CM | POA: Diagnosis not present

## 2019-10-21 DIAGNOSIS — E538 Deficiency of other specified B group vitamins: Secondary | ICD-10-CM | POA: Diagnosis not present

## 2019-10-21 DIAGNOSIS — I700 Atherosclerosis of aorta: Secondary | ICD-10-CM | POA: Diagnosis not present

## 2019-10-21 DIAGNOSIS — M47812 Spondylosis without myelopathy or radiculopathy, cervical region: Secondary | ICD-10-CM | POA: Diagnosis not present

## 2019-10-21 DIAGNOSIS — R55 Syncope and collapse: Secondary | ICD-10-CM | POA: Diagnosis not present

## 2019-10-21 DIAGNOSIS — I119 Hypertensive heart disease without heart failure: Secondary | ICD-10-CM | POA: Diagnosis not present

## 2019-10-21 DIAGNOSIS — M47816 Spondylosis without myelopathy or radiculopathy, lumbar region: Secondary | ICD-10-CM | POA: Diagnosis not present

## 2019-10-21 DIAGNOSIS — F0280 Dementia in other diseases classified elsewhere without behavioral disturbance: Secondary | ICD-10-CM | POA: Diagnosis not present

## 2019-10-21 DIAGNOSIS — I2510 Atherosclerotic heart disease of native coronary artery without angina pectoris: Secondary | ICD-10-CM | POA: Diagnosis not present

## 2019-10-22 DIAGNOSIS — D519 Vitamin B12 deficiency anemia, unspecified: Secondary | ICD-10-CM | POA: Diagnosis not present

## 2019-10-28 DIAGNOSIS — M47816 Spondylosis without myelopathy or radiculopathy, lumbar region: Secondary | ICD-10-CM | POA: Diagnosis not present

## 2019-10-28 DIAGNOSIS — F0280 Dementia in other diseases classified elsewhere without behavioral disturbance: Secondary | ICD-10-CM | POA: Diagnosis not present

## 2019-10-28 DIAGNOSIS — R55 Syncope and collapse: Secondary | ICD-10-CM | POA: Diagnosis not present

## 2019-10-28 DIAGNOSIS — I700 Atherosclerosis of aorta: Secondary | ICD-10-CM | POA: Diagnosis not present

## 2019-10-28 DIAGNOSIS — E538 Deficiency of other specified B group vitamins: Secondary | ICD-10-CM | POA: Diagnosis not present

## 2019-10-28 DIAGNOSIS — E119 Type 2 diabetes mellitus without complications: Secondary | ICD-10-CM | POA: Diagnosis not present

## 2019-10-28 DIAGNOSIS — I119 Hypertensive heart disease without heart failure: Secondary | ICD-10-CM | POA: Diagnosis not present

## 2019-10-28 DIAGNOSIS — M47812 Spondylosis without myelopathy or radiculopathy, cervical region: Secondary | ICD-10-CM | POA: Diagnosis not present

## 2019-10-28 DIAGNOSIS — I2510 Atherosclerotic heart disease of native coronary artery without angina pectoris: Secondary | ICD-10-CM | POA: Diagnosis not present

## 2019-10-29 DIAGNOSIS — D519 Vitamin B12 deficiency anemia, unspecified: Secondary | ICD-10-CM | POA: Diagnosis not present

## 2019-10-30 DIAGNOSIS — E119 Type 2 diabetes mellitus without complications: Secondary | ICD-10-CM | POA: Diagnosis not present

## 2019-10-30 DIAGNOSIS — E538 Deficiency of other specified B group vitamins: Secondary | ICD-10-CM | POA: Diagnosis not present

## 2019-10-30 DIAGNOSIS — M47816 Spondylosis without myelopathy or radiculopathy, lumbar region: Secondary | ICD-10-CM | POA: Diagnosis not present

## 2019-10-30 DIAGNOSIS — I119 Hypertensive heart disease without heart failure: Secondary | ICD-10-CM | POA: Diagnosis not present

## 2019-10-30 DIAGNOSIS — I2510 Atherosclerotic heart disease of native coronary artery without angina pectoris: Secondary | ICD-10-CM | POA: Diagnosis not present

## 2019-10-30 DIAGNOSIS — I700 Atherosclerosis of aorta: Secondary | ICD-10-CM | POA: Diagnosis not present

## 2019-10-30 DIAGNOSIS — M47812 Spondylosis without myelopathy or radiculopathy, cervical region: Secondary | ICD-10-CM | POA: Diagnosis not present

## 2019-10-30 DIAGNOSIS — F0280 Dementia in other diseases classified elsewhere without behavioral disturbance: Secondary | ICD-10-CM | POA: Diagnosis not present

## 2019-10-30 DIAGNOSIS — R55 Syncope and collapse: Secondary | ICD-10-CM | POA: Diagnosis not present

## 2019-11-04 DIAGNOSIS — E119 Type 2 diabetes mellitus without complications: Secondary | ICD-10-CM | POA: Diagnosis not present

## 2019-11-04 DIAGNOSIS — I700 Atherosclerosis of aorta: Secondary | ICD-10-CM | POA: Diagnosis not present

## 2019-11-04 DIAGNOSIS — R55 Syncope and collapse: Secondary | ICD-10-CM | POA: Diagnosis not present

## 2019-11-04 DIAGNOSIS — E538 Deficiency of other specified B group vitamins: Secondary | ICD-10-CM | POA: Diagnosis not present

## 2019-11-04 DIAGNOSIS — I119 Hypertensive heart disease without heart failure: Secondary | ICD-10-CM | POA: Diagnosis not present

## 2019-11-04 DIAGNOSIS — M47816 Spondylosis without myelopathy or radiculopathy, lumbar region: Secondary | ICD-10-CM | POA: Diagnosis not present

## 2019-11-04 DIAGNOSIS — F0280 Dementia in other diseases classified elsewhere without behavioral disturbance: Secondary | ICD-10-CM | POA: Diagnosis not present

## 2019-11-04 DIAGNOSIS — M47812 Spondylosis without myelopathy or radiculopathy, cervical region: Secondary | ICD-10-CM | POA: Diagnosis not present

## 2019-11-04 DIAGNOSIS — I2510 Atherosclerotic heart disease of native coronary artery without angina pectoris: Secondary | ICD-10-CM | POA: Diagnosis not present

## 2019-11-10 DIAGNOSIS — I119 Hypertensive heart disease without heart failure: Secondary | ICD-10-CM | POA: Diagnosis not present

## 2019-11-10 DIAGNOSIS — I700 Atherosclerosis of aorta: Secondary | ICD-10-CM | POA: Diagnosis not present

## 2019-11-10 DIAGNOSIS — E538 Deficiency of other specified B group vitamins: Secondary | ICD-10-CM | POA: Diagnosis not present

## 2019-11-10 DIAGNOSIS — R55 Syncope and collapse: Secondary | ICD-10-CM | POA: Diagnosis not present

## 2019-11-10 DIAGNOSIS — M47816 Spondylosis without myelopathy or radiculopathy, lumbar region: Secondary | ICD-10-CM | POA: Diagnosis not present

## 2019-11-10 DIAGNOSIS — M47812 Spondylosis without myelopathy or radiculopathy, cervical region: Secondary | ICD-10-CM | POA: Diagnosis not present

## 2019-11-10 DIAGNOSIS — E119 Type 2 diabetes mellitus without complications: Secondary | ICD-10-CM | POA: Diagnosis not present

## 2019-11-10 DIAGNOSIS — I2510 Atherosclerotic heart disease of native coronary artery without angina pectoris: Secondary | ICD-10-CM | POA: Diagnosis not present

## 2019-11-10 DIAGNOSIS — F0280 Dementia in other diseases classified elsewhere without behavioral disturbance: Secondary | ICD-10-CM | POA: Diagnosis not present

## 2019-11-15 ENCOUNTER — Ambulatory Visit: Payer: Medicare HMO | Admitting: Cardiology

## 2019-11-16 DIAGNOSIS — R269 Unspecified abnormalities of gait and mobility: Secondary | ICD-10-CM | POA: Diagnosis not present

## 2019-11-16 DIAGNOSIS — R55 Syncope and collapse: Secondary | ICD-10-CM | POA: Diagnosis not present

## 2019-11-16 DIAGNOSIS — G309 Alzheimer's disease, unspecified: Secondary | ICD-10-CM | POA: Diagnosis not present

## 2019-11-16 DIAGNOSIS — R278 Other lack of coordination: Secondary | ICD-10-CM | POA: Diagnosis not present

## 2019-11-23 DIAGNOSIS — D519 Vitamin B12 deficiency anemia, unspecified: Secondary | ICD-10-CM | POA: Diagnosis not present

## 2019-12-02 ENCOUNTER — Ambulatory Visit: Payer: Medicare HMO | Admitting: Cardiology

## 2019-12-03 ENCOUNTER — Ambulatory Visit (INDEPENDENT_AMBULATORY_CARE_PROVIDER_SITE_OTHER): Payer: Medicare HMO | Admitting: Cardiology

## 2019-12-03 ENCOUNTER — Encounter: Payer: Self-pay | Admitting: Cardiology

## 2019-12-03 ENCOUNTER — Other Ambulatory Visit: Payer: Self-pay

## 2019-12-03 VITALS — BP 102/66 | HR 86 | Ht 62.0 in | Wt 186.0 lb

## 2019-12-03 DIAGNOSIS — R55 Syncope and collapse: Secondary | ICD-10-CM

## 2019-12-03 NOTE — Progress Notes (Signed)
Clinical Summary Misty Price is a 84 y.o.female seen today as a new patient for the following medical problems  Moved from [name] in Feb. Family reports she saw a cardiologist there but do not know her history or who she saw  1. Syncope - admitted 09/2019 with syncope and fall - noted to have some bradycardia on beta blocker, metoprolol was discontinued - 09/2019 echo LVEF >75%, grade I DDx, normal RV - normal EEG.  - neurology thought vasovagal syncope   - 3-4 episodes since February.  - first episode at home. Was in the bathroom on the commode, felt dizzy. Stood her up and set her in to chair, passed out. Came around very quickly - next episode she recalls sitting in chair, started becoming groggy and leaned over. Out for short period time - family denies any positional - poor oral hydration     2. Dementia - history is primarily from the daughter     Past Medical History:  Diagnosis Date  . Dementia (HCC)   . Diabetes mellitus without complication (HCC)   . Syncopal episodes      Allergies  Allergen Reactions  . Keppra [Levetiracetam]     Daughter states pt was "out of it" after taking it about 2 or 3 times     Current Outpatient Medications  Medication Sig Dispense Refill  . aspirin EC 81 MG tablet Take 81 mg by mouth daily.    Marland Kitchen donepezil (ARICEPT) 10 MG tablet Take 10 mg by mouth daily.    . insulin glargine (LANTUS SOLOSTAR) 100 UNIT/ML Solostar Pen Inject 8 Units into the skin at bedtime. 15 mL 11  . isosorbide mononitrate (IMDUR) 60 MG 24 hr tablet Take 60 mg by mouth daily.    Marland Kitchen linagliptin (TRADJENTA) 5 MG TABS tablet Take 5 mg by mouth daily.    Marland Kitchen lisinopril (ZESTRIL) 20 MG tablet Take 20 mg by mouth daily.    . memantine (NAMENDA) 10 MG tablet Take 10 mg by mouth 2 (two) times daily.    . sertraline (ZOLOFT) 100 MG tablet Take 100 mg by mouth daily.     No current facility-administered medications for this visit.     Past Surgical History:   Procedure Laterality Date  . JOINT REPLACEMENT     bilateral knee replacement     Allergies  Allergen Reactions  . Keppra [Levetiracetam]     Daughter states pt was "out of it" after taking it about 2 or 3 times      No family history on file.   Social History Misty Price reports that she has quit smoking. She has never used smokeless tobacco. Misty Price has no history on file for alcohol use.   Review of Systems CONSTITUTIONAL: No weight loss, fever, chills, weakness or fatigue.  HEENT: Eyes: No visual loss, blurred vision, double vision or yellow sclerae.No hearing loss, sneezing, congestion, runny nose or sore throat.  SKIN: No rash or itching.  CARDIOVASCULAR: per hpi RESPIRATORY: No shortness of breath, cough or sputum.  GASTROINTESTINAL: No anorexia, nausea, vomiting or diarrhea. No abdominal pain or blood.  GENITOURINARY: No burning on urination, no polyuria NEUROLOGICAL: per hpi MUSCULOSKELETAL: No muscle, back pain, joint pain or stiffness.  LYMPHATICS: No enlarged nodes. No history of splenectomy.  PSYCHIATRIC: No history of depression or anxiety.  ENDOCRINOLOGIC: No reports of sweating, cold or heat intolerance. No polyuria or polydipsia.  Marland Kitchen   Physical Examination Today's Vitals   12/03/19 1325  BP: 102/66  Pulse: 86  SpO2: 95%  Weight: 186 lb (84.4 kg)  Height: 5\' 2"  (1.575 m)   Body mass index is 34.02 kg/m.  Gen: resting comfortably, no acute distress HEENT: no scleral icterus, pupils equal round and reactive, no palptable cervical adenopathy,  CV: RRR, no m/r/g, no jvd Resp: Clear to auscultation bilaterally GI: abdomen is soft, non-tender, non-distended, normal bowel sounds, no hepatosplenomegaly MSK: extremities are warm, no edema.  Skin: warm, no rash Neuro:  no focal deficits Psych: appropriate affect     Assessment and Plan  1. Syncope - unclear etiology, extensive benign workup during recent admission - evaluation is difficult  as patient has significant dementia and not able to give any history, family only able to give partial history as often only portions of the episodes are witnessed - she was not able to do orthostatics today as she actually became very symptomatic with standing and near syncope. Some of her prior episodes have occurred while sitting so not a clear orthostatic etiology but raises concern. Overall poor oral hydration. She is no long acting nitrates, family is not sure why. Has diabetes increasing risk of autonomic dysfunction - note 2% of patients with syncope on aricept  - we will d/c imdur, encouraged increased hydration - other consideration is symptomatic arrhythmia. With dementia patient not able to wear event monitor per family, if not other clear cause detected may consider a loop recorder         , M.D.

## 2019-12-03 NOTE — Patient Instructions (Signed)
Your physician recommends that you schedule a follow-up appointment in: 6 WEEKS WITH DR [facility]  Your physician has recommended you make the following change in your medication:   STOP IMDUR   Thank you for choosing [facility]!!

## 2019-12-22 DIAGNOSIS — D519 Vitamin B12 deficiency anemia, unspecified: Secondary | ICD-10-CM | POA: Diagnosis not present

## 2020-01-12 ENCOUNTER — Encounter: Payer: Self-pay | Admitting: Cardiology

## 2020-01-12 ENCOUNTER — Ambulatory Visit (INDEPENDENT_AMBULATORY_CARE_PROVIDER_SITE_OTHER): Payer: Medicare HMO | Admitting: Cardiology

## 2020-01-12 VITALS — BP 114/70 | HR 76 | Ht 62.0 in | Wt 155.6 lb

## 2020-01-12 DIAGNOSIS — R55 Syncope and collapse: Secondary | ICD-10-CM | POA: Diagnosis not present

## 2020-01-12 NOTE — Patient Instructions (Signed)

## 2020-01-12 NOTE — Progress Notes (Signed)
Clinical Summary Misty Price is a 84 y.o.female seen today for follow up of the following medical problems.   1. Syncope - admitted 09/2019 with syncope and fall - noted to have some bradycardia on beta blocker, metoprolol was discontinued - 09/2019 echo LVEF >75%, grade I DDx, normal RV - normal EEG.  - neurology thought vasovagal syncope   - 3-4 episodes since February.  - first episode at home. Was in the bathroom on the commode, felt dizzy. Stood her up and set her in to chair, passed out. Came around very quickly - next episode she recalls sitting in chair, started becoming groggy and leaned over. Out for short period time - family denies any positional - poor oral hydration  - no recurrent episodes since our last visit. Last visit we stopped her imdur and encouraged increased hydration.      2. Dementia - history is primarily from the daughter Past Medical History:  Diagnosis Date   Dementia (HCC)    Diabetes mellitus without complication (HCC)    Syncopal episodes      Allergies  Allergen Reactions   Keppra [Levetiracetam]     Daughter states pt was "out of it" after taking it about 2 or 3 times     Current Outpatient Medications  Medication Sig Dispense Refill   aspirin EC 81 MG tablet Take 81 mg by mouth daily.     donepezil (ARICEPT) 10 MG tablet Take 10 mg by mouth daily.     insulin glargine (LANTUS SOLOSTAR) 100 UNIT/ML Solostar Pen Inject 8 Units into the skin at bedtime. 15 mL 11   linagliptin (TRADJENTA) 5 MG TABS tablet Take 5 mg by mouth daily.     lisinopril (ZESTRIL) 20 MG tablet Take 20 mg by mouth daily.     sertraline (ZOLOFT) 100 MG tablet Take 100 mg by mouth daily.     No current facility-administered medications for this visit.     Past Surgical History:  Procedure Laterality Date   JOINT REPLACEMENT     bilateral knee replacement     Allergies  Allergen Reactions   Keppra [Levetiracetam]     Daughter  states pt was "out of it" after taking it about 2 or 3 times      No family history on file.   Social History Misty Price reports that she has quit smoking. She has never used smokeless tobacco. Misty Price has no history on file for alcohol use.   Review of Systems CONSTITUTIONAL: No weight loss, fever, chills, weakness or fatigue.  HEENT: Eyes: No visual loss, blurred vision, double vision or yellow sclerae.No hearing loss, sneezing, congestion, runny nose or sore throat.  SKIN: No rash or itching.  CARDIOVASCULAR: per hpi RESPIRATORY: No shortness of breath, cough or sputum.  GASTROINTESTINAL: No anorexia, nausea, vomiting or diarrhea. No abdominal pain or blood.  GENITOURINARY: No burning on urination, no polyuria NEUROLOGICAL: No headache, dizziness, syncope, paralysis, ataxia, numbness or tingling in the extremities. No change in bowel or bladder control.  MUSCULOSKELETAL: No muscle, back pain, joint pain or stiffness.  LYMPHATICS: No enlarged nodes. No history of splenectomy.  PSYCHIATRIC: No history of depression or anxiety.  ENDOCRINOLOGIC: No reports of sweating, cold or heat intolerance. No polyuria or polydipsia.  Marland Kitchen   Physical Examination Today's Vitals   01/12/20 1315  BP: 114/70  Pulse: 76  SpO2: 94%  Weight: 155 lb 9.6 oz (70.6 kg)  Height: 5\' 2"  (1.575 m)   Body  mass index is 28.46 kg/m.  Gen: resting comfortably, no acute distress HEENT: no scleral icterus, pupils equal round and reactive, no palptable cervical adenopathy,  CV: RRR, no m/r/g,no jvd Resp: Clear to auscultation bilaterally GI: abdomen is soft, non-tender, non-distended, normal bowel sounds, no hepatosplenomegaly MSK: extremities are warm, no edema.  Skin: warm, no rash Neuro:  no focal deficits Psych: appropriate affect     Assessment and Plan   1. Syncope - unclear etiology, extensive benign workup during recent admission - evaluation is difficult as patient has significant  dementia and not able to give any history, family only able to give partial history as often only portions of the episodes are witnessed - she was not able to do orthostatics previously as she actually became very symptomatic with standing and near syncope. Some of her prior episodes have occurred while sitting so not a clear orthostatic etiology but raises concern. Overall poor oral hydration, last visit we stopped her imdur  No recurrent symptoms. If recurrent would consider likely orthostatic syncope and retry orthostatics in clinic, potentially further wean her bp meds, consider trial of florinef vs midodrine.       Antoine Poche, M.D.

## 2020-01-13 DIAGNOSIS — Z0189 Encounter for other specified special examinations: Secondary | ICD-10-CM | POA: Diagnosis not present

## 2020-01-13 DIAGNOSIS — F0280 Dementia in other diseases classified elsewhere without behavioral disturbance: Secondary | ICD-10-CM | POA: Diagnosis not present

## 2020-01-13 DIAGNOSIS — M47816 Spondylosis without myelopathy or radiculopathy, lumbar region: Secondary | ICD-10-CM | POA: Diagnosis not present

## 2020-01-13 DIAGNOSIS — M47812 Spondylosis without myelopathy or radiculopathy, cervical region: Secondary | ICD-10-CM | POA: Diagnosis not present

## 2020-01-13 DIAGNOSIS — E119 Type 2 diabetes mellitus without complications: Secondary | ICD-10-CM | POA: Diagnosis not present

## 2020-01-13 DIAGNOSIS — D519 Vitamin B12 deficiency anemia, unspecified: Secondary | ICD-10-CM | POA: Diagnosis not present

## 2020-01-13 DIAGNOSIS — I119 Hypertensive heart disease without heart failure: Secondary | ICD-10-CM | POA: Diagnosis not present

## 2020-01-13 DIAGNOSIS — E538 Deficiency of other specified B group vitamins: Secondary | ICD-10-CM | POA: Diagnosis not present

## 2020-01-13 DIAGNOSIS — I2510 Atherosclerotic heart disease of native coronary artery without angina pectoris: Secondary | ICD-10-CM | POA: Diagnosis not present

## 2020-01-18 DIAGNOSIS — I25119 Atherosclerotic heart disease of native coronary artery with unspecified angina pectoris: Secondary | ICD-10-CM | POA: Diagnosis not present

## 2020-01-18 DIAGNOSIS — F330 Major depressive disorder, recurrent, mild: Secondary | ICD-10-CM | POA: Diagnosis not present

## 2020-01-18 DIAGNOSIS — I10 Essential (primary) hypertension: Secondary | ICD-10-CM | POA: Diagnosis not present

## 2020-01-18 DIAGNOSIS — D519 Vitamin B12 deficiency anemia, unspecified: Secondary | ICD-10-CM | POA: Diagnosis not present

## 2020-01-18 DIAGNOSIS — F0390 Unspecified dementia without behavioral disturbance: Secondary | ICD-10-CM | POA: Diagnosis not present

## 2020-01-18 DIAGNOSIS — E782 Mixed hyperlipidemia: Secondary | ICD-10-CM | POA: Diagnosis not present

## 2020-01-18 DIAGNOSIS — N1831 Chronic kidney disease, stage 3a: Secondary | ICD-10-CM | POA: Diagnosis not present

## 2020-01-18 DIAGNOSIS — E1165 Type 2 diabetes mellitus with hyperglycemia: Secondary | ICD-10-CM | POA: Diagnosis not present

## 2020-02-07 DIAGNOSIS — R55 Syncope and collapse: Secondary | ICD-10-CM | POA: Diagnosis not present

## 2020-02-10 NOTE — Progress Notes (Signed)
Cardiology Office Note  Date: 02/11/2020   ID: Misty Price, DOB 03-30-1936, MRN [id number]  PCP:  Benita Stabile, MD  Cardiologist:  Dina Rich, MD Electrophysiologist:  None   Chief Complaint: Syncope and collapse  History of Present Illness: Misty Price is a 84 y.o. female with a history of syncope and collapse, dementia.  Recent admission 10/14/2019 with syncope and fall.  Had some bradycardia on beta-blocker.  Metoprolol was discontinued.  10/14/2019 echo EF greater than 75%, grade 1 DDx: Normal RV, normal EEG, neurology thought syncope due to vasovagal episode.  Had 3 episodes since February 2021.  At previous visit Imdur was stopped and hydration was encouraged.   Last encounter with Dr. Wyline Mood 01/12/2020 she had experienced no recurrent episodes since last visit.  History of dementia primarily from daughter.  Etiology of syncope was unclear due to patient history of dementia and not able to give history.  She was unable to do orthostatics previously as she became very symptomatic with standing and experiencing near syncope.  Some prior episodes had occurred while sitting.  No recurrent symptoms.  If recurrent  consider likely orthostatic syncope and retry orthostatics in clinic.  Potentially wean her from blood pressure medications then consider trial of Florinef versus midodrine.   She is here for follow-up today.  Attempted orthostatic vital signs on patient.  There were no significant changes in blood pressure or heart rate during the checks.  However the patient did become unsteady on her feet and apparently fell presyncopal.  She does have significant dementia and does not understand what I am saying to her daughter.  Blood pressures have ranged from 138-148 systolic.  Her blood sugar was checked and is 306 this morning.  Her daughter states she is taking Lantus 12 units at bedtime per daughter statement, as well as Tradjenta 5 mg daily.  Currently on lisinopril 20 mg  p.o. daily.  She denies any nausea, vomiting.  Denies any palpitations or arrhythmias.  No CVA or TIA-like symptoms.  Past Medical History:  Diagnosis Date  . Dementia (HCC)   . Diabetes mellitus without complication (HCC)   . Syncopal episodes     Past Surgical History:  Procedure Laterality Date  . JOINT REPLACEMENT     bilateral knee replacement    Current Outpatient Medications  Medication Sig Dispense Refill  . aspirin EC 81 MG tablet Take 81 mg by mouth daily.    . cyanocobalamin (,VITAMIN B-12,) 1000 MCG/ML injection Inject 1 mL into the muscle every 30 (thirty) days.    Marland Kitchen donepezil (ARICEPT) 10 MG tablet Take 10 mg by mouth daily.    . insulin glargine (LANTUS SOLOSTAR) 100 UNIT/ML Solostar Pen Inject 8 Units into the skin at bedtime. 15 mL 11  . linagliptin (TRADJENTA) 5 MG TABS tablet Take 5 mg by mouth daily.    Marland Kitchen lisinopril (ZESTRIL) 20 MG tablet Take 20 mg by mouth daily.    . sertraline (ZOLOFT) 100 MG tablet Take 100 mg by mouth daily.     No current facility-administered medications for this visit.   Allergies:  Keppra [levetiracetam]   Social History: The patient  reports that she has quit smoking. She has never used smokeless tobacco.   Family History: The patient's family history includes Heart attack in her father; Hypertension in her mother.   ROS:  Please see the history of present illness. Otherwise, complete review of systems is positive for none.  All other systems are reviewed and  negative.   Physical Exam: VS:  BP 138/80   Pulse 76   Ht 5\' 2"  (1.575 m)   Wt 156 lb (70.8 kg)   SpO2 94%   BMI 28.53 kg/m , BMI Body mass index is 28.53 kg/m.  Wt Readings from Last 3 Encounters:  02/11/20 156 lb (70.8 kg)  01/12/20 155 lb 9.6 oz (70.6 kg)  12/03/19 186 lb (84.4 kg)    General: Patient appears comfortable at rest. Neck: Supple, no elevated JVP or carotid bruits, no thyromegaly. Lungs: Clear to auscultation, nonlabored breathing at  rest. Cardiac: Regular rate and rhythm, no S3 or significant systolic murmur, no pericardial rub. Extremities: No pitting edema, distal pulses 2+. Skin: Warm and dry. Musculoskeletal: No kyphosis. Neuropsychiatric: Alert and oriented x3, affect grossly appropriate.  ECG:  EKG Sep 26, 2019 showed sinus bradycardia with rate of 52.  Recent Labwork: 09/26/2019: TSH 3.187 09/27/2019: ALT 12; AST 22; BUN 13; Creatinine, Ser 0.85; Hemoglobin 11.9; Magnesium 1.8; Platelets 148; Potassium 3.7; Sodium 140  No results found for: CHOL, TRIG, HDL, CHOLHDL, VLDL, LDLCALC, LDLDIRECT  Other Studies Reviewed Today:  Echocardiogram 09/27/2019  1. Left ventricular ejection fraction, by estimation, is >75%. The left ventricle has hyperdynamic function. The left ventricle has no regional wall motion abnormalities. There is mild left ventricular hypertrophy. Left ventricular diastolic parameters are consistent with Grade I diastolic dysfunction (impaired relaxation). 2. Right ventricular systolic function is normal. The right ventricular size is normal. Tricuspid regurgitation signal is inadequate for assessing PA pressure. 3. The mitral valve is grossly normal. Trivial mitral valve regurgitation. 4. The aortic valve is tricuspid. Aortic valve regurgitation is not visualized. Mild aortic valve sclerosis is present, with no evidence of aortic valve stenosis. 5. The inferior vena cava is normal in size with greater than 50% respiratory variability, suggesting right atrial pressure of 3 mmHg.  Assessment and Plan:  1. Vasovagal syncope   2. Benign essential HTN   3. Dementia without behavioral disturbance, unspecified dementia type (HCC)   4. Type 2 diabetes mellitus without complication, with long-term current use of insulin (HCC)    1. Vasovagal syncope History of vasovagal syncopal episodes and recurrent seizures per history.  She is here for follow-up this morning.  Attempted orthostatic vital signs.   There were no significant changes in vital signs such as decreased blood pressures or increased heart rates.  When changing positions she felt very unsteady on her feet and had to be assisted to her chair from the exam table.  Blood sugar was 306 when we checked it.  It is possible she may have some type of bradycardia or tachyarrhythmia.  We will get a 14-day Zio patch monitor to assess for arrhythmias which may be causing the syncopal episodes.  It appears that last EKG showed baseline bradycardia of 52.  She may be having episodes of symptomatic bradycardia.  2. Benign essential HTN Initial blood pressure on arrival today is 138/80.  Orthostatic vital signs showed no significant changes in blood pressure or heart rates.  Systolic pressures ranged in the 138-148 range.  Heart rate stayed stable in the 70s.  She became unsteady from going from a lying to sitting to standing position.  She was able to ambulate with assistance back to the chair.  Continue lisinopril 20 mg daily.  3. Dementia without behavioral disturbance, unspecified dementia type (HCC) Appears to have advancing dementia.  She is on Aricept 10 mg daily.  She is able to respond to some questions,  however she is asking her daughter what I am saying when I am talking.  Dementia may be playing some role in this situation as well.  4. Type 2 diabetes mellitus without complication, with long-term current use of insulin (HCC) Patient is currently taking Lantus 12 units per evening per daughter statement as well as Tradjenta 5 mg p.o. daily.  Blood sugar today after check today after patient became unsteady on her feet was 306.  Advised the patient's daughter she may need to have the PCP refer patient to endocrinology for possible better control of her diabetes.  Medication Adjustments/Labs and Tests Ordered: Current medicines are reviewed at length with the patient today.  Concerns regarding medicines are outlined above.   Disposition:  Follow-up with Dr. Wyline Mood or APP 1 month. Signed, Rennis Harding, NP 02/11/2020 8:15 AM    [facility] [address] [place], [place], [place] [address] Phone: [phone]; Fax: [phone]

## 2020-02-11 ENCOUNTER — Encounter: Payer: Self-pay | Admitting: Family Medicine

## 2020-02-11 ENCOUNTER — Other Ambulatory Visit: Payer: Self-pay

## 2020-02-11 ENCOUNTER — Telehealth: Payer: Self-pay | Admitting: Family Medicine

## 2020-02-11 ENCOUNTER — Ambulatory Visit (INDEPENDENT_AMBULATORY_CARE_PROVIDER_SITE_OTHER): Payer: Medicare HMO | Admitting: Family Medicine

## 2020-02-11 VITALS — BP 138/80 | HR 76 | Ht 62.0 in | Wt 156.0 lb

## 2020-02-11 DIAGNOSIS — R55 Syncope and collapse: Secondary | ICD-10-CM

## 2020-02-11 DIAGNOSIS — I10 Essential (primary) hypertension: Secondary | ICD-10-CM

## 2020-02-11 DIAGNOSIS — F0390 Unspecified dementia without behavioral disturbance: Secondary | ICD-10-CM

## 2020-02-11 DIAGNOSIS — E119 Type 2 diabetes mellitus without complications: Secondary | ICD-10-CM

## 2020-02-11 DIAGNOSIS — Z794 Long term (current) use of insulin: Secondary | ICD-10-CM

## 2020-02-11 LAB — GLUCOSE, POCT (MANUAL RESULT ENTRY): POC Glucose: 306 mg/dl — AB (ref 70–99)

## 2020-02-11 NOTE — Patient Instructions (Signed)
Medication Instructions:  Continue all current medications.  Labwork: none  Testing/Procedures:  Your physician has recommended that you wear a 14 day event monitor. Event monitors are medical devices that record the heart's electrical activity. Doctors most often us these monitors to diagnose arrhythmias. Arrhythmias are problems with the speed or rhythm of the heartbeat. The monitor is a small, portable device. You can wear one while you do your normal daily activities. This is usually used to diagnose what is causing palpitations/syncope (passing out).  Office will contact with results via phone or letter.    Follow-Up: 1 month   Any Other Special Instructions Will Be Listed Below (If Applicable).  If you need a refill on your cardiac medications before your next appointment, please call your pharmacy.  

## 2020-02-11 NOTE — Telephone Encounter (Signed)
PERCERT:  14 day zio m14 day zio monitor - syncope

## 2020-02-17 DIAGNOSIS — R55 Syncope and collapse: Secondary | ICD-10-CM | POA: Diagnosis not present

## 2020-02-20 DIAGNOSIS — R55 Syncope and collapse: Secondary | ICD-10-CM | POA: Diagnosis not present

## 2020-02-21 ENCOUNTER — Telehealth: Payer: Self-pay | Admitting: *Deleted

## 2020-02-21 NOTE — Telephone Encounter (Signed)
Daughter informed and verbalized understanding.  Early termination of ZIO monitor

## 2020-02-21 NOTE — Telephone Encounter (Signed)
Per daughter, ZIO monitor was placed on Saturday and patient pulled monitor off after wearing if for 24 hours. Daughter reports that patient has dementia and gets confused. Daughter asking if a new monitor should be requested since patient will more than likely pull the patch off again.

## 2020-02-21 NOTE — Telephone Encounter (Signed)
Yeah I do not think we are going to get anywhere with the monitor.  Just call the daughter and let her know.  We do not have to get a new one.  The likelihood is this will happen again.  Thanks

## 2020-02-22 ENCOUNTER — Encounter: Payer: Self-pay | Admitting: Neurology

## 2020-02-29 ENCOUNTER — Other Ambulatory Visit: Payer: Self-pay | Admitting: *Deleted

## 2020-02-29 ENCOUNTER — Encounter (INDEPENDENT_AMBULATORY_CARE_PROVIDER_SITE_OTHER): Payer: Medicare HMO

## 2020-02-29 DIAGNOSIS — R55 Syncope and collapse: Secondary | ICD-10-CM | POA: Diagnosis not present

## 2020-03-01 DIAGNOSIS — G40219 Localization-related (focal) (partial) symptomatic epilepsy and epileptic syndromes with complex partial seizures, intractable, without status epilepticus: Secondary | ICD-10-CM | POA: Diagnosis not present

## 2020-03-03 ENCOUNTER — Ambulatory Visit: Payer: Medicare HMO | Admitting: Family Medicine

## 2020-03-13 ENCOUNTER — Ambulatory Visit: Payer: Medicare HMO | Admitting: Family Medicine

## 2020-04-04 DIAGNOSIS — R278 Other lack of coordination: Secondary | ICD-10-CM | POA: Diagnosis not present

## 2020-04-04 DIAGNOSIS — R55 Syncope and collapse: Secondary | ICD-10-CM | POA: Diagnosis not present

## 2020-04-04 DIAGNOSIS — G309 Alzheimer's disease, unspecified: Secondary | ICD-10-CM | POA: Diagnosis not present

## 2020-04-04 DIAGNOSIS — R269 Unspecified abnormalities of gait and mobility: Secondary | ICD-10-CM | POA: Diagnosis not present

## 2020-05-01 ENCOUNTER — Telehealth: Payer: Self-pay | Admitting: *Deleted

## 2020-05-01 NOTE — Telephone Encounter (Signed)
Spoke with pt daughter [name] (no DPR on file) and made her aware that we were unable to give results until this form is signed by pt, will send results via MyChart

## 2020-05-01 NOTE — Telephone Encounter (Signed)
-----   Message from Albertine Patricia, CMA sent at 05/01/2020 11:05 AM EST -----  ----- Message ----- From: Netta Neat., NP Sent: 04/26/2020   6:17 PM EST To: Lesle Chris, LPN  Please let her know the  24 hour monitor did not show any significant arrhythmias. Thank You

## 2020-05-25 ENCOUNTER — Encounter: Payer: Self-pay | Admitting: Neurology

## 2020-05-25 ENCOUNTER — Other Ambulatory Visit: Payer: Self-pay

## 2020-05-25 ENCOUNTER — Ambulatory Visit: Payer: Medicare HMO | Admitting: Neurology

## 2020-05-25 VITALS — BP 124/71 | HR 78 | Ht 62.0 in | Wt 156.2 lb

## 2020-05-25 DIAGNOSIS — R404 Transient alteration of awareness: Secondary | ICD-10-CM | POA: Diagnosis not present

## 2020-05-25 DIAGNOSIS — F0280 Dementia in other diseases classified elsewhere without behavioral disturbance: Secondary | ICD-10-CM

## 2020-05-25 DIAGNOSIS — G301 Alzheimer's disease with late onset: Secondary | ICD-10-CM

## 2020-05-25 NOTE — Patient Instructions (Signed)
1. Schedule 24-hour EEG  2. Discontinue Donepezil (Aricept)  3. Continue Memantine (namenda) and Levetiracetam (Keppra)  4. Follow-up in 2-3 months, call for any changes
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
## 2020-05-31 ENCOUNTER — Telehealth: Payer: Self-pay | Admitting: *Deleted

## 2020-05-31 NOTE — Telephone Encounter (Signed)
LMOM that I called to schedule her 24 hour EEG. I said as of now the dates available are 06/14/2020 at 9:30 am and 06/21/2020 at 7:30am or 9:30 am. I asked her to give [place] a call back and is she knows which date works for her she can leave a message and I will put it on the schedule or call her back is said date is already taken.i mentioned that we should try to get this done before her follow up appointment with Dr. Karel Jarvis on 07/10/20 and with enough time for her to read it.

## 2020-06-14 ENCOUNTER — Ambulatory Visit (INDEPENDENT_AMBULATORY_CARE_PROVIDER_SITE_OTHER): Payer: Medicare HMO | Admitting: Neurology

## 2020-06-14 ENCOUNTER — Other Ambulatory Visit: Payer: Self-pay

## 2020-06-14 DIAGNOSIS — G301 Alzheimer's disease with late onset: Secondary | ICD-10-CM

## 2020-06-14 DIAGNOSIS — F0280 Dementia in other diseases classified elsewhere without behavioral disturbance: Secondary | ICD-10-CM

## 2020-06-14 DIAGNOSIS — R404 Transient alteration of awareness: Secondary | ICD-10-CM | POA: Diagnosis not present

## 2020-06-24 IMAGING — MR MR HEAD W/O CM
9 of 10 series · 38 of 48 positions shown · non-contrast
Comparison: None.

CLINICAL DATA: Multiple syncopal episodes and possible seizures.

EXAM:
MRI HEAD WITHOUT CONTRAST
TECHNIQUE: Multiplanar, multiecho pulse sequences of the brain and surrounding
structures were obtained without intravenous contrast.

[Series 3: DWI · axial · 3.0mm · 1.09mm/px · z∈[-30,+117]mm · 11 of 104 slices shown (1 of 4)]
[im 1/104]
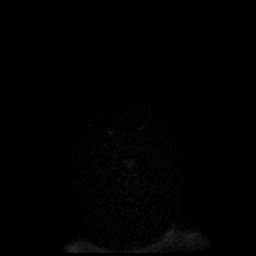
[im 11/104]
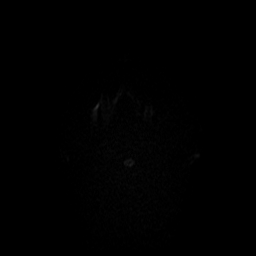
[im 21/104]
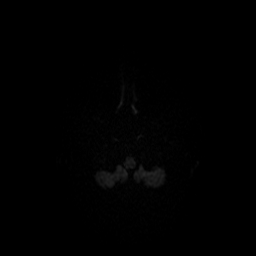
[im 31/104]
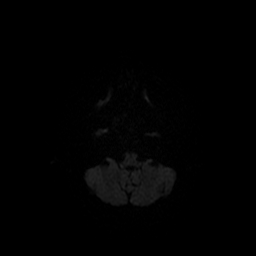
[im 42/104]
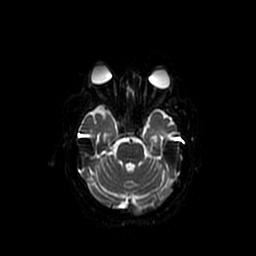
[im 52/104]
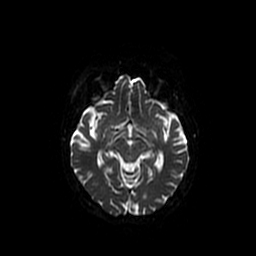
[im 62/104]
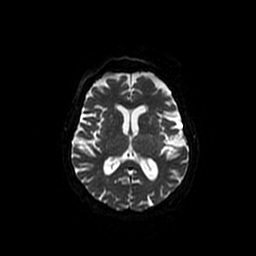
[im 73/104]
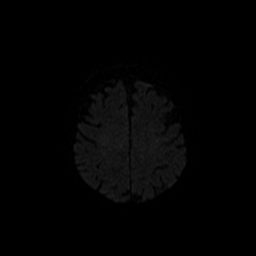
[im 83/104]
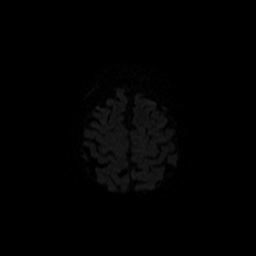
[im 93/104]
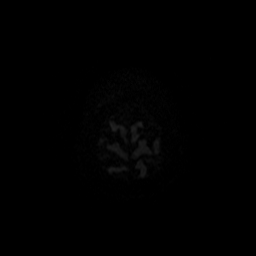
[im 104/104]
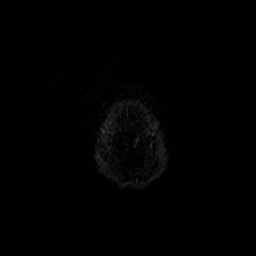

[Series 4: DWI · coronal · 5.0mm · 1.09mm/px · 8 of 72 slices shown (2 of 4)]
[im 1/72]
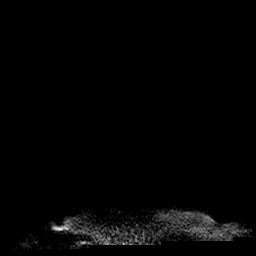
[im 11/72]
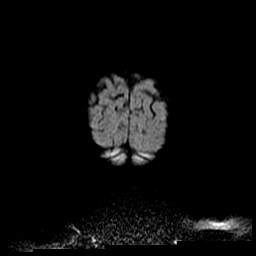
[im 21/72]
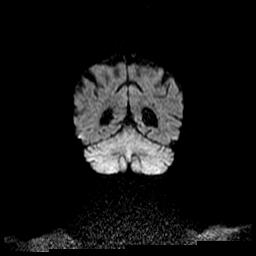
[im 31/72]
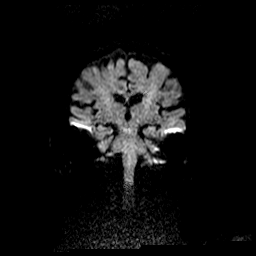
[im 41/72]
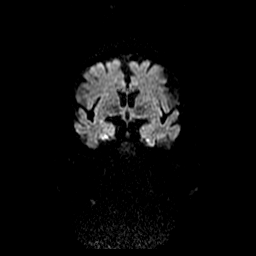
[im 51/72]
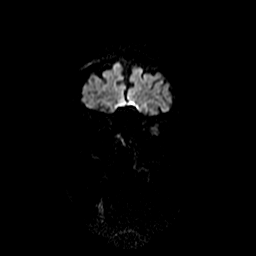
[im 61/72]
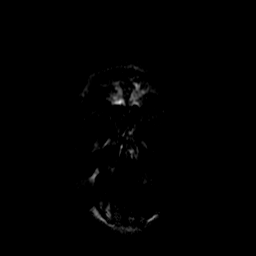
[im 72/72]
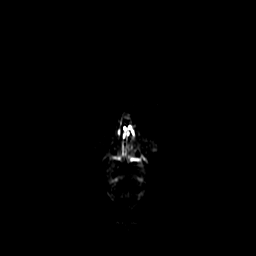

[Series 5: T1 · sagittal · 5.0mm · 0.47mm/px · 2 of 25 slices shown (1 of 2)]
[im 1/25]
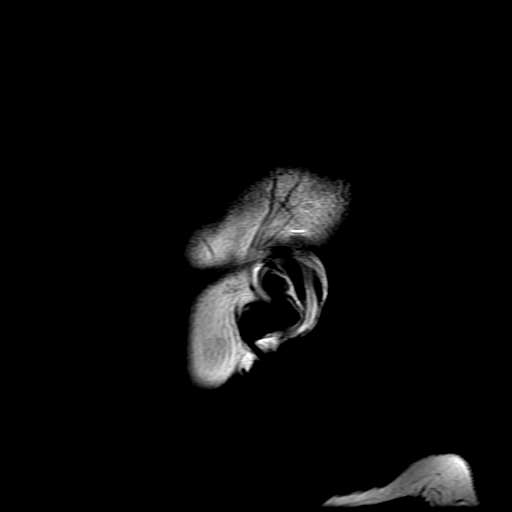
[im 25/25]
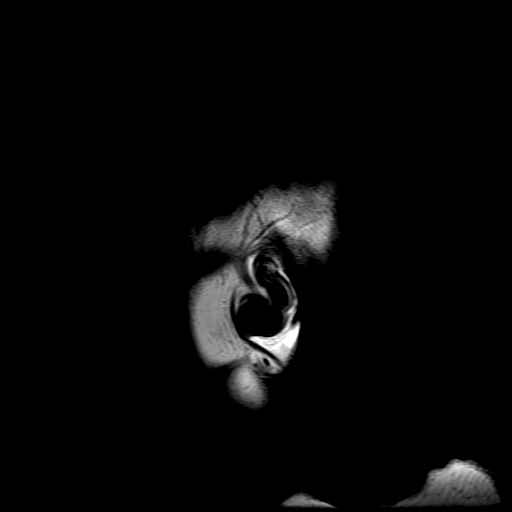

[Series 6: T2 · axial · 5.0mm · 0.43mm/px · z∈[-41,+98]mm · 2 of 25 slices shown (1 of 2)]
[im 1/25]
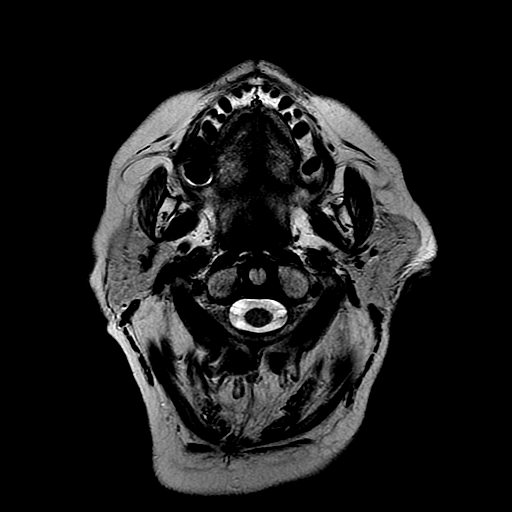
[im 25/25]
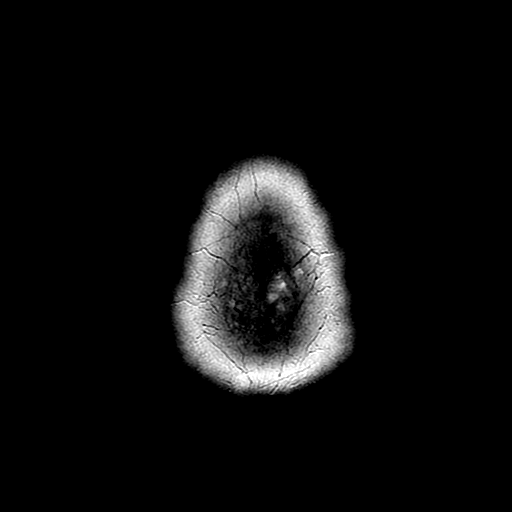

[Series 7: FLAIR · axial · 3.0mm · 0.43mm/px · z∈[-39,+101]mm · 2 of 25 slices shown]
[im 1/25]
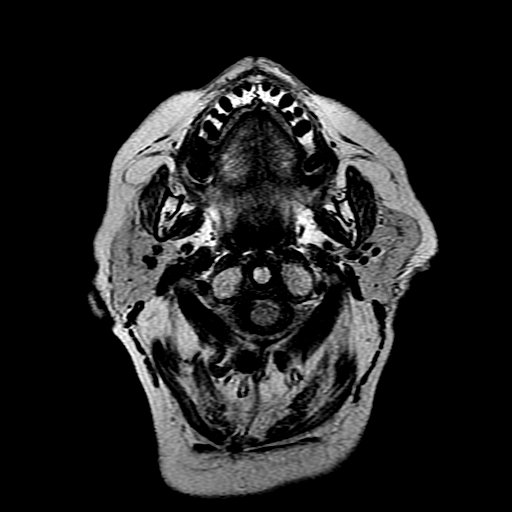
[im 25/25]
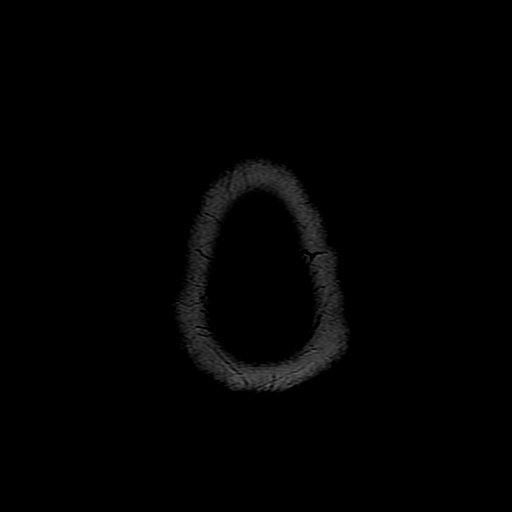

[Series 9: T1 · axial · 3.0mm · 0.47mm/px · z∈[-43,-27]mm · 2 of 108 slices shown (2 of 2)]
[im 1/108]
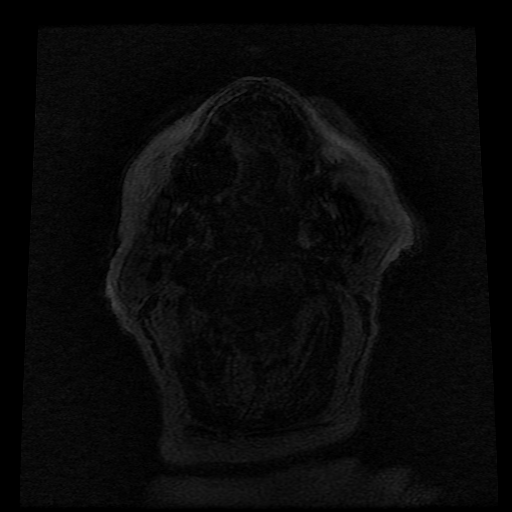
[im 12/108]
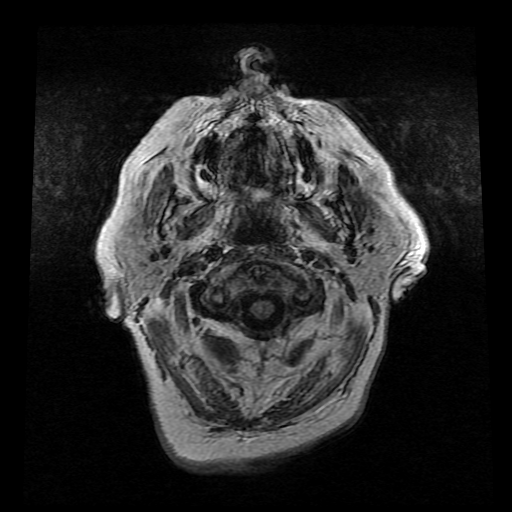

[Series 10: T2 · coronal · 5.0mm · 0.39mm/px · 3 of 28 slices shown (2 of 2)]
[im 1/28]
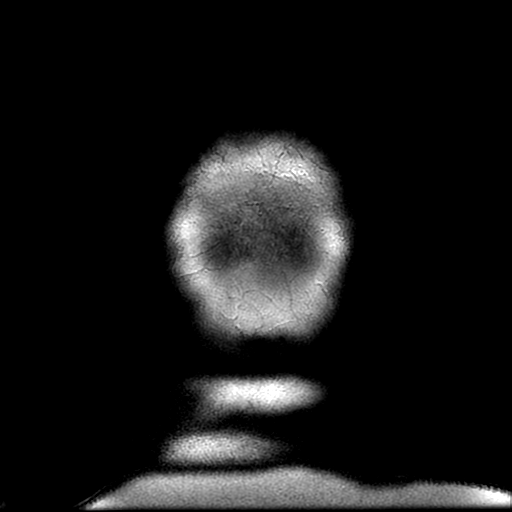
[im 14/28]
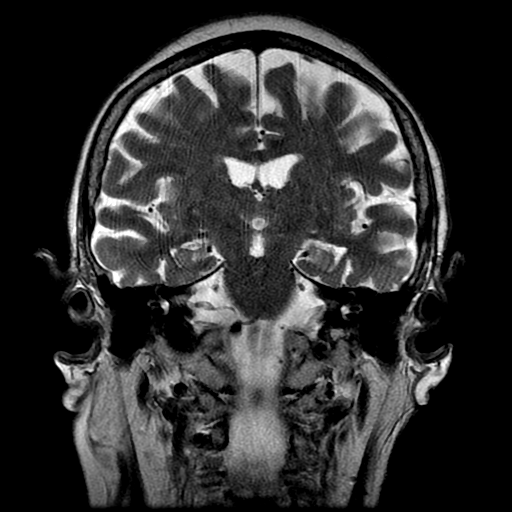
[im 28/28]
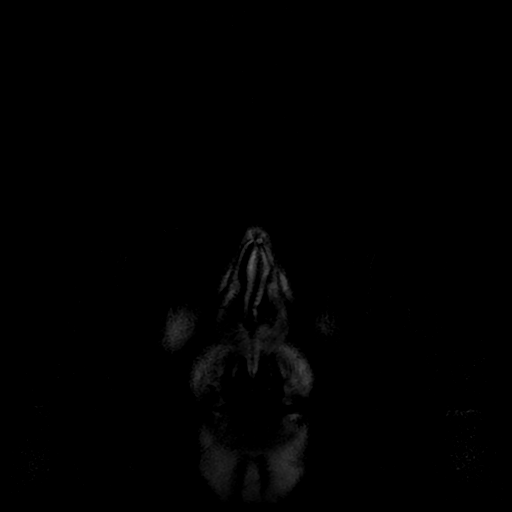

[Series 300: DWI · axial · 3.0mm · 1.09mm/px · z∈[-30,+117]mm · 5 of 52 slices shown (3 of 4)]
[im 1/52]
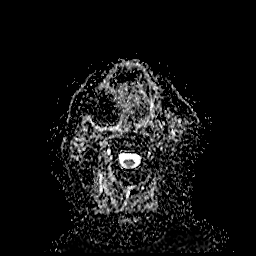
[im 13/52]
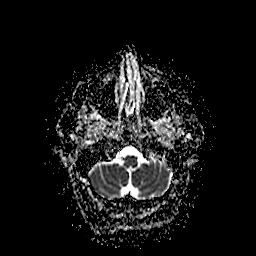
[im 26/52]
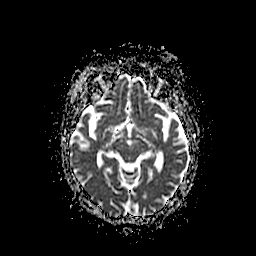
[im 39/52]
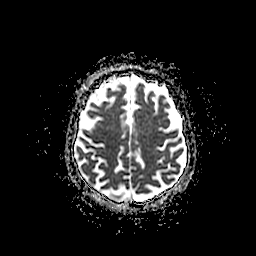
[im 52/52]
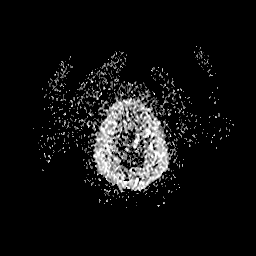

[Series 400: DWI · coronal · 5.0mm · 1.09mm/px · 3 of 36 slices shown (4 of 4)]
[im 1/36]
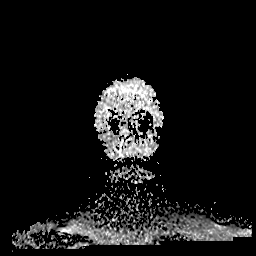
[im 18/36]
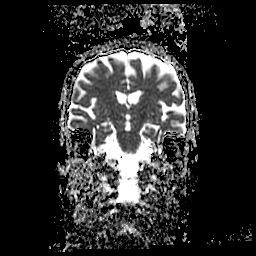
[im 36/36]
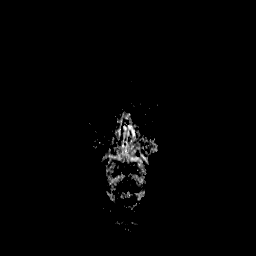

[38 of 48 positions shown; findings below may reference images not displayed]

FINDINGS: BRAIN: No acute infarct, acute hemorrhage or extra-axial collection.
Normal white matter signal for age. Normal volume of brain
parenchyma and CSF spaces. Midline structures are normal.

VASCULAR: Major flow voids are preserved. Susceptibility-sensitive
sequences show no chronic microhemorrhage or superficial siderosis.

SKULL AND UPPER CERVICAL SPINE: Normal calvarium and skull base.
Visualized upper cervical spine and soft tissues are normal.

SINUSES/ORBITS: No paranasal sinus fluid levels or advanced mucosal
thickening. No mastoid or middle ear effusion. Normal orbits.
IMPRESSION: Normal aging brain.

## 2020-06-28 NOTE — Procedures (Addendum)
ELECTROENCEPHALOGRAM REPORT  Dates of Recording: 06/14/2020 10:06AM to 06/15/2020 10:15AM Patient's Name: Misty Price MRN: [id number] Date of Birth: [birth year]  Referring Provider: Dr. Patrcia Dolly  Procedure: 24-hour ambulatory video EEG  History: This is an 85 year old woman with recurrent syncope. EEG for classification.  Medications:  Keppra Aspirin Aricept Lisinopril Tradjenta Zoloft  Technical Summary: This is a 24-hour multichannel digital video EEG recording measured by the international 10-20 system with electrodes applied with paste and impedances below 5000 ohms performed as portable with EKG monitoring.  The digital EEG was referentially recorded, reformatted, and digitally filtered in a variety of bipolar and referential montages for optimal display.    DESCRIPTION OF RECORDING: During maximal wakefulness, the background activity consisted of a symmetric 9 Hz posterior dominant rhythm which was reactive to eye opening.  There were no epileptiform discharges or focal slowing seen in wakefulness.  During the recording, the patient progresses through wakefulness, drowsiness, and Stage 2 sleep.  Again, there were no epileptiform discharges seen.  Events: On 1/19 at 1045 hours, she said she was going to pass out leaving the office. Patient not on video. Electrographically, there were no EEG or EKG changes seen.  There were no electrographic seizures seen.  EKG lead was unremarkable.  IMPRESSION: This 24-hour ambulatory video EEG study is normal.    CLINICAL CORRELATION: A normal EEG does not exclude a clinical diagnosis of epilepsy. Typical events were not captured. Episode where she felt like she was going to pass out did not show any EEG correlate.  If further clinical questions remain, inpatient video EEG monitoring may be helpful.   Patrcia Dolly, M.D.

## 2020-07-10 ENCOUNTER — Other Ambulatory Visit: Payer: Self-pay

## 2020-07-10 ENCOUNTER — Ambulatory Visit: Payer: Medicare HMO | Admitting: Neurology

## 2020-07-10 ENCOUNTER — Encounter: Payer: Self-pay | Admitting: Neurology

## 2020-07-10 VITALS — BP 127/77 | HR 97 | Ht 62.0 in | Wt 157.8 lb

## 2020-07-10 DIAGNOSIS — G301 Alzheimer's disease with late onset: Secondary | ICD-10-CM | POA: Diagnosis not present

## 2020-07-10 DIAGNOSIS — F0280 Dementia in other diseases classified elsewhere without behavioral disturbance: Secondary | ICD-10-CM

## 2020-07-10 DIAGNOSIS — R404 Transient alteration of awareness: Secondary | ICD-10-CM | POA: Diagnosis not present

## 2020-07-10 MED ORDER — LEVETIRACETAM 500 MG PO TABS: ORAL_TABLET | ORAL | 3 refills | Status: DC
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
## 2020-07-10 NOTE — Patient Instructions (Signed)
1. Continue Keppra (Levetiracetam) 500mg : Take 1 tablet twice a day  2. Continue all your other medications  3. Continue 24/7 care care  4. Follow-up in 6-7 months, call for any changes   Seizure Precautions: 1. If medication has been prescribed for you to prevent seizures, take it exactly as directed.  Do not stop taking the medicine without talking to your doctor first, even if you have not had a seizure in a long time.   2. Avoid activities in which a seizure would cause danger to yourself or to others.  Don't operate dangerous machinery, swim alone, or climb in high or dangerous places, such as on ladders, roofs, or girders.  Do not drive unless your doctor says you may.  3. If you have any warning that you may have a seizure, lay down in a safe place where you can't hurt yourself.    4.  No driving for 6 months from last seizure, as per [facility].   Please refer to the following link on the [employer]ile[employer] of [employer]'s website for more information: http://www.epilepsyfoundation.org/answerplace/Social/driving/drivingu.cfm   5.  Maintain good sleep hygiene.  6.  Contact your doctor if you have any problems that may be related to the medicine you are taking.  7.  Call 911 and bring the patient back to the ED if:        A.  The seizure lasts longer than 5 minutes.       B.  The patient doesn't awaken shortly after the seizure  C.  The patient has new problems such as difficulty seeing, speaking or moving  D.  The patient was injured during the seizure  E.  The patient has a temperature over 102 F (39C)  F.  The patient vomited and now is having trouble breathing

## 2020-07-11 ENCOUNTER — Other Ambulatory Visit: Payer: Self-pay

## 2020-07-11 ENCOUNTER — Emergency Department (HOSPITAL_COMMUNITY): Payer: Medicare HMO

## 2020-07-11 ENCOUNTER — Inpatient Hospital Stay (HOSPITAL_COMMUNITY)
Admission: EM | Admit: 2020-07-11 | Discharge: 2020-07-18 | DRG: 481 | Disposition: A | Discharge status: Skilled Nursing Facility | Payer: Medicare HMO | Attending: Internal Medicine | Admitting: Internal Medicine

## 2020-07-11 ENCOUNTER — Encounter (HOSPITAL_COMMUNITY): Payer: Self-pay | Admitting: Emergency Medicine

## 2020-07-11 DIAGNOSIS — E785 Hyperlipidemia, unspecified: Secondary | ICD-10-CM | POA: Diagnosis present

## 2020-07-11 DIAGNOSIS — Z794 Long term (current) use of insulin: Secondary | ICD-10-CM

## 2020-07-11 DIAGNOSIS — R7401 Elevation of levels of liver transaminase levels: Secondary | ICD-10-CM

## 2020-07-11 DIAGNOSIS — W010XXA Fall on same level from slipping, tripping and stumbling without subsequent striking against object, initial encounter: Secondary | ICD-10-CM | POA: Diagnosis present

## 2020-07-11 DIAGNOSIS — E538 Deficiency of other specified B group vitamins: Secondary | ICD-10-CM | POA: Diagnosis present

## 2020-07-11 DIAGNOSIS — Z79899 Other long term (current) drug therapy: Secondary | ICD-10-CM | POA: Diagnosis not present

## 2020-07-11 DIAGNOSIS — R079 Chest pain, unspecified: Secondary | ICD-10-CM | POA: Diagnosis not present

## 2020-07-11 DIAGNOSIS — Y92009 Unspecified place in unspecified non-institutional (private) residence as the place of occurrence of the external cause: Secondary | ICD-10-CM | POA: Diagnosis not present

## 2020-07-11 DIAGNOSIS — E860 Dehydration: Secondary | ICD-10-CM | POA: Diagnosis present

## 2020-07-11 DIAGNOSIS — I517 Cardiomegaly: Secondary | ICD-10-CM | POA: Diagnosis not present

## 2020-07-11 DIAGNOSIS — S72142A Displaced intertrochanteric fracture of left femur, initial encounter for closed fracture: Secondary | ICD-10-CM | POA: Diagnosis present

## 2020-07-11 DIAGNOSIS — E118 Type 2 diabetes mellitus with unspecified complications: Principal | ICD-10-CM

## 2020-07-11 DIAGNOSIS — M25552 Pain in left hip: Secondary | ICD-10-CM | POA: Diagnosis present

## 2020-07-11 DIAGNOSIS — I10 Essential (primary) hypertension: Secondary | ICD-10-CM | POA: Diagnosis present

## 2020-07-11 DIAGNOSIS — E1165 Type 2 diabetes mellitus with hyperglycemia: Secondary | ICD-10-CM | POA: Diagnosis present

## 2020-07-11 DIAGNOSIS — F0390 Unspecified dementia without behavioral disturbance: Secondary | ICD-10-CM | POA: Diagnosis present

## 2020-07-11 DIAGNOSIS — Z96653 Presence of artificial knee joint, bilateral: Secondary | ICD-10-CM | POA: Diagnosis present

## 2020-07-11 DIAGNOSIS — D519 Vitamin B12 deficiency anemia, unspecified: Secondary | ICD-10-CM | POA: Diagnosis not present

## 2020-07-11 DIAGNOSIS — E1159 Type 2 diabetes mellitus with other circulatory complications: Secondary | ICD-10-CM

## 2020-07-11 DIAGNOSIS — Z7982 Long term (current) use of aspirin: Secondary | ICD-10-CM

## 2020-07-11 DIAGNOSIS — E1151 Type 2 diabetes mellitus with diabetic peripheral angiopathy without gangrene: Secondary | ICD-10-CM | POA: Diagnosis present

## 2020-07-11 DIAGNOSIS — Z87891 Personal history of nicotine dependence: Secondary | ICD-10-CM

## 2020-07-11 DIAGNOSIS — Z043 Encounter for examination and observation following other accident: Secondary | ICD-10-CM | POA: Diagnosis not present

## 2020-07-11 DIAGNOSIS — G309 Alzheimer's disease, unspecified: Secondary | ICD-10-CM | POA: Diagnosis not present

## 2020-07-11 DIAGNOSIS — D72828 Other elevated white blood cell count: Secondary | ICD-10-CM | POA: Diagnosis present

## 2020-07-11 DIAGNOSIS — S72142D Displaced intertrochanteric fracture of left femur, subsequent encounter for closed fracture with routine healing: Secondary | ICD-10-CM | POA: Diagnosis not present

## 2020-07-11 DIAGNOSIS — W19XXXA Unspecified fall, initial encounter: Secondary | ICD-10-CM

## 2020-07-11 DIAGNOSIS — D72829 Elevated white blood cell count, unspecified: Secondary | ICD-10-CM

## 2020-07-11 DIAGNOSIS — D62 Acute posthemorrhagic anemia: Secondary | ICD-10-CM | POA: Diagnosis not present

## 2020-07-11 DIAGNOSIS — S72002A Fracture of unspecified part of neck of left femur, initial encounter for closed fracture: Secondary | ICD-10-CM | POA: Diagnosis not present

## 2020-07-11 DIAGNOSIS — R110 Nausea: Secondary | ICD-10-CM | POA: Diagnosis not present

## 2020-07-11 DIAGNOSIS — S7292XA Unspecified fracture of left femur, initial encounter for closed fracture: Secondary | ICD-10-CM

## 2020-07-11 DIAGNOSIS — R9431 Abnormal electrocardiogram [ECG] [EKG]: Secondary | ICD-10-CM | POA: Diagnosis not present

## 2020-07-11 DIAGNOSIS — F0280 Dementia in other diseases classified elsewhere without behavioral disturbance: Secondary | ICD-10-CM | POA: Diagnosis not present

## 2020-07-11 DIAGNOSIS — Z20822 Contact with and (suspected) exposure to covid-19: Secondary | ICD-10-CM | POA: Diagnosis present

## 2020-07-11 DIAGNOSIS — S72143A Displaced intertrochanteric fracture of unspecified femur, initial encounter for closed fracture: Secondary | ICD-10-CM

## 2020-07-11 DIAGNOSIS — N179 Acute kidney failure, unspecified: Secondary | ICD-10-CM | POA: Diagnosis present

## 2020-07-11 LAB — COMPREHENSIVE METABOLIC PANEL
ALT: 66 U/L — ABNORMAL HIGH (ref 0–44)
AST: 185 U/L — ABNORMAL HIGH (ref 15–41)
Albumin: 3.5 g/dL (ref 3.5–5.0)
Alkaline Phosphatase: 114 U/L (ref 38–126)
Anion gap: 7 (ref 5–15)
BUN: 24 mg/dL — ABNORMAL HIGH (ref 8–23)
CO2: 27 mmol/L (ref 22–32)
Calcium: 9 mg/dL (ref 8.9–10.3)
Chloride: 103 mmol/L (ref 98–111)
Creatinine, Ser: 1.05 mg/dL — ABNORMAL HIGH (ref 0.44–1.00)
GFR, Estimated: 53 mL/min — ABNORMAL LOW (ref >60)
Glucose, Bld: 278 mg/dL — ABNORMAL HIGH (ref 70–99)
Potassium: 4.1 mmol/L (ref 3.5–5.1)
Sodium: 137 mmol/L (ref 135–145)
Total Bilirubin: 0.7 mg/dL (ref 0.3–1.2)
Total Protein: 6.3 g/dL — ABNORMAL LOW (ref 6.5–8.1)

## 2020-07-11 LAB — CBC WITH DIFFERENTIAL/PLATELET
Abs Immature Granulocytes: 0.09 10*3/uL — ABNORMAL HIGH (ref 0.00–0.07)
Basophils Absolute: 0.1 10*3/uL (ref 0.0–0.1)
Basophils Relative: 0 %
Eosinophils Absolute: 0.1 10*3/uL (ref 0.0–0.5)
Eosinophils Relative: 1 %
HCT: 36.6 % (ref 36.0–46.0)
Hemoglobin: 11.9 g/dL — ABNORMAL LOW (ref 12.0–15.0)
Immature Granulocytes: 1 %
Lymphocytes Relative: 17 %
Lymphs Abs: 2.1 10*3/uL (ref 0.7–4.0)
MCH: 31.6 pg (ref 26.0–34.0)
MCHC: 32.5 g/dL (ref 30.0–36.0)
MCV: 97.1 fL (ref 80.0–100.0)
Monocytes Absolute: 0.7 10*3/uL (ref 0.1–1.0)
Monocytes Relative: 6 %
Neutro Abs: 9.1 10*3/uL — ABNORMAL HIGH (ref 1.7–7.7)
Neutrophils Relative %: 75 %
Platelets: 166 10*3/uL (ref 150–400)
RBC: 3.77 MIL/uL — ABNORMAL LOW (ref 3.87–5.11)
RDW: 13 % (ref 11.5–15.5)
WBC: 12.2 10*3/uL — ABNORMAL HIGH (ref 4.0–10.5)
nRBC: 0 % (ref 0.0–0.2)

## 2020-07-11 MED ORDER — MORPHINE SULFATE (PF) 4 MG/ML IV SOLN
4.0000 mg | Freq: Once | INTRAVENOUS | Status: AC
  Administered 2020-07-11: 4 mg via INTRAVENOUS
  Filled 2020-07-11: qty 1

## 2020-07-11 NOTE — ED Triage Notes (Signed)
Pt was a witnessed fall @ home. No LOC reported. Pt denies hitting head. Per EMS, family states when pt fell; she landed on L hip. L lower extremity shortened and externally rotated. EMS gave pt 4mg  Morphine en route for pain control.

## 2020-07-11 NOTE — ED Provider Notes (Signed)
[facility] EMERGENCY DEPARTMENT Provider Note   CSN: [id number] Arrival date & time: 07/11/20  2140     History Chief Complaint  Patient presents with  . Hip Pain    Misty Price is a 85 y.o. female.  Patient slipped and fell on her left hip.  Patient complains significant pain to left hip.  She was given pain medicine by the paramedics  The history is provided by the patient and medical records. No language interpreter was used.  Hip Pain This is a new problem. The current episode started 1 to 2 hours ago. The problem occurs rarely. The problem has been resolved. Pertinent negatives include no chest pain, no abdominal pain and no headaches. Exacerbated by: Movement left hip. She has tried nothing for the symptoms. The treatment provided no relief.       Past Medical History:  Diagnosis Date  . Dementia (HCC)   . Diabetes mellitus without complication (HCC)   . Syncopal episodes     Patient Active Problem List   Diagnosis Date Noted  . Diabetes mellitus type 2, controlled, with complications (HCC) 09/27/2019  . Benign essential HTN 09/27/2019  . Fall 09/26/2019  . Diabetes mellitus (HCC) 09/26/2019  . Dementia (HCC) 09/26/2019  . Syncope 09/26/2019    Past Surgical History:  Procedure Laterality Date  . JOINT REPLACEMENT     bilateral knee replacement     OB History   No obstetric history on file.     Family History  Problem Relation Age of Onset  . Hypertension Mother   . Heart attack Father     Social History   Tobacco Use  . Smoking status: Former Games developer  . Smokeless tobacco: Never Used  Vaping Use  . Vaping Use: Never used  Substance Use Topics  . Alcohol use: Never  . Drug use: Never    Home Medications Prior to Admission medications   Medication Sig Start Date End Date Taking? Authorizing Provider  aspirin EC 81 MG tablet Take 81 mg by mouth daily.    [provider]  atorvastatin (LIPITOR) 40 MG tablet atorvastatin 40 mg  tablet  TAKE 1 TABLET BY MOUTH IN THE EVENING FOR HIGH CHOLESTEROL    [provider]  cyanocobalamin (,VITAMIN B-12,) 1000 MCG/ML injection Inject 1 mL into the muscle every 30 (thirty) days. 12/22/19   [provider]  donepezil (ARICEPT) 10 MG tablet Take 10 mg by mouth daily.    [provider]  icosapent Ethyl (VASCEPA) 1 g capsule Vascepa 1 gram capsule    [provider]  insulin glargine (LANTUS SOLOSTAR) 100 UNIT/ML Solostar Pen Inject 8 Units into the skin at bedtime. Patient taking differently: Inject 11 Units into the skin at bedtime. 09/28/19   Laural Benes, Clanford L, MD  levETIRAcetam (KEPPRA) 500 MG tablet TAKE 1 TABLET BY MOUTH TWICE DAILY 07/10/20   Van Clines, MD  linagliptin (TRADJENTA) 5 MG TABS tablet Take 5 mg by mouth daily.    [provider]  lisinopril (ZESTRIL) 20 MG tablet Take 20 mg by mouth daily.    [provider]  memantine (NAMENDA) 10 MG tablet  04/06/20   [provider]  sertraline (ZOLOFT) 100 MG tablet Take 100 mg by mouth daily.    [provider]    Allergies    Keppra [levetiracetam]  Review of Systems   Review of Systems  Constitutional: Negative for appetite change and fatigue.  HENT: Negative for congestion, ear discharge and  sinus pressure.   Eyes: Negative for discharge.  Respiratory: Negative for cough.   Cardiovascular: Negative for chest pain.  Gastrointestinal: Negative for abdominal pain and diarrhea.  Genitourinary: Negative for frequency and hematuria.  Musculoskeletal: Negative for back pain.       Left hip pain  Skin: Negative for rash.  Neurological: Negative for seizures and headaches.  Psychiatric/Behavioral: Negative for hallucinations.    Physical Exam Updated Vital Signs BP (!) 142/57 (BP Location: Left Arm)   Pulse 91   Temp 98.1 F (36.7 C) (Oral)   Resp 18   Ht 5\' 2"  (1.575 m)   Wt 72 kg   SpO2 (!) 77%   BMI 29.03 kg/m   Physical  Exam Vitals and nursing note reviewed.  Constitutional:      Appearance: She is well-developed.     Comments: Lethargic  HENT:     Head: Normocephalic.     Nose: Nose normal.  Eyes:     General: No scleral icterus.    Extraocular Movements: EOM normal.     Conjunctiva/sclera: Conjunctivae normal.  Neck:     Thyroid: No thyromegaly.  Cardiovascular:     Rate and Rhythm: Normal rate and regular rhythm.     Heart sounds: No murmur heard. No friction rub. No gallop.   Pulmonary:     Breath sounds: No stridor. No wheezing or rales.  Chest:     Chest wall: No tenderness.  Abdominal:     General: There is no distension.     Tenderness: There is no abdominal tenderness. There is no rebound.  Musculoskeletal:        General: No edema.     Cervical back: Neck supple.     Comments: Tender left hip and deformity.  The leg is shortened and rotated  Lymphadenopathy:     Cervical: No cervical adenopathy.  Skin:    Findings: No erythema or rash.  Neurological:     Mental Status: She is oriented to person, place, and time.     Motor: No abnormal muscle tone.     Coordination: Coordination normal.  Psychiatric:        Mood and Affect: Mood and affect normal.        Behavior: Behavior normal.     ED Results / Procedures / Treatments   Labs (all labs ordered are listed, but only abnormal results are displayed) Labs Reviewed  RESP PANEL BY RT-PCR (FLU A&B, COVID) ARPGX2  CBC WITH DIFFERENTIAL/PLATELET  COMPREHENSIVE METABOLIC PANEL    EKG None  Radiology No results found.  Procedures Procedures   Medications Ordered in ED Medications - No data to display  ED Course  I have reviewed the triage vital signs and the nursing notes.  Pertinent labs & imaging results that were available during my care of the patient were reviewed by me and considered in my medical decision making (see chart for details).    MDM Rules/Calculators/A&P                          Pt with a hip  fracture.  She is admitted for surgery Final Clinical Impression(s) / ED Diagnoses Final diagnoses:  Fall    Rx / DC Orders ED Discharge Orders    None       , MD 07/12/20 1145

## 2020-07-11 NOTE — ED Notes (Signed)
Patient transported to X-ray 

## 2020-07-12 ENCOUNTER — Inpatient Hospital Stay (HOSPITAL_COMMUNITY): Payer: Medicare HMO

## 2020-07-12 DIAGNOSIS — S72142A Displaced intertrochanteric fracture of left femur, initial encounter for closed fracture: Secondary | ICD-10-CM | POA: Diagnosis present

## 2020-07-12 DIAGNOSIS — F0390 Unspecified dementia without behavioral disturbance: Secondary | ICD-10-CM | POA: Diagnosis present

## 2020-07-12 DIAGNOSIS — Y92009 Unspecified place in unspecified non-institutional (private) residence as the place of occurrence of the external cause: Secondary | ICD-10-CM | POA: Diagnosis not present

## 2020-07-12 DIAGNOSIS — D72828 Other elevated white blood cell count: Secondary | ICD-10-CM | POA: Diagnosis present

## 2020-07-12 DIAGNOSIS — R7401 Elevation of levels of liver transaminase levels: Secondary | ICD-10-CM

## 2020-07-12 DIAGNOSIS — Z7982 Long term (current) use of aspirin: Secondary | ICD-10-CM | POA: Diagnosis not present

## 2020-07-12 DIAGNOSIS — D72829 Elevated white blood cell count, unspecified: Secondary | ICD-10-CM | POA: Diagnosis not present

## 2020-07-12 DIAGNOSIS — Z794 Long term (current) use of insulin: Secondary | ICD-10-CM | POA: Diagnosis not present

## 2020-07-12 DIAGNOSIS — N179 Acute kidney failure, unspecified: Secondary | ICD-10-CM | POA: Diagnosis present

## 2020-07-12 DIAGNOSIS — E1165 Type 2 diabetes mellitus with hyperglycemia: Secondary | ICD-10-CM

## 2020-07-12 DIAGNOSIS — D62 Acute posthemorrhagic anemia: Secondary | ICD-10-CM | POA: Diagnosis not present

## 2020-07-12 DIAGNOSIS — Z20822 Contact with and (suspected) exposure to covid-19: Secondary | ICD-10-CM | POA: Diagnosis present

## 2020-07-12 DIAGNOSIS — E1151 Type 2 diabetes mellitus with diabetic peripheral angiopathy without gangrene: Secondary | ICD-10-CM | POA: Diagnosis present

## 2020-07-12 DIAGNOSIS — E538 Deficiency of other specified B group vitamins: Secondary | ICD-10-CM | POA: Diagnosis present

## 2020-07-12 DIAGNOSIS — Z96653 Presence of artificial knee joint, bilateral: Secondary | ICD-10-CM | POA: Diagnosis present

## 2020-07-12 DIAGNOSIS — I10 Essential (primary) hypertension: Secondary | ICD-10-CM | POA: Diagnosis not present

## 2020-07-12 DIAGNOSIS — Z87891 Personal history of nicotine dependence: Secondary | ICD-10-CM | POA: Diagnosis not present

## 2020-07-12 DIAGNOSIS — E118 Type 2 diabetes mellitus with unspecified complications: Secondary | ICD-10-CM | POA: Diagnosis not present

## 2020-07-12 DIAGNOSIS — W010XXA Fall on same level from slipping, tripping and stumbling without subsequent striking against object, initial encounter: Secondary | ICD-10-CM | POA: Diagnosis present

## 2020-07-12 DIAGNOSIS — M25552 Pain in left hip: Secondary | ICD-10-CM | POA: Diagnosis present

## 2020-07-12 DIAGNOSIS — E785 Hyperlipidemia, unspecified: Secondary | ICD-10-CM | POA: Diagnosis present

## 2020-07-12 DIAGNOSIS — E860 Dehydration: Secondary | ICD-10-CM | POA: Diagnosis present

## 2020-07-12 DIAGNOSIS — W19XXXA Unspecified fall, initial encounter: Secondary | ICD-10-CM

## 2020-07-12 DIAGNOSIS — Z79899 Other long term (current) drug therapy: Secondary | ICD-10-CM | POA: Diagnosis not present

## 2020-07-12 LAB — COMPREHENSIVE METABOLIC PANEL
ALT: 238 U/L — ABNORMAL HIGH (ref 0–44)
AST: 518 U/L — ABNORMAL HIGH (ref 15–41)
Albumin: 3.5 g/dL (ref 3.5–5.0)
Alkaline Phosphatase: 153 U/L — ABNORMAL HIGH (ref 38–126)
Anion gap: 11 (ref 5–15)
BUN: 27 mg/dL — ABNORMAL HIGH (ref 8–23)
CO2: 25 mmol/L (ref 22–32)
Calcium: 9 mg/dL (ref 8.9–10.3)
Chloride: 101 mmol/L (ref 98–111)
Creatinine, Ser: 1.22 mg/dL — ABNORMAL HIGH (ref 0.44–1.00)
GFR, Estimated: 44 mL/min — ABNORMAL LOW (ref >60)
Glucose, Bld: 363 mg/dL — ABNORMAL HIGH (ref 70–99)
Potassium: 4.9 mmol/L (ref 3.5–5.1)
Sodium: 137 mmol/L (ref 135–145)
Total Bilirubin: 0.9 mg/dL (ref 0.3–1.2)
Total Protein: 6 g/dL — ABNORMAL LOW (ref 6.5–8.1)

## 2020-07-12 LAB — GLUCOSE, CAPILLARY
Glucose-Capillary: 344 mg/dL — ABNORMAL HIGH (ref 70–99)
Glucose-Capillary: 316 mg/dL — ABNORMAL HIGH (ref 70–99)
Glucose-Capillary: 241 mg/dL — ABNORMAL HIGH (ref 70–99)
Glucose-Capillary: 158 mg/dL — ABNORMAL HIGH (ref 70–99)
Glucose-Capillary: 281 mg/dL — ABNORMAL HIGH (ref 70–99)

## 2020-07-12 LAB — CBC
HCT: 36 % (ref 36.0–46.0)
Hemoglobin: 11.5 g/dL — ABNORMAL LOW (ref 12.0–15.0)
MCH: 31.3 pg (ref 26.0–34.0)
MCHC: 31.9 g/dL (ref 30.0–36.0)
MCV: 97.8 fL (ref 80.0–100.0)
Platelets: 148 10*3/uL — ABNORMAL LOW (ref 150–400)
RBC: 3.68 MIL/uL — ABNORMAL LOW (ref 3.87–5.11)
RDW: 12.9 % (ref 11.5–15.5)
WBC: 9.4 10*3/uL (ref 4.0–10.5)
nRBC: 0 % (ref 0.0–0.2)

## 2020-07-12 LAB — SURGICAL PCR SCREEN
MRSA, PCR: NEGATIVE
Staphylococcus aureus: NEGATIVE

## 2020-07-12 LAB — PROTIME-INR
INR: 1.1 (ref 0.8–1.2)
Prothrombin Time: 13.8 seconds (ref 11.4–15.2)

## 2020-07-12 LAB — RESP PANEL BY RT-PCR (FLU A&B, COVID) ARPGX2
Influenza A by PCR: NEGATIVE
Influenza B by PCR: NEGATIVE
SARS Coronavirus 2 by RT PCR: NEGATIVE

## 2020-07-12 LAB — APTT: aPTT: 25 seconds (ref 24–36)

## 2020-07-12 LAB — PHOSPHORUS: Phosphorus: 5 mg/dL — ABNORMAL HIGH (ref 2.5–4.6)

## 2020-07-12 LAB — MAGNESIUM: Magnesium: 1.9 mg/dL (ref 1.7–2.4)

## 2020-07-12 MED ORDER — INSULIN GLARGINE 100 UNIT/ML ~~LOC~~ SOLN
7.0000 [IU] | Freq: Every day | SUBCUTANEOUS | Status: DC
  Administered 2020-07-12: 7 [IU] via SUBCUTANEOUS
  Filled 2020-07-12 (×3): qty 0.07

## 2020-07-12 MED ORDER — MORPHINE SULFATE (PF) 4 MG/ML IV SOLN
4.0000 mg | Freq: Once | INTRAVENOUS | Status: AC
Start: 2020-07-12 — End: 2020-07-12
  Administered 2020-07-12: 4 mg via INTRAVENOUS
  Filled 2020-07-12: qty 1

## 2020-07-12 MED ORDER — MORPHINE SULFATE (PF) 2 MG/ML IV SOLN
2.0000 mg | INTRAVENOUS | Status: DC | PRN
  Administered 2020-07-12 – 2020-07-18 (×10): 2 mg via INTRAVENOUS
  Filled 2020-07-12 (×10): qty 1

## 2020-07-12 MED ORDER — INSULIN ASPART 100 UNIT/ML ~~LOC~~ SOLN
0.0000 [IU] | Freq: Three times a day (TID) | SUBCUTANEOUS | Status: DC
  Administered 2020-07-12: 12:00:00 5 [IU] via SUBCUTANEOUS
  Administered 2020-07-12: 8 [IU] via SUBCUTANEOUS
  Administered 2020-07-12: 3 [IU] via SUBCUTANEOUS
  Administered 2020-07-13 (×2): 8 [IU] via SUBCUTANEOUS
  Administered 2020-07-14 (×2): 5 [IU] via SUBCUTANEOUS
  Administered 2020-07-14: 8 [IU] via SUBCUTANEOUS
  Administered 2020-07-15: 5 [IU] via SUBCUTANEOUS
  Administered 2020-07-15: 3 [IU] via SUBCUTANEOUS
  Administered 2020-07-15 (×2): 5 [IU] via SUBCUTANEOUS
  Administered 2020-07-16: 3 [IU] via SUBCUTANEOUS
  Administered 2020-07-16: 2 [IU] via SUBCUTANEOUS
  Administered 2020-07-16 – 2020-07-17 (×3): 3 [IU] via SUBCUTANEOUS
  Administered 2020-07-17: 5 [IU] via SUBCUTANEOUS
  Administered 2020-07-17: 2 [IU] via SUBCUTANEOUS
  Administered 2020-07-17 – 2020-07-18 (×2): 3 [IU] via SUBCUTANEOUS
  Administered 2020-07-18: 5 [IU] via SUBCUTANEOUS

## 2020-07-12 MED ORDER — INSULIN ASPART 100 UNIT/ML ~~LOC~~ SOLN
0.0000 [IU] | SUBCUTANEOUS | Status: DC
  Administered 2020-07-12: 11 [IU] via SUBCUTANEOUS

## 2020-07-12 MED ORDER — SODIUM CHLORIDE 0.9 % IV SOLN: INTRAVENOUS | Status: AC

## 2020-07-12 MED ORDER — TRANEXAMIC ACID-NACL 1000-0.7 MG/100ML-% IV SOLN
1000.0000 mg | INTRAVENOUS | Status: AC
  Administered 2020-07-13: 1000 mg via INTRAVENOUS
  Filled 2020-07-12: qty 100

## 2020-07-12 MED ORDER — ENSURE ENLIVE PO LIQD
237.0000 mL | Freq: Two times a day (BID) | ORAL | Status: DC
  Administered 2020-07-12 – 2020-07-15 (×4): 237 mL via ORAL

## 2020-07-12 MED ORDER — CEFAZOLIN SODIUM-DEXTROSE 2-4 GM/100ML-% IV SOLN
2.0000 g | INTRAVENOUS | Status: AC
  Administered 2020-07-13: 2 g via INTRAVENOUS
  Filled 2020-07-12: qty 100

## 2020-07-12 NOTE — Progress Notes (Signed)
Received report from Marcha Solders RN who indicated pt responds to name; however is confused due to Morphine received from  EMS and in the ED. Respirations documented at 12.

## 2020-07-12 NOTE — ED Provider Notes (Signed)
Signed out to me by Dr. Estell Harpin to follow-up on work-up.  Patient with a ground-level fall resulting in left hip fracture.  Remaining imaging unremarkable.  Admitted to hospitalist service.  Discussed with Dr. Dallas Schimke, on-call for orthopedics.  Keep patient n.p.o., he will see in the morning for possible surgical repair.   Gilda Crease, MD 07/12/20 [phone]

## 2020-07-12 NOTE — Consult Note (Signed)
ORTHOPAEDIC CONSULTATION  REQUESTING PHYSICIAN: Barton Dubois, MD  ASSESSMENT AND PLAN: 85 y.o. female with the following: Left Hip Intertrochanteric femur fracture  This patient requires inpatient admission to the hospitalist, to include preoperative clearance and perioperative medical management  - Weight Bearing Status/Activity: NWB Left lower extremity  - Additional recommended labs/tests: Preop Labs: CBC, BMP, PT/INR, Chest XR and EKG  -VTE Prophylaxis: Please hold prior to OR; to resume POD#1 at the discretion of the primary team  - Pain control: Recommend PO pain medications PRN; judicious use of narcotics  - Follow-up plan: F/u 10-14 days postop  -Procedures: Plan for OR once patient has been medically optimized  Plan for Left Hip Cephalomedullary nail   Plan for surgery Thursday July 13, 2020 - please keep patient NPO over night; hold DVT PPx    Chief Complaint: Left hip pain  HPI: Misty Price is a 85 y.o. female who presented to the ED for evaluation after sustaining a mechanical fall.  It was a witnessed fall at home.  She did not hit her head.  She presented yesterday evening via EMS.  She is complaining of pain in her left hip.  She has baseline dementia and is confused this morning.  She is comfortable if she is not moving her hip.  Pain medications improve her symptoms.   Past Medical History:  Diagnosis Date  . Dementia ([place])   . Diabetes mellitus without complication ([place])   . Syncopal episodes    Past Surgical History:  Procedure Laterality Date  . JOINT REPLACEMENT     bilateral knee replacement   Social History   Socioeconomic History  . Marital status: Married    Spouse name: Not on file  . Number of children: Not on file  . Years of education: Not on file  . Highest education level: Not on file  Occupational History  . Not on file  Tobacco Use  . Smoking status: Former Research scientist (life sciences)  . Smokeless tobacco: Never Used  Vaping Use  .  Vaping Use: Never used  Substance and Sexual Activity  . Alcohol use: Never  . Drug use: Never  . Sexual activity: Not on file  Other Topics Concern  . Not on file  Social History Narrative   Right handed    Lives family husband daughter and granddaughter    Social Determinants of Health   Financial Resource Strain: Not on file  Food Insecurity: Not on file  Transportation Needs: Not on file  Physical Activity: Not on file  Stress: Not on file  Social Connections: Not on file   Family History  Problem Relation Age of Onset  . Hypertension Mother   . Heart attack Father    Allergies  Allergen Reactions  . Keppra [Levetiracetam]     Daughter states pt was "out of it" after taking it about 2 or 3 times   Prior to Admission medications   Medication Sig Start Date End Date Taking? Authorizing Provider  aspirin EC 81 MG tablet Take 81 mg by mouth daily.   Yes [provider]  atorvastatin (LIPITOR) 40 MG tablet atorvastatin 40 mg tablet  TAKE 1 TABLET BY MOUTH IN THE EVENING FOR HIGH CHOLESTEROL   Yes [provider]  icosapent Ethyl (VASCEPA) 1 g capsule Vascepa 1 gram capsule   Yes [provider]  insulin glargine (LANTUS SOLOSTAR) 100 UNIT/ML Solostar Pen Inject 8 Units into the skin at bedtime. Patient taking differently: Inject 11 Units into the skin  at bedtime. 09/28/19  Yes Johnson, Clanford L, MD  levETIRAcetam (KEPPRA) 500 MG tablet TAKE 1 TABLET BY MOUTH TWICE DAILY 07/10/20  Yes Cameron Sprang, MD  linagliptin (TRADJENTA) 5 MG TABS tablet Take 5 mg by mouth daily.   Yes [provider]  lisinopril (ZESTRIL) 20 MG tablet Take 20 mg by mouth daily.   Yes [provider]  memantine (NAMENDA) 10 MG tablet Take 10 mg by mouth 2 (two) times daily. 04/06/20  Yes [provider]  sertraline (ZOLOFT) 100 MG tablet Take 100 mg by mouth daily.   Yes [provider]  sertraline (ZOLOFT) 50 MG tablet Take 50 mg by mouth  daily.   Yes [provider]   DG Chest 1 View  Result Date: 07/11/2020 CLINICAL DATA:  Hip pain EXAM: CHEST  1 VIEW COMPARISON:  09/26/2019 FINDINGS: Borderline to mild cardiomegaly. Both lungs are clear. Aortic atherosclerosis. The visualized skeletal structures are unremarkable. IMPRESSION: No active disease. Borderline to mild cardiomegaly. Electronically Signed   By: Donavan Foil M.D.   On: 07/11/2020 22:52   CT Head Wo Contrast  Result Date: 07/11/2020 CLINICAL DATA:  Fall EXAM: CT HEAD WITHOUT CONTRAST CT CERVICAL SPINE WITHOUT CONTRAST TECHNIQUE: Multidetector CT imaging of the head and cervical spine was performed following the standard protocol without intravenous contrast. Multiplanar CT image reconstructions of the cervical spine were also generated. COMPARISON:  Head CT 09/26/2019 FINDINGS: CT HEAD FINDINGS Brain: There is no mass, hemorrhage or extra-axial collection. The size and configuration of the ventricles and extra-axial CSF spaces are normal. The brain parenchyma is normal, without evidence of acute or chronic infarction. Vascular: No abnormal hyperdensity of the major intracranial arteries or dural venous sinuses. No intracranial atherosclerosis. Skull: The visualized skull base, calvarium and extracranial soft tissues are normal. Sinuses/Orbits: No fluid levels or advanced mucosal thickening of the visualized paranasal sinuses. No mastoid or middle ear effusion. The orbits are normal. CT CERVICAL SPINE FINDINGS Alignment: No static subluxation. Facets are aligned. Occipital condyles are normally positioned. Skull base and vertebrae: No acute fracture. Soft tissues and spinal canal: No prevertebral fluid or swelling. No visible canal hematoma. Disc levels: No advanced spinal canal or neural foraminal stenosis. Facets are fused on the left at C2-3 and on the right at C4-5. Upper chest: No pneumothorax, pulmonary nodule or pleural effusion. Other: Normal visualized paraspinal  cervical soft tissues. IMPRESSION: 1. No acute intracranial abnormality. 2. No acute fracture or static subluxation of the cervical spine. Electronically Signed   By: Ulyses Jarred M.D.   On: 07/11/2020 23:14   CT Cervical Spine Wo Contrast  Result Date: 07/11/2020 CLINICAL DATA:  Fall EXAM: CT HEAD WITHOUT CONTRAST CT CERVICAL SPINE WITHOUT CONTRAST TECHNIQUE: Multidetector CT imaging of the head and cervical spine was performed following the standard protocol without intravenous contrast. Multiplanar CT image reconstructions of the cervical spine were also generated. COMPARISON:  Head CT 09/26/2019 FINDINGS: CT HEAD FINDINGS Brain: There is no mass, hemorrhage or extra-axial collection. The size and configuration of the ventricles and extra-axial CSF spaces are normal. The brain parenchyma is normal, without evidence of acute or chronic infarction. Vascular: No abnormal hyperdensity of the major intracranial arteries or dural venous sinuses. No intracranial atherosclerosis. Skull: The visualized skull base, calvarium and extracranial soft tissues are normal. Sinuses/Orbits: No fluid levels or advanced mucosal thickening of the visualized paranasal sinuses. No mastoid or middle ear effusion. The orbits are normal. CT CERVICAL SPINE FINDINGS Alignment: No static subluxation.  Facets are aligned. Occipital condyles are normally positioned. Skull base and vertebrae: No acute fracture. Soft tissues and spinal canal: No prevertebral fluid or swelling. No visible canal hematoma. Disc levels: No advanced spinal canal or neural foraminal stenosis. Facets are fused on the left at C2-3 and on the right at C4-5. Upper chest: No pneumothorax, pulmonary nodule or pleural effusion. Other: Normal visualized paraspinal cervical soft tissues. IMPRESSION: 1. No acute intracranial abnormality. 2. No acute fracture or static subluxation of the cervical spine. Electronically Signed   By: Ulyses Jarred M.D.   On: 07/11/2020 23:14    DG Hip Unilat With Pelvis 2-3 Views Left  Result Date: 07/11/2020 CLINICAL DATA:  Fall with left hip pain EXAM: DG HIP (WITH OR WITHOUT PELVIS) 2-3V LEFT COMPARISON:  09/26/2019 FINDINGS: Pubic symphysis is intact. Both femoral heads project in joint. Acute left intertrochanteric fracture with displaced lesser trochanteric fracture fragment. IMPRESSION: Acute mildly comminuted left intertrochanteric fracture Electronically Signed   By: Donavan Foil M.D.   On: 07/11/2020 22:51   Family History Reviewed and non-contributory, no pertinent history of problems with bleeding or anesthesia    Review of Systems No fevers or chills No numbness or tingling No chest pain No shortness of breath     OBJECTIVE  Vitals: Patient Vitals for the past 8 hrs:  BP Temp Temp src Pulse Resp SpO2 Height Weight  07/12/20 0514 (!) 117/59 (!) 97.4 F (36.3 C) Oral 96 20 95 % -- --  07/12/20 0241 134/65 98.1 F (36.7 C) Oral 93 16 92 % -- --  07/12/20 0235 -- -- -- -- -- -- 5' 2"  (1.575 m) 69.9 kg  07/12/20 0201 -- -- -- -- -- 95 % -- --  07/12/20 0145 120/62 -- -- 93 11 98 % -- 69.9 kg  07/12/20 0000 112/67 -- -- 93 20 94 % -- --   General: confused, no acute distress Cardiovascular: Extremities are warm Respiratory: No cyanosis, no use of accessory musculature Skin: No lesions in the area of chief complaint  Neurologic: responds to light touch  Psychiatric: Confused, difficulty answering simple questions Lymphatic: No swelling obvious and reported other than the area involved in the exam below Extremities  LLE: Extremity held in a fixed position.  ROM deferred due to known fracture.  Sensation is intact distally in the sural, saphenous, DP, SP, and plantar nerve distribution. 2+ DP pulse.  Toes are WWP.  Active motion intact in the TA/EHL/GS. RLE: Sensation is intact distally in the sural, saphenous, DP, SP, and plantar nerve distribution. 2+ DP pulse.  Toes are WWP.  Active motion intact in the  TA/EHL/GS. Tolerates gentle ROM of the hip.  No pain with axial loading.     Test Results Imaging XR of the Left hip demonstrates an unstable Intertrochanteric femur fracture.  Labs cbc Recent Labs    07/11/20 2313 07/12/20 0349  WBC 12.2* 9.4  HGB 11.9* 11.5*  HCT 36.6 36.0  PLT 166 148*    Labs inflam No results for input(s): CRP in the last 72 hours.  Invalid input(s): ESR  Labs coag Recent Labs    07/12/20 0349  INR 1.1    Recent Labs    07/11/20 2313 07/12/20 0349  NA 137 137  K 4.1 4.9  CL 103 101  CO2 27 25  GLUCOSE 278* 363*  BUN 24* 27*  CREATININE 1.05* 1.22*  CALCIUM 9.0 9.0

## 2020-07-12 NOTE — Anesthesia Preprocedure Evaluation (Addendum)
Anesthesia Evaluation  Patient identified by MRN, date of birth, ID band Patient awake and Patient confused  General Assessment Comment:Patient has dementia, confused  Reviewed: Allergy & Precautions, NPO status , Patient's Chart, lab work & pertinent test results  History of Anesthesia Complications Negative for: history of anesthetic complications  Airway Mallampati: III  TM Distance: >3 FB Neck ROM: Full    Dental  (+) Dental Advisory Given, Missing   Pulmonary former smoker,    Pulmonary exam normal breath sounds clear to auscultation       Cardiovascular Exercise Tolerance: Poor hypertension, Pt. on medications + Peripheral Vascular Disease (Neck sx - carotid surgery)   Rhythm:Regular Rate:Tachycardia  1. Left ventricular ejection fraction, by estimation, is >75%. The left  ventricle has hyperdynamic function. The left ventricle has no regional  wall motion abnormalities. There is mild left ventricular hypertrophy.  Left ventricular diastolic parameters  are consistent with Grade I diastolic dysfunction (impaired relaxation).  2. Right ventricular systolic function is normal. The right ventricular  size is normal. Tricuspid regurgitation signal is inadequate for assessing  PA pressure.  3. The mitral valve is grossly normal. Trivial mitral valve  regurgitation.  4. The aortic valve is tricuspid. Aortic valve regurgitation is not  visualized. Mild aortic valve sclerosis is present, with no evidence of  aortic valve stenosis.  5. The inferior vena cava is normal in size with greater than 50%  respiratory variability, suggesting right atrial pressure of 3 mmHg.  11-Jul-2020 21:52:49 [facility] ROUTINE RECORD Sinus rhythm Inferior infarct, old Confirmed by Dewaine Conger [phone]) on 07/12/2020 9:39:42 AM   Neuro/Psych Seizures -, Well Controlled,  PSYCHIATRIC DISORDERS Dementia Syncopal episodes,  possible seizures, negative EEG for seizure activity, neurologist put her on keppra    GI/Hepatic negative GI ROS, High LFTs - discussed with Dr. Dyann Kief, cleared for the procedure.   Endo/Other  diabetes, Poorly Controlled, Type 2, Insulin Dependent  Renal/GU Renal disease (creatinine 1.23)     Musculoskeletal  (+) Arthritis  (left hip fx),   Abdominal   Peds  Hematology   Anesthesia Other Findings Syncopal episodes, possible seizures, negative EEG for seizure activity, neurologist put her on keppra High LFTs - discussed with Dr. Dyann Kief, cleared for the procedure.  Neck sx - carotid surgery  Reproductive/Obstetrics                           Anesthesia Physical Anesthesia Plan  ASA: III  Anesthesia Plan: Spinal and MAC   Post-op Pain Management:    Induction: Intravenous  PONV Risk Score and Plan: 3 and Ondansetron  Airway Management Planned: Nasal Cannula, Natural Airway and Simple Face Mask  Additional Equipment:   Intra-op Plan:   Post-operative Plan:   Informed Consent: I have reviewed the patients History and Physical, chart, labs and discussed the procedure including the risks, benefits and alternatives for the proposed anesthesia with the patient or authorized representative who has indicated his/her understanding and acceptance.     Dental advisory given and Consent reviewed with POA  Plan Discussed with: Surgeon and CRNA  Anesthesia Plan Comments: ( Discussed with Dr. Dyann Kief regarding High LFTs and IV hydration , cleared for the procedure. Possible GA with airway was explained. )       Anesthesia Quick Evaluation

## 2020-07-12 NOTE — Progress Notes (Signed)
Inpatient Diabetes Program Recommendations  AACE/ADA: New Consensus Statement on Inpatient Glycemic Control  Target Ranges:  Prepandial:   less than 140 mg/dL      Peak postprandial:   less than 180 mg/dL (1-2 hours)      Critically ill patients:  140 - 180 mg/dL   Results for TAYLON, LOUISON (MRN [id number]) as of 07/12/2020 09:19  Ref. Range 07/12/2020 06:01 07/12/2020 07:52  Glucose-Capillary Latest Ref Range: 70 - 99 mg/dL 747 (H) 185 (H)   Review of Glycemic Control  Diabetes history: DM2 Outpatient Diabetes medications: Lantus 11 units QHS, Tradjenta 5 mg daily Current orders for Inpatient glycemic control: Novolog 0-15 units TID & HS  Inpatient Diabetes Program Recommendations:    Insulin: Please consider ordering Lantus 7 units Q24H (based on 69.9 kg x 0.1 units).  Thanks, Orlando Penner, RN, MSN, CDE Diabetes Coordinator Inpatient Diabetes Program [phone] (Team Pager from 8am to 5pm)

## 2020-07-12 NOTE — H&P (Signed)
History and Physical  Misty Price TZG:[id number] DOB: [birth year] DOA: 07/11/2020  Referring physician: Lala Lund, MD PCP: Benita Stabile, MD  Patient coming from: Home  Chief Complaint: Left hip pain  HPI: Misty Price is a 85 y.o. female with medical history significant for dementia, T2DM, syncope, hypertension, vitamin B12 deficiency who presents to the emergency department due to fall sustained at home.  Patient was unable to provide history, possibly due to underlying dementia.  History was obtained from ED and ED medical record.  Per report, patient slipped and fell on the left hip, EMS was activated, pain medication was given by EMS team in route to the hospital.  ED Course:  In the emergency department, she was hemodynamically stable.  Work-up in the ED showed normocytic anemia, leukocytosis, hyperglycemia, elevated liver enzymes (AST 185, ALT 66).  SARS coronavirus 2 was negative. CT head without contrast and CT cervical spine without contrast showed no acute intracranial abnormality or any acute fracture or static subluxation of the cervical spine. Chest x-ray showed no active disease Left hip x-ray showed acute mildly comminuted left intertrochanteric fracture IV morphine 4 mg was given twice.  Orthopedic surgeon (Dr. Dallas Schimke) was consulted by ED physician and will see patient in the morning.  Hospitalist was asked to admit patient for further evaluation and management.  Review of Systems: Constitutional: Negative for chills and fever.  HENT: Negative for ear pain and sore throat.   Eyes: Negative for pain and visual disturbance.  Respiratory: Negative for cough, chest tightness and shortness of breath.   Cardiovascular: Negative for chest pain and palpitations.  Gastrointestinal: Negative for abdominal pain and vomiting.  Endocrine: Negative for polyphagia and polyuria.  Genitourinary: Negative for decreased urine volume, dysuria Musculoskeletal: Positive for  left hip pain.  Negative for back pain. Skin: Negative for color change and rash.  Allergic/Immunologic: Negative for immunocompromised state.  Neurological: Negative for tremors, syncope, speech difficulty Hematological: Does not bruise/bleed easily.  All other systems reviewed and are negative   Past Medical History:  Diagnosis Date  . Dementia (HCC)   . Diabetes mellitus without complication (HCC)   . Syncopal episodes    Past Surgical History:  Procedure Laterality Date  . JOINT REPLACEMENT     bilateral knee replacement    Social History:  reports that she has quit smoking. She has never used smokeless tobacco. She reports that she does not drink alcohol and does not use drugs.   Allergies  Allergen Reactions  . Keppra [Levetiracetam]     Daughter states pt was "out of it" after taking it about 2 or 3 times    Family History  Problem Relation Age of Onset  . Hypertension Mother   . Heart attack Father     Prior to Admission medications   Medication Sig Start Date End Date Taking? Authorizing Provider  aspirin EC 81 MG tablet Take 81 mg by mouth daily.    [provider]  atorvastatin (LIPITOR) 40 MG tablet atorvastatin 40 mg tablet  TAKE 1 TABLET BY MOUTH IN THE EVENING FOR HIGH CHOLESTEROL    [provider]  cyanocobalamin (,VITAMIN B-12,) 1000 MCG/ML injection Inject 1 mL into the muscle every 30 (thirty) days. 12/22/19   [provider]  donepezil (ARICEPT) 10 MG tablet Take 10 mg by mouth daily.    [provider]  icosapent Ethyl (VASCEPA) 1 g capsule Vascepa 1 gram capsule    [provider]  insulin glargine (LANTUS SOLOSTAR)  100 UNIT/ML Solostar Pen Inject 8 Units into the skin at bedtime. Patient taking differently: Inject 11 Units into the skin at bedtime. 09/28/19   Laural BenesJohnson, Clanford L, MD  levETIRAcetam (KEPPRA) 500 MG tablet TAKE 1 TABLET BY MOUTH TWICE DAILY 07/10/20   Van ClinesAquino, Karen M, MD  linagliptin  (TRADJENTA) 5 MG TABS tablet Take 5 mg by mouth daily.    [provider]  lisinopril (ZESTRIL) 20 MG tablet Take 20 mg by mouth daily.    [provider]  memantine (NAMENDA) 10 MG tablet  04/06/20   [provider]  sertraline (ZOLOFT) 100 MG tablet Take 100 mg by mouth daily.    [provider]    Physical Exam: BP 134/65 (BP Location: Left Arm)   Pulse 93   Temp 98.1 F (36.7 C) (Oral)   Resp 16   Ht 5\' 2"  (1.575 m)   Wt 69.9 kg   SpO2 92%   BMI 28.19 kg/m   . General: 85 y.o. year-old female well developed well nourished in no acute distress.  HEENT: NCAT, EOMI . Neck: Supple, trachea medial . Cardiovascular: Regular rate and rhythm with no rubs or gallops.  No thyromegaly or JVD noted.  No lower extremity edema. 2/4 pulses in all 4 extremities. Marland Kitchen. Respiratory: Clear to auscultation with no wheezes or rales. Good inspiratory effort. . Abdomen: Soft nontender nondistended with normal bowel sounds x4 quadrants. . Muskuloskeletal: Tender to palpation of left hip.  Restricted range of motion due to left hip pain. Marland Kitchen. Neuro: CN II-XII intact, sensation, reflexes intact . Skin: No ulcerative lesions noted or rashes . Psychiatry: Mood is appropriate for condition and setting          Labs on Admission:  Basic Metabolic Panel: Recent Labs  Lab 07/11/20 2313  NA 137  K 4.1  CL 103  CO2 27  GLUCOSE 278*  BUN 24*  CREATININE 1.05*  CALCIUM 9.0   Liver Function Tests: Recent Labs  Lab 07/11/20 2313  AST 185*  ALT 66*  ALKPHOS 114  BILITOT 0.7  PROT 6.3*  ALBUMIN 3.5   No results for input(s): LIPASE, AMYLASE in the last 168 hours. No results for input(s): AMMONIA in the last 168 hours. CBC: Recent Labs  Lab 07/11/20 2313 07/12/20 0349  WBC 12.2* 9.4  NEUTROABS 9.1*  --   HGB 11.9* 11.5*  HCT 36.6 36.0  MCV 97.1 97.8  PLT 166 148*   Cardiac Enzymes: No results for input(s): CKTOTAL, CKMB, CKMBINDEX, TROPONINI in the last  168 hours.  BNP (last 3 results) No results for input(s): BNP in the last 8760 hours.  ProBNP (last 3 results) No results for input(s): PROBNP in the last 8760 hours.  CBG: No results for input(s): GLUCAP in the last 168 hours.  Radiological Exams on Admission: DG Chest 1 View  Result Date: 07/11/2020 CLINICAL DATA:  Hip pain EXAM: CHEST  1 VIEW COMPARISON:  09/26/2019 FINDINGS: Borderline to mild cardiomegaly. Both lungs are clear. Aortic atherosclerosis. The visualized skeletal structures are unremarkable. IMPRESSION: No active disease. Borderline to mild cardiomegaly. Electronically Signed   By: Jasmine PangKim  Fujinaga M.D.   On: 07/11/2020 22:52   CT Head Wo Contrast  Result Date: 07/11/2020 CLINICAL DATA:  Fall EXAM: CT HEAD WITHOUT CONTRAST CT CERVICAL SPINE WITHOUT CONTRAST TECHNIQUE: Multidetector CT imaging of the head and cervical spine was performed following the standard protocol without intravenous contrast. Multiplanar CT image reconstructions of the cervical spine were also generated. COMPARISON:  Head CT 09/26/2019 FINDINGS: CT HEAD FINDINGS Brain: There is no mass, hemorrhage or extra-axial collection. The size and configuration of the ventricles and extra-axial CSF spaces are normal. The brain parenchyma is normal, without evidence of acute or chronic infarction. Vascular: No abnormal hyperdensity of the major intracranial arteries or dural venous sinuses. No intracranial atherosclerosis. Skull: The visualized skull base, calvarium and extracranial soft tissues are normal. Sinuses/Orbits: No fluid levels or advanced mucosal thickening of the visualized paranasal sinuses. No mastoid or middle ear effusion. The orbits are normal. CT CERVICAL SPINE FINDINGS Alignment: No static subluxation. Facets are aligned. Occipital condyles are normally positioned. Skull base and vertebrae: No acute fracture. Soft tissues and spinal canal: No prevertebral fluid or swelling. No visible canal hematoma. Disc  levels: No advanced spinal canal or neural foraminal stenosis. Facets are fused on the left at C2-3 and on the right at C4-5. Upper chest: No pneumothorax, pulmonary nodule or pleural effusion. Other: Normal visualized paraspinal cervical soft tissues. IMPRESSION: 1. No acute intracranial abnormality. 2. No acute fracture or static subluxation of the cervical spine. Electronically Signed   By: Deatra Robinson M.D.   On: 07/11/2020 23:14   CT Cervical Spine Wo Contrast  Result Date: 07/11/2020 CLINICAL DATA:  Fall EXAM: CT HEAD WITHOUT CONTRAST CT CERVICAL SPINE WITHOUT CONTRAST TECHNIQUE: Multidetector CT imaging of the head and cervical spine was performed following the standard protocol without intravenous contrast. Multiplanar CT image reconstructions of the cervical spine were also generated. COMPARISON:  Head CT 09/26/2019 FINDINGS: CT HEAD FINDINGS Brain: There is no mass, hemorrhage or extra-axial collection. The size and configuration of the ventricles and extra-axial CSF spaces are normal. The brain parenchyma is normal, without evidence of acute or chronic infarction. Vascular: No abnormal hyperdensity of the major intracranial arteries or dural venous sinuses. No intracranial atherosclerosis. Skull: The visualized skull base, calvarium and extracranial soft tissues are normal. Sinuses/Orbits: No fluid levels or advanced mucosal thickening of the visualized paranasal sinuses. No mastoid or middle ear effusion. The orbits are normal. CT CERVICAL SPINE FINDINGS Alignment: No static subluxation. Facets are aligned. Occipital condyles are normally positioned. Skull base and vertebrae: No acute fracture. Soft tissues and spinal canal: No prevertebral fluid or swelling. No visible canal hematoma. Disc levels: No advanced spinal canal or neural foraminal stenosis. Facets are fused on the left at C2-3 and on the right at C4-5. Upper chest: No pneumothorax, pulmonary nodule or pleural effusion. Other: Normal  visualized paraspinal cervical soft tissues. IMPRESSION: 1. No acute intracranial abnormality. 2. No acute fracture or static subluxation of the cervical spine. Electronically Signed   By: Deatra Robinson M.D.   On: 07/11/2020 23:14   DG Hip Unilat With Pelvis 2-3 Views Left  Result Date: 07/11/2020 CLINICAL DATA:  Fall with left hip pain EXAM: DG HIP (WITH OR WITHOUT PELVIS) 2-3V LEFT COMPARISON:  09/26/2019 FINDINGS: Pubic symphysis is intact. Both femoral heads project in joint. Acute left intertrochanteric fracture with displaced lesser trochanteric fracture fragment. IMPRESSION: Acute mildly comminuted left intertrochanteric fracture Electronically Signed   By: Jasmine Pang M.D.   On: 07/11/2020 22:51    EKG: I independently viewed the EKG done and my findings are as followed: Normal sinus rhythm at rate of 90 bpm  Assessment/Plan Present on Admission: . Closed comminuted intertrochanteric fracture of proximal end of left femur (HCC) . Fall  Principal Problem:   Closed comminuted intertrochanteric fracture of proximal end of left femur [facility]) Active Problems:   Fall  Leukocytosis   Hyperglycemia due to diabetes mellitus (HCC)   Transaminitis  Acute mildly comminuted left intertrochanteric fracture secondary to accidental fall Left hip x-ray showed acute mildly comminuted left intertrochanteric fracture Continue IV morphine 2 mg every 4 hours as needed Continue n.p.o. in anticipation for possible surgical intervention in the morning Continue fall precaution and neurochecks Continue PT/OT eval and treat ED physician already consulted orthopedic surgeon who will see patient in the morning per ED medical record  Hyperglycemia secondary to T2DM Continue ISS and hypoglycemic control Remember to hold off on ISS and give half dose of basal insulin on the morning of surgical procedure   Leukocytosis possibly reactive WBC 12.2; no sign of any obvious acute infection Currently monitor  WBC with morning labs  Transaminitis  AST 185, ALT 66, patient denies any abdominal pain Continue to monitor liver enzymes with morning labs  Essential hypertension Continue home meds when patient resumes oral intake  Hyperlipidemia Statin will be held at this time due to elevated liver enzymes  Vitamin B12 deficiency  Patient injects vitamin B12 every 30 days  Dementia Continue home meds when patient resumes oral intake  DVT prophylaxis: SCDs  Code Status: Full code  Family Communication: None at bedside  Disposition Plan:  Patient is from:                        home Anticipated DC to:                   SNF or family members home Anticipated DC date:               2-3 days Anticipated DC barriers:           Patient requires inpatient management due to left intertrochanteric fracture requiring possible surgical repair   Consults called: Orthopedic surgery  Admission status: Inpatient    Frankey Shown MD [facility]  07/12/2020, 4:53 AM

## 2020-07-12 NOTE — Progress Notes (Signed)
Patient seen and examined.  Admitted after midnight secondary to mechanical fall resulting in left hip fracture.  Patient hemodynamically stable, reporting pain in her hip with any movement.  No chest pain, no nausea, no vomiting, no shortness of breath, no other complaints.  Please refer to H&P written by Dr. Thomes Dinning for further info/details on admission.  Plan: -Continue supportive care analgesic medications -Follow orthopedic service recommendation -Patient with moderate to severe risk based on her age and comorbidities; no need for further inpatient work-up prior to surgery. -N.p.o. after midnight for anticipated surgical repair on 07/13/2020. -Follow evaluation by PT/OT after surgery completed, to assess next venue for recovery and strengthening.   Vassie Loll MD [phone]

## 2020-07-12 NOTE — Progress Notes (Signed)
Informed consent signed. Telephone consent from daughter, Norina Buzzard. Two nurses verified.

## 2020-07-13 ENCOUNTER — Inpatient Hospital Stay (HOSPITAL_COMMUNITY): Payer: Medicare HMO

## 2020-07-13 ENCOUNTER — Inpatient Hospital Stay (HOSPITAL_COMMUNITY): Payer: Medicare HMO | Admitting: Anesthesiology

## 2020-07-13 ENCOUNTER — Encounter (HOSPITAL_COMMUNITY): Payer: Self-pay | Admitting: Internal Medicine

## 2020-07-13 ENCOUNTER — Encounter (HOSPITAL_COMMUNITY)
Admission: EM | Disposition: A | Discharge status: Skilled Nursing Facility | Payer: Self-pay | Source: Home / Self Care | Attending: Internal Medicine

## 2020-07-13 DIAGNOSIS — S72142A Displaced intertrochanteric fracture of left femur, initial encounter for closed fracture: Secondary | ICD-10-CM | POA: Diagnosis not present

## 2020-07-13 DIAGNOSIS — W19XXXA Unspecified fall, initial encounter: Secondary | ICD-10-CM | POA: Diagnosis not present

## 2020-07-13 DIAGNOSIS — N179 Acute kidney failure, unspecified: Secondary | ICD-10-CM

## 2020-07-13 DIAGNOSIS — D72829 Elevated white blood cell count, unspecified: Secondary | ICD-10-CM

## 2020-07-13 DIAGNOSIS — R7401 Elevation of levels of liver transaminase levels: Secondary | ICD-10-CM

## 2020-07-13 DIAGNOSIS — E1165 Type 2 diabetes mellitus with hyperglycemia: Secondary | ICD-10-CM

## 2020-07-13 HISTORY — PX: INTRAMEDULLARY (IM) NAIL INTERTROCHANTERIC: SHX5875

## 2020-07-13 LAB — GLUCOSE, CAPILLARY
Glucose-Capillary: 255 mg/dL — ABNORMAL HIGH (ref 70–99)
Glucose-Capillary: 225 mg/dL — ABNORMAL HIGH (ref 70–99)
Glucose-Capillary: 239 mg/dL — ABNORMAL HIGH (ref 70–99)
Glucose-Capillary: 274 mg/dL — ABNORMAL HIGH (ref 70–99)
Glucose-Capillary: 254 mg/dL — ABNORMAL HIGH (ref 70–99)

## 2020-07-13 LAB — COMPREHENSIVE METABOLIC PANEL
ALT: 130 U/L — ABNORMAL HIGH (ref 0–44)
AST: 81 U/L — ABNORMAL HIGH (ref 15–41)
Albumin: 3.2 g/dL — ABNORMAL LOW (ref 3.5–5.0)
Alkaline Phosphatase: 119 U/L (ref 38–126)
Anion gap: 10 (ref 5–15)
BUN: 24 mg/dL — ABNORMAL HIGH (ref 8–23)
CO2: 25 mmol/L (ref 22–32)
Calcium: 8.9 mg/dL (ref 8.9–10.3)
Chloride: 103 mmol/L (ref 98–111)
Creatinine, Ser: 0.98 mg/dL (ref 0.44–1.00)
GFR, Estimated: 58 mL/min — ABNORMAL LOW (ref >60)
Glucose, Bld: 238 mg/dL — ABNORMAL HIGH (ref 70–99)
Potassium: 4 mmol/L (ref 3.5–5.1)
Sodium: 138 mmol/L (ref 135–145)
Total Bilirubin: 0.7 mg/dL (ref 0.3–1.2)
Total Protein: 5.8 g/dL — ABNORMAL LOW (ref 6.5–8.1)

## 2020-07-13 LAB — HEMOGLOBIN A1C
Hgb A1c MFr Bld: 10.5 % — ABNORMAL HIGH (ref 4.8–5.6)
Mean Plasma Glucose: 255 mg/dL

## 2020-07-13 SURGERY — FIXATION, FRACTURE, INTERTROCHANTERIC, WITH INTRAMEDULLARY ROD
Anesthesia: Monitor Anesthesia Care | Site: Hip | Laterality: Left

## 2020-07-13 MED ORDER — CHLORHEXIDINE GLUCONATE CLOTH 2 % EX PADS
6.0000 | MEDICATED_PAD | Freq: Every day | CUTANEOUS | Status: DC
  Administered 2020-07-13 – 2020-07-18 (×6): 6 via TOPICAL

## 2020-07-13 MED ORDER — MEPERIDINE HCL 50 MG/ML IJ SOLN: 6.2500 mg | INTRAMUSCULAR | Status: DC | PRN

## 2020-07-13 MED ORDER — SODIUM CHLORIDE 0.9 % IV SOLN: INTRAVENOUS | Status: DC | PRN

## 2020-07-13 MED ORDER — PHENYLEPHRINE HCL (PRESSORS) 10 MG/ML IV SOLN
INTRAVENOUS | Status: AC
  Filled 2020-07-13: qty 2

## 2020-07-13 MED ORDER — PHENYLEPHRINE HCL-NACL 20-0.9 MG/250ML-% IV SOLN
INTRAVENOUS | Status: DC | PRN
  Administered 2020-07-13: 25 ug/min via INTRAVENOUS

## 2020-07-13 MED ORDER — CHLORHEXIDINE GLUCONATE 0.12 % MT SOLN
15.0000 mL | Freq: Once | OROMUCOSAL | Status: AC
  Administered 2020-07-13: 15 mL via OROMUCOSAL

## 2020-07-13 MED ORDER — BUPIVACAINE HCL (PF) 0.5 % IJ SOLN
INTRAMUSCULAR | Status: DC | PRN
  Administered 2020-07-13: 1.5 mL

## 2020-07-13 MED ORDER — PROPOFOL 500 MG/50ML IV EMUL
INTRAVENOUS | Status: DC | PRN
  Administered 2020-07-13: 35 ug/kg/min via INTRAVENOUS
  Administered 2020-07-13: 45 ug/kg/min via INTRAVENOUS

## 2020-07-13 MED ORDER — PROPOFOL 10 MG/ML IV BOLUS
INTRAVENOUS | Status: DC | PRN
  Administered 2020-07-13: 100 mg via INTRAVENOUS
  Administered 2020-07-13: 30 mg via INTRAVENOUS
  Administered 2020-07-13: 20 mg via INTRAVENOUS

## 2020-07-13 MED ORDER — FENTANYL CITRATE (PF) 100 MCG/2ML IJ SOLN
INTRAMUSCULAR | Status: DC | PRN
  Administered 2020-07-13: 25 ug via INTRAVENOUS
  Administered 2020-07-13: 50 ug via INTRAVENOUS
  Administered 2020-07-13: 25 ug via INTRAVENOUS
  Administered 2020-07-13: 10 ug via INTRAVENOUS

## 2020-07-13 MED ORDER — FENTANYL CITRATE (PF) 100 MCG/2ML IJ SOLN
INTRAMUSCULAR | Status: DC | PRN
  Administered 2020-07-13: 15 ug via INTRATHECAL

## 2020-07-13 MED ORDER — BUPIVACAINE HCL (PF) 0.5 % IJ SOLN
INTRAMUSCULAR | Status: AC
  Filled 2020-07-13: qty 30

## 2020-07-13 MED ORDER — PHENYLEPHRINE HCL (PRESSORS) 10 MG/ML IV SOLN
INTRAVENOUS | Status: DC | PRN
  Administered 2020-07-13: 80 ug via INTRAVENOUS
  Administered 2020-07-13 (×4): 120 ug via INTRAVENOUS
  Administered 2020-07-13 (×2): 40 ug via INTRAVENOUS

## 2020-07-13 MED ORDER — FENTANYL CITRATE (PF) 100 MCG/2ML IJ SOLN
INTRAMUSCULAR | Status: AC
  Filled 2020-07-13: qty 2

## 2020-07-13 MED ORDER — ONDANSETRON HCL 4 MG/2ML IJ SOLN
INTRAMUSCULAR | Status: DC | PRN
  Administered 2020-07-13: 4 mg via INTRAVENOUS

## 2020-07-13 MED ORDER — HYDROMORPHONE HCL 1 MG/ML IJ SOLN: 0.2500 mg | INTRAMUSCULAR | Status: DC | PRN

## 2020-07-13 MED ORDER — CEFAZOLIN SODIUM-DEXTROSE 2-4 GM/100ML-% IV SOLN
2.0000 g | Freq: Three times a day (TID) | INTRAVENOUS | Status: AC
  Administered 2020-07-13 – 2020-07-14 (×3): 2 g via INTRAVENOUS
  Filled 2020-07-13 (×3): qty 100

## 2020-07-13 MED ORDER — ORAL CARE MOUTH RINSE: 15.0000 mL | Freq: Once | OROMUCOSAL | Status: AC

## 2020-07-13 MED ORDER — SODIUM CHLORIDE 0.9 % IR SOLN
Status: DC | PRN
  Administered 2020-07-13: 1000 mL

## 2020-07-13 MED ORDER — INSULIN GLARGINE 100 UNIT/ML ~~LOC~~ SOLN
11.0000 [IU] | Freq: Every day | SUBCUTANEOUS | Status: DC
  Administered 2020-07-14 – 2020-07-18 (×5): 11 [IU] via SUBCUTANEOUS
  Filled 2020-07-13 (×6): qty 0.11

## 2020-07-13 MED ORDER — ONDANSETRON HCL 4 MG/2ML IJ SOLN
INTRAMUSCULAR | Status: AC
  Filled 2020-07-13: qty 2

## 2020-07-13 MED ORDER — BUPIVACAINE HCL (PF) 0.5 % IJ SOLN
INTRAMUSCULAR | Status: DC | PRN
  Administered 2020-07-13: 30 mL

## 2020-07-13 MED ORDER — LACTATED RINGERS IV SOLN: INTRAVENOUS | Status: DC

## 2020-07-13 MED ORDER — PHENYLEPHRINE 40 MCG/ML (10ML) SYRINGE FOR IV PUSH (FOR BLOOD PRESSURE SUPPORT)
PREFILLED_SYRINGE | INTRAVENOUS | Status: AC
  Filled 2020-07-13: qty 20

## 2020-07-13 MED ORDER — SODIUM CHLORIDE FLUSH 0.9 % IV SOLN
INTRAVENOUS | Status: AC
  Filled 2020-07-13: qty 10

## 2020-07-13 MED ORDER — ONDANSETRON HCL 4 MG/2ML IJ SOLN: 4.0000 mg | Freq: Once | INTRAMUSCULAR | Status: DC | PRN

## 2020-07-13 MED ORDER — DEXAMETHASONE SODIUM PHOSPHATE 10 MG/ML IJ SOLN
INTRAMUSCULAR | Status: AC
  Filled 2020-07-13: qty 1

## 2020-07-13 MED ORDER — HALOPERIDOL LACTATE 5 MG/ML IJ SOLN: 0.5000 mg | Freq: Three times a day (TID) | INTRAMUSCULAR | Status: DC | PRN

## 2020-07-13 MED ORDER — LACTATED RINGERS IV BOLUS
1000.0000 mL | Freq: Once | INTRAVENOUS | Status: AC
  Administered 2020-07-13: 1000 mL via INTRAVENOUS

## 2020-07-13 MED ORDER — PROPOFOL 10 MG/ML IV BOLUS
INTRAVENOUS | Status: AC
  Filled 2020-07-13: qty 40

## 2020-07-13 MED ORDER — LIDOCAINE HCL (PF) 2 % IJ SOLN
INTRAMUSCULAR | Status: AC
  Filled 2020-07-13: qty 5

## 2020-07-13 SURGICAL SUPPLY — 64 items
APL PRP STRL LF DISP 70% ISPRP (MISCELLANEOUS) ×1
BIT DRILL 4.0X280 (BIT) ×2 IMPLANT
BIT DRILL AO GAMMA 4.2X180 (BIT) IMPLANT
BLADE SURG SZ10 CARB STEEL (BLADE) ×4 IMPLANT
BNDG GAUZE ELAST 4 BULKY (GAUZE/BANDAGES/DRESSINGS) ×2 IMPLANT
BRUSH SCRUB EZ W/ULTRADEX 3%PC (MISCELLANEOUS) IMPLANT
CHLORAPREP W/TINT 26 (MISCELLANEOUS) ×2 IMPLANT
CLOTH BEACON ORANGE TIMEOUT ST (SAFETY) ×2 IMPLANT
COVER LIGHT HANDLE STERIS (MISCELLANEOUS) ×4 IMPLANT
COVER MAYO STAND XLG (MISCELLANEOUS) IMPLANT
COVER WAND RF STERILE (DRAPES) ×2 IMPLANT
DECANTER SPIKE VIAL GLASS SM (MISCELLANEOUS) ×2 IMPLANT
DRAPE HALF SHEET 40X57 (DRAPES) ×2 IMPLANT
DRAPE INCISE IOBAN 44X35 STRL (DRAPES) IMPLANT
DRAPE STERI IOBAN 125X83 (DRAPES) ×2 IMPLANT
DRSG MEPILEX SACRM 8.7X9.8 (GAUZE/BANDAGES/DRESSINGS) ×2 IMPLANT
DRSG PAD ABDOMINAL 8X10 ST (GAUZE/BANDAGES/DRESSINGS) ×2 IMPLANT
DRSG TEGADERM 4X4.75 (GAUZE/BANDAGES/DRESSINGS) ×8 IMPLANT
ELECT REM PT RETURN 9FT ADLT (ELECTROSURGICAL) ×2
ELECTRODE REM PT RTRN 9FT ADLT (ELECTROSURGICAL) ×1 IMPLANT
GAUZE SPONGE 4X4 12PLY STRL (GAUZE/BANDAGES/DRESSINGS) ×2 IMPLANT
GAUZE XEROFORM 1X8 LF (GAUZE/BANDAGES/DRESSINGS) IMPLANT
GLOVE BIOGEL PI IND STRL 8 (GLOVE) ×1 IMPLANT
GLOVE BIOGEL PI INDICATOR 8 (GLOVE) ×1
GLOVE SKINSENSE NS SZ8.0 LF (GLOVE) ×2
GLOVE SKINSENSE STRL SZ8.0 LF (GLOVE) ×2 IMPLANT
GLOVE SURG UNDER POLY LF SZ7 (GLOVE) ×4 IMPLANT
GOWN STRL REUS W/ TWL XL LVL3 (GOWN DISPOSABLE) ×1 IMPLANT
GOWN STRL REUS W/TWL LRG LVL3 (GOWN DISPOSABLE) ×2 IMPLANT
GOWN STRL REUS W/TWL XL LVL3 (GOWN DISPOSABLE) ×2
GUIDEROD T2 3X1000 (ROD) IMPLANT
GUIDEWIRE BALL NOSE 3.0X900 (WIRE) ×2
GUIDEWIRE ORTH 900X3XBALL NOSE (WIRE) ×1 IMPLANT
INST SET MAJOR BONE (KITS) ×2 IMPLANT
K-WIRE  3.2X450M STR (WIRE)
K-WIRE 3.2X450M STR (WIRE)
KIT BLADEGUARD II DBL (SET/KITS/TRAYS/PACK) ×2 IMPLANT
KIT TURNOVER CYSTO (KITS) ×2 IMPLANT
KWIRE 3.2X450M STR (WIRE) IMPLANT
MANIFOLD NEPTUNE II (INSTRUMENTS) ×2 IMPLANT
MARKER SKIN DUAL TIP RULER LAB (MISCELLANEOUS) ×2 IMPLANT
NAIL TROCH FEM ES 10X33X125 ×2 IMPLANT
NEEDLE HYPO 21X1.5 SAFETY (NEEDLE) ×2 IMPLANT
NS IRRIG 1000ML POUR BTL (IV SOLUTION) ×2 IMPLANT
PACK BASIC III (CUSTOM PROCEDURE TRAY) ×2
PACK SRG BSC III STRL LF ECLPS (CUSTOM PROCEDURE TRAY) ×1 IMPLANT
PAD ARMBOARD 7.5X6 YLW CONV (MISCELLANEOUS) ×2 IMPLANT
PENCIL SMOKE EVACUATOR COATED (MISCELLANEOUS) ×2 IMPLANT
PIN GUIDE THRD AR 3.2X330 (PIN) ×2 IMPLANT
SCREW LAG LFT 10.5X85 (Screw) ×2 IMPLANT
SCREW LOCK CORT 5X34 (Screw) ×2 IMPLANT
SET BASIN LINEN APH (SET/KITS/TRAYS/PACK) ×2 IMPLANT
SPONGE LAP 18X18 RF (DISPOSABLE) ×4 IMPLANT
STAPLER VISISTAT (STAPLE) IMPLANT
STRIP CLOSURE SKIN 1/2X4 (GAUZE/BANDAGES/DRESSINGS) ×8 IMPLANT
SUT MNCRL AB 4-0 PS2 18 (SUTURE) ×2 IMPLANT
SUT MON AB 2-0 CT1 36 (SUTURE) ×4 IMPLANT
SUT VIC AB 0 CT1 27 (SUTURE) ×4
SUT VIC AB 0 CT1 27XBRD ANTBC (SUTURE) ×2 IMPLANT
SYR 30ML LL (SYRINGE) ×2 IMPLANT
SYR BULB IRRIG 60ML STRL (SYRINGE) ×4 IMPLANT
TOOL ACTIVATION (INSTRUMENTS) ×2 IMPLANT
TRAY FOLEY MTR SLVR 16FR STAT (SET/KITS/TRAYS/PACK) ×2 IMPLANT
YANKAUER SUCT BULB TIP NO VENT (SUCTIONS) ×2 IMPLANT

## 2020-07-13 NOTE — Anesthesia Procedure Notes (Addendum)
Procedure Name: LMA Insertion Date/Time: 07/13/2020 9:54 AM Performed by: Lorin Glass, CRNA Pre-anesthesia Checklist: Patient identified, Emergency Drugs available, Suction available and Patient being monitored Patient Re-evaluated:Patient Re-evaluated prior to induction Oxygen Delivery Method: Circle system utilized Preoxygenation: Pre-oxygenation with 100% oxygen Induction Type: IV induction LMA: LMA inserted LMA Size: 3.0 Number of attempts: 1 Tube secured with: Tape Dental Injury: Teeth and Oropharynx as per pre-operative assessment

## 2020-07-13 NOTE — Anesthesia Procedure Notes (Signed)
Spinal  Patient location during procedure: OR Start time: 07/13/2020 9:10 AM End time: 07/13/2020 9:20 AM Staffing Performed: anesthesiologist  Anesthesiologist: Molli Barrows, MD Preanesthetic Checklist Completed: patient identified, IV checked, site marked, risks and benefits discussed, surgical consent, monitors and equipment checked, pre-op evaluation and timeout performed Spinal Block Patient position: right lateral decubitus Prep: ChloraPrep Patient monitoring: heart rate, cardiac monitor, continuous pulse ox and blood pressure Approach: midline Location: L3-4 Injection technique: single-shot Needle Needle type: Spinocan  Needle gauge: 24 G Needle length: 9 cm Needle insertion depth: 7 cm

## 2020-07-13 NOTE — Op Note (Addendum)
Orthopaedic Surgery Operative Note (CSN: [id number])  Sorcha Rotunno  1935/06/12 Date of Surgery: 07/13/2020   Diagnoses:  Left intertrochanteric femur fracture  Procedure: Cephalomedullary nail for Left intertrochanteric femur fracture   Operative Finding Successful completion of the planned procedure.  Comminuted fracture.  Displaced lesser trochanter   Post-Op Diagnosis: Same Surgeons:Primary: Oliver Barre, MD Assistants:  Cecile Sheerer Location: [facility] OR ROOM 4 Anesthesia: General with local anesthesia Antibiotics: Ancef 2 g Tourniquet time: N/A Estimated Blood Loss: 100 cc Complications: None Specimens: None Implants: Implant Name Type Inv. Item Serial No. Manufacturer Lot No. LRB No. Used Action  NAIL TROCH FEM ES [id number] - SSTERILE ON SET  NAIL TROCH FEM ES [id number] STERILE ON SET [employer]  Left 1 Implanted  LEFTY LAG SCREW 10.5X85 Screw  STERILE ON SET [employer]  Left 1 Implanted  SCREW LOCK CORT 5X34 - SSTERILE ON SET Screw SCREW LOCK CORT 5X34 STERILE ON SET [employer]  Left 1 Implanted    Indications for Surgery:   Misty Price is a 85 y.o. female who had a mechanical fall and sustained a Left intertrochanteric femur fracture.  I recommended operative fixation to restore stability and allow the patient to ambulate immediately postop.  Benefits and risks of operative and nonoperative management were discussed prior to surgery with patient/guardian(s) and informed consent form was completed.  Specific risks including infection, need for additional surgery, persistent pain, bleeding, malunion, nonunion and more severe complications associated with anesthesia.  The patient/guardian elected to proceed and surgical consent was obtained.    Procedure:   The patient was identified properly. Informed consent was obtained and the surgical site was marked. The patient was taken to the OR where general anesthesia was induced.  A spinal epidural was attempted, but this  did not provide sufficient analgesia.  The patient was placed supine on a fracture table and appropriate reduction was obtained and visualized on fluoroscopy prior to the beginning of the procedure.  Timeout was performed before the beginning of the case.  Ancef 2 g dosing was confirmed prior to making incision.  The patient received TXA prior to the start of surgery.   We made an incision proximal to the greater trochanter and dissected down through the fascia.  We then carefully placed a guidepin, localizing under fluoroscopy.  Once satisfied with the starting point, the entry reamer was used to gain entry into the intramedullary canal.  A ball tip guidewire was then introduced and passed to an appropriate level at the physeal scar of the distal femur and measurement was obtained proximally using fluoroscopy.  We selected the appropriate length of nail, as noted above.  We attempted to pass the nail without reaming, but it would not advance.  We reamed up to an 11 and then we were able to introduce the nail, localizing under fluoroscopy, and confirmed that it was at the appropriate level.  Next we used the outrigger device to pass a wire into the femoral neck, and then the cephalomedullary lag screw.  The screw was locked proximally to avoid over collapse.  We then turned our attention to the distal interlocking screw.  Once again, we used the outrigger device to place a single interlocking screw in the midshaft area.  The outrigger device was removed and final fluoroscopic images were obtained.  The wounds were thoroughly irrigated closed in a multilayer fashion with 2-0 monocryl and 4-0 monocryl.  Sterile dressings were placed.  The patient was awoken from general anesthesia  and taken to the PACU in stable condition without complication.     Post-operative plan:  Weightbearing: The patient will be WBAT on the operative extremity.   DVT prophylaxis per primary team, no orthopedic contraindications.   Recommend 81 mg Aspirin BID, unless patient cannot tolerate or was previously on anticoagulation.  Prefer to start Ppx POD#1 Pain control with PRN pain medication preferring oral medicines.   Dressing can be reinforced as needed, will change on POD#2/3 if needed.  Patient does not need to remain hospitalized for dressing change Follow up plan: approximately 2 weeks postop for incision check and XR.  If the patient will be returning to a nursing facility, the sutures can be trimmed around this time and a follow up appointment can be scheduled for 6 weeks after surgery.  The patient can have an appointment scheduled at any time if the nursing facility has concerns.  XR at next visit:  please obtain [facility] pelvis, and 2 views of the Left hip

## 2020-07-13 NOTE — Anesthesia Postprocedure Evaluation (Signed)
Anesthesia Post Note  Patient: Female Misty Price  Procedure(s) Performed: INTRAMEDULLARY (IM) NAIL INTERTROCHANTRIC (Left Hip)  Patient location during evaluation: PACU Anesthesia Type: Combined General/Spinal Level of consciousness: awake and alert Pain management: pain level controlled Vital Signs Assessment: post-procedure vital signs reviewed and stable Cardiovascular status: stable Postop Assessment: no apparent nausea or vomiting and spinal receding Anesthetic complications: no   No complications documented.   Last Vitals:  Vitals:   07/13/20 1145 07/13/20 1155  BP: (!) 83/48 (!) 104/58  Pulse: (!) 103 98  Resp: 15 16  Temp:    SpO2: 100% 100%    Last Pain:  Vitals:   07/13/20 0751  TempSrc: Oral  PainSc: Asleep                 Avarae Zwart C Nare Gaspari

## 2020-07-13 NOTE — TOC Initial Note (Signed)
Transition of Care [facility]) - Initial/Assessment Note    Patient Details  Name: Misty Price MRN: [id number] Date of Birth: [birth year]  Transition of [facility]) CM/SW Contact:    Elliot Gault, LCSW Phone Number: 07/13/2020, 1:41 PM  Clinical Narrative:                  Pt admitted from home with a hip fracture. Pt underwent surgery this AM. Anticipating she will have PT evaluation tomorrow. Will follow up after PT recommendations are made to assist with SNF vs [facility] referrals.   TOC will follow.  Expected Discharge Plan: Skilled Nursing Facility Barriers to Discharge: Continued Medical Work up   Patient Goals and CMS Choice        Expected Discharge Plan and Services Expected Discharge Plan: Skilled Nursing Facility In-house Referral: Clinical Social Work     Living arrangements for the past 2 months: Single Family Home                                      Prior Living Arrangements/Services Living arrangements for the past 2 months: Single Family Home Lives with:: Spouse Patient language and need for interpreter reviewed:: Yes        Need for Family Participation in Patient Care: Yes (Comment) Care giver support system in place?: Yes (comment)   Criminal Activity/Legal Involvement Pertinent to Current Situation/Hospitalization: No - Comment as needed  Activities of Daily Living      Permission Sought/Granted                  Emotional Assessment       Orientation: : Oriented to Self,Oriented to Place Alcohol / Substance Use: Not Applicable Psych Involvement: No (comment)  Admission diagnosis:  Fall [W19.XXXA] Closed comminuted intertrochanteric fracture of left femur, initial encounter [facility]) [S72.142A] Patient Active Problem List   Diagnosis Date Noted  . Closed comminuted intertrochanteric fracture of proximal end of left femur (HCC) 07/12/2020  . Leukocytosis 07/12/2020  . Hyperglycemia due to diabetes mellitus (HCC) 07/12/2020  .  Transaminitis 07/12/2020  . Diabetes mellitus type 2, controlled, with complications (HCC) 09/27/2019  . Benign essential HTN 09/27/2019  . Fall 09/26/2019  . Diabetes mellitus (HCC) 09/26/2019  . Dementia (HCC) 09/26/2019  . Syncope 09/26/2019   PCP:  Benita Stabile, MD Pharmacy:   [facility] [phone] - [place],  - [address] AT [facility] OF [address] [place] [address] Phone: [phone] Fax: [phone]     Social Determinants of Health (SDOH) Interventions    Readmission Risk Interventions Readmission Risk Prevention Plan 07/13/2020  Medication Screening Complete  Transportation Screening Complete

## 2020-07-13 NOTE — Progress Notes (Signed)
Called to get new IV on patient. Current 20G right AC is occluded. Patient has very fragile veins and not tolerating new IV attempt. RN took dressing off of current IV to find it occluded at insertion site. Able to aspirate blood and flush easily after occlusion cleared. Able to redress IV with sterile dressing and sterile guaze under dressing to support IV and prevent pulling/occluding at insertion site. Primary nurse notified.

## 2020-07-13 NOTE — Progress Notes (Signed)
Inpatient Diabetes Program Recommendations  AACE/ADA: New Consensus Statement on Inpatient Glycemic Control   Target Ranges:  Prepandial:   less than 140 mg/dL      Peak postprandial:   less than 180 mg/dL (1-2 hours)      Critically ill patients:  140 - 180 mg/dL  Results for Misty Price, Misty Price (MRN [id number]) as of 07/13/2020 06:54  Ref. Range 07/13/2020 04:25  Glucose Latest Ref Range: 70 - 99 mg/dL 188 (H)   Results for Misty Price, Misty Price (MRN [id number]) as of 07/13/2020 06:54  Ref. Range 07/12/2020 06:01 07/12/2020 07:52 07/12/2020 11:08 07/12/2020 15:58 07/12/2020 21:32  Glucose-Capillary Latest Ref Range: 70 - 99 mg/dL 601 (H) 093 (H) 235 (H) 158 (H) 281 (H)   Review of Glycemic Control  Diabetes history: DM2 Outpatient Diabetes medications: Lantus 11 units QHS, Tradjenta 5 mg daily Current orders for Inpatient glycemic control: Lantus 7 units daily, Novolog 0-15 units TID & HS  Inpatient Diabetes Program Recommendations:    Insulin: Please consider increasing Lantus to 11 units daily.  Thanks, Orlando Penner, RN, MSN, CDE Diabetes Coordinator Inpatient Diabetes Program [phone] (Team Pager from 8am to 5pm)

## 2020-07-13 NOTE — Progress Notes (Signed)
Pt shaky, pale, diaphoretic, Vitals and CBG obtained. [place] MD made aware will No new orders at this time  continue to monitor.

## 2020-07-13 NOTE — Transfer of Care (Signed)
Immediate Anesthesia Transfer of Care Note  Patient: Misty Price  Procedure(s) Performed: INTRAMEDULLARY (IM) NAIL INTERTROCHANTRIC (Left Hip)  Patient Location: PACU  Anesthesia Type:General  Level of Consciousness: awake  Airway & Oxygen Therapy: Patient Spontanous Breathing and Patient connected to nasal cannula oxygen  Post-op Assessment: Report given to RN and Post -op Vital signs reviewed and stable  Post vital signs: Reviewed and stable  Last Vitals:  Vitals Value Taken Time  BP    Temp    Pulse    Resp    SpO2      Last Pain:  Vitals:   07/13/20 0751  TempSrc: Oral  PainSc: Asleep         Complications: No complications documented.

## 2020-07-13 NOTE — Progress Notes (Signed)
ORTHOPAEDIC PROGRESS NOTE  Scheduled for Procedure(s): INTRAMEDULLARY (IM) NAIL INTERTROCHANTRIC  DOS: 07/13/2020  SUBJECTIVE: No issues over night. NPO since midnight.  Pain is controlled.  Daughter at the bedside  OBJECTIVE:  PE:  Confused.  Answers some questions appropriately.  Left hip is shortened, held in external rotation Sensation to left foot is intact 2+ DP pulse Active motion TA/EHL  Vitals:   07/13/20 0751 07/13/20 0754  BP: (!) 147/71   Pulse: (!) 105   Resp: 16   Temp: 98.3 F (36.8 C)   SpO2: 92% 95%     ASSESSMENT: Misty Price is a 86 y.o. female stable, ready for surgery.  NPO since midnight.  PLAN: Weightbearing: NWB LLE Insicional and dressing care: Reinforce dressings as needed; none currently Orthopedic device(s): None VTE prophylaxis: None currently, please hold until POD#1 Pain control: PRN medications, judicious use of narcotics Follow - up plan: 2 weeks postop   Contact information:     Saranya Harlin A. Dallas Schimke, MD MS [facility] [address] [place],  [place]  [address] Phone: [phone] Fax: [phone]

## 2020-07-13 NOTE — Progress Notes (Signed)
PROGRESS NOTE    Misty Price  WJX:[id number] DOB: 1936/01/01 DOA: 07/11/2020 PCP: Benita Stabile, MD   Chief Complaint  Patient presents with  . Hip Pain    Brief admission narrative:  Misty Price is a 85 y.o. female with medical history significant for dementia, T2DM, syncope, hypertension, vitamin B12 deficiency who presents to the emergency department due to fall sustained at home.  Patient was unable to provide history, possibly due to underlying dementia.  History was obtained from ED and ED medical record.  Per report, patient slipped and fell on the left hip, EMS was activated, pain medication was given by EMS team in route to the hospital.  ED Course:  In the emergency department, she was hemodynamically stable.  Work-up in the ED showed normocytic anemia, leukocytosis, hyperglycemia, elevated liver enzymes (AST 185, ALT 66).  SARS coronavirus 2 was negative. CT head without contrast and CT cervical spine without contrast showed no acute intracranial abnormality or any acute fracture or static subluxation of the cervical spine.  Assessment & Plan: 1-Closed comminuted intertrochanteric fracture of proximal end of left femur (HCC) -Continue as needed analgesics -Surgical repair anticipated later today with plan for intramedullary nail to further stabilize fracture -Will follow physical therapy/Occupational Therapy evaluation after surgery -Follow electrolytes, hemoglobin and clinical response.  2-transaminitis -In the setting of acute phase reactant, use of statins and dehydration -After fluid resuscitation LFTs significantly improved and close to normal. -Continue monitoring. -Continue holding statins for now.  3-acute kidney injury/dehydration -Resolved after fluid resuscitation -Continue minimizing nephrotoxic agents -Continue to follow renal function trend.  4-hypertension -Blood pressure stable currently -Continue monitoring vital signs without the use of  antihypertensive agents.  5-type 2 diabetes mellitus with chronic insulin therapy and hyperglycemia -Continue the use of a sliding scale insulin and Lantus -Follow CBGs and adjust hypoglycemic regimen as required. -Modified carbohydrate diet to be ordered after surgical procedure completed.  6-B12 deficiency -Continue monthly injections.  7-leukocytosis -In the setting of demargination -No acute infection appreciated -Continue to follow WBCs trend after hydration.  DVT prophylaxis: SCDs Code Status: Full code. Family Communication: No family at bedside. Disposition:   Status is: Inpatient  Dispo: The patient is from:               Anticipated d/c is to: To be determined              Anticipated d/c date is:1-2 days              Patient currently stable for discharge; status post left hip fracture and anticipated surgical repair later today.  We will follow recommendation by orthopedic service and evaluation post surgery by physical therapy.   Difficult to place patient No       Consultants:   Orthopedic service.   Procedures:  Anticipated intramedullary nail intertrochanteric left hip 07/13/20   Antimicrobials:  None   Subjective: No fever, no CP, no nausea or vomiting; complaining of left hip pain.  Objective: Vitals:   07/13/20 1215 07/13/20 1230 07/13/20 1251 07/13/20 1400  BP: 101/75 (!) 127/93 (!) 132/52 134/89  Pulse: 94 99 97 (!) 103  Resp: 15 (!) Temp:  98.3 F (36.8 C) 97.7 F (36.5 C) 97.7 F (36.5 C)  TempSrc:   Oral   SpO2: 94% 94% 100% 100%  Weight:      Height:        Intake/Output Summary (Last 24 hours) at 07/13/2020 1457 Last data filed  at 07/13/2020 1230 Gross per 24 hour  Intake 1190 ml  Output 1150 ml  Net 40 ml   Filed Weights   07/11/20 2150 07/12/20 0145 07/12/20 0235  Weight: 72 kg 69.9 kg 69.9 kg    Examination: General exam: Afebrile, no chest pain, no nausea, no vomiting.  Reporting left hip  pain. Respiratory system: Clear to auscultation. Respiratory effort normal.  No using accessory muscles. Cardiovascular system: S1 & S2 heard, no JVD, no rubs, no gallops. Gastrointestinal system: Abdomen is nondistended, soft and nontender. No organomegaly or masses felt. Normal bowel sounds heard. Central nervous system: No focal neurological deficits. Extremities: No cyanosis or clubbing.  Decreased range of motion of her left lower extremity secondary to pain. Skin: No petechiae. Psychiatry: Mood & affect appropriate.     Data Reviewed: I have personally reviewed following labs and imaging studies  CBC: Recent Labs  Lab 07/11/20 2313 07/12/20 0349  WBC 12.2* 9.4  NEUTROABS 9.1*  --   HGB 11.9* 11.5*  HCT 36.6 36.0  MCV 97.1 97.8  PLT 166 148*    Basic Metabolic Panel: Recent Labs  Lab 07/11/20 2313 07/12/20 0349 07/13/20 0425  NA 137 137 138  K 4.1 4.9 4.0  CL 103 101 103  CO2 27 25 25   GLUCOSE 278* 363* 238*  BUN 24* 27* 24*  CREATININE 1.05* 1.22* 0.98  CALCIUM 9.0 9.0 8.9  MG  --  1.9  --   PHOS  --  5.0*  --     GFR: Estimated Creatinine Clearance: 40.5 mL/min (by C-G formula based on SCr of 0.98 mg/dL).  Liver Function Tests: Recent Labs  Lab 07/11/20 2313 07/12/20 0349 07/13/20 0425  AST 185* 518* 81*  ALT 66* 238* 130*  ALKPHOS 114 153* 119  BILITOT 0.7 0.9 0.7  PROT 6.3* 6.0* 5.8*  ALBUMIN 3.5 3.5 3.2*    CBG: Recent Labs  Lab 07/12/20 1558 07/12/20 2132 07/13/20 0759 07/13/20 1130 07/13/20 1425  GLUCAP 158* 281* 255* 225* 239*     Recent Results (from the past 240 hour(s))  Resp Panel by RT-PCR (Flu A&B, Covid) Nasopharyngeal Swab     Status: None   Collection Time: 07/11/20 11:13 PM   Specimen: Nasopharyngeal Swab; Nasopharyngeal(NP) swabs in vial transport medium  Result Value Ref Range Status   SARS Coronavirus 2 by RT PCR NEGATIVE NEGATIVE Final    Comment: (NOTE) SARS-CoV-2 target nucleic acids are NOT DETECTED.  The  SARS-CoV-2 RNA is generally detectable in upper respiratory specimens during the acute phase of infection. The lowest concentration of SARS-CoV-2 viral copies this assay can detect is 138 copies/mL. A negative result does not preclude SARS-Cov-2 infection and should not be used as the sole basis for treatment or other patient management decisions. A negative result may occur with  improper specimen collection/handling, submission of specimen other than nasopharyngeal swab, presence of viral mutation(s) within the areas targeted by this assay, and inadequate number of viral copies(<138 copies/mL). A negative result must be combined with clinical observations, patient history, and epidemiological information. The expected result is Negative.  Fact Sheet for Patients:  BloggerCourse.comhttps://www.fda.gov/media/152166/download  Fact Sheet for Healthcare Providers:  SeriousBroker.ithttps://www.fda.gov/media/152162/download  This test is no t yet approved or cleared by the [place] FDA and  has been authorized for detection and/or diagnosis of SARS-CoV-2 by FDA under an Emergency Use Authorization (EUA). This EUA will remain  in effect (meaning this test can be used) for the duration of the COVID-19 declaration under  Section 564(b)(1) of the Act, 21 U.S.C.section 360bbb-3(b)(1), unless the authorization is terminated  or revoked sooner.       Influenza A by PCR NEGATIVE NEGATIVE Final   Influenza B by PCR NEGATIVE NEGATIVE Final    Comment: (NOTE) The Xpert Xpress SARS-CoV-2/FLU/RSV plus assay is intended as an aid in the diagnosis of influenza from Nasopharyngeal swab specimens and should not be used as a sole basis for treatment. Nasal washings and aspirates are unacceptable for Xpert Xpress SARS-CoV-2/FLU/RSV testing.  Fact Sheet for Patients: BloggerCourse.com  Fact Sheet for Healthcare Providers: SeriousBroker.it  This test is not yet approved or  cleared by the [place] FDA and has been authorized for detection and/or diagnosis of SARS-CoV-2 by FDA under an Emergency Use Authorization (EUA). This EUA will remain in effect (meaning this test can be used) for the duration of the COVID-19 declaration under Section 564(b)(1) of the Act, 21 U.S.C. section 360bbb-3(b)(1), unless the authorization is terminated or revoked.  Performed at [facility], [address], [place], [place] [address]   Surgical pcr screen     Status: None   Collection Time: 07/12/20 10:12 AM   Specimen: Nasal Mucosa; Nasal Swab  Result Value Ref Range Status   MRSA, PCR NEGATIVE NEGATIVE Final   Staphylococcus aureus NEGATIVE NEGATIVE Final    Comment: (NOTE) The Xpert SA Assay (FDA approved for NASAL specimens in patients 39 years of age and older), is one component of a comprehensive surveillance program. It is not intended to diagnose infection nor to guide or monitor treatment. Performed at [facility], [address] [place], [place] [address]      Radiology Studies: DG Chest 1 View  Result Date: 07/11/2020 CLINICAL DATA:  Hip pain EXAM: CHEST  1 VIEW COMPARISON:  09/26/2019 FINDINGS: Borderline to mild cardiomegaly. Both lungs are clear. Aortic atherosclerosis. The visualized skeletal structures are unremarkable. IMPRESSION: No active disease. Borderline to mild cardiomegaly. Electronically Signed   By: Jasmine Pang M.D.   On: 07/11/2020 22:52   CT Head Wo Contrast  Result Date: 07/11/2020 CLINICAL DATA:  Fall EXAM: CT HEAD WITHOUT CONTRAST CT CERVICAL SPINE WITHOUT CONTRAST TECHNIQUE: Multidetector CT imaging of the head and cervical spine was performed following the standard protocol without intravenous contrast. Multiplanar CT image reconstructions of the cervical spine were also generated. COMPARISON:  Head CT 09/26/2019 FINDINGS: CT HEAD FINDINGS Brain: There is no mass, hemorrhage or extra-axial collection. The size and configuration of the  ventricles and extra-axial CSF spaces are normal. The brain parenchyma is normal, without evidence of acute or chronic infarction. Vascular: No abnormal hyperdensity of the major intracranial arteries or dural venous sinuses. No intracranial atherosclerosis. Skull: The visualized skull base, calvarium and extracranial soft tissues are normal. Sinuses/Orbits: No fluid levels or advanced mucosal thickening of the visualized paranasal sinuses. No mastoid or middle ear effusion. The orbits are normal. CT CERVICAL SPINE FINDINGS Alignment: No static subluxation. Facets are aligned. Occipital condyles are normally positioned. Skull base and vertebrae: No acute fracture. Soft tissues and spinal canal: No prevertebral fluid or swelling. No visible canal hematoma. Disc levels: No advanced spinal canal or neural foraminal stenosis. Facets are fused on the left at C2-3 and on the right at C4-5. Upper chest: No pneumothorax, pulmonary nodule or pleural effusion. Other: Normal visualized paraspinal cervical soft tissues. IMPRESSION: 1. No acute intracranial abnormality. 2. No acute fracture or static subluxation of the cervical spine. Electronically Signed   By: Chrisandra Netters.D.  On: 07/11/2020 23:14   CT Cervical Spine Wo Contrast  Result Date: 07/11/2020 CLINICAL DATA:  Fall EXAM: CT HEAD WITHOUT CONTRAST CT CERVICAL SPINE WITHOUT CONTRAST TECHNIQUE: Multidetector CT imaging of the head and cervical spine was performed following the standard protocol without intravenous contrast. Multiplanar CT image reconstructions of the cervical spine were also generated. COMPARISON:  Head CT 09/26/2019 FINDINGS: CT HEAD FINDINGS Brain: There is no mass, hemorrhage or extra-axial collection. The size and configuration of the ventricles and extra-axial CSF spaces are normal. The brain parenchyma is normal, without evidence of acute or chronic infarction. Vascular: No abnormal hyperdensity of the major intracranial arteries or dural  venous sinuses. No intracranial atherosclerosis. Skull: The visualized skull base, calvarium and extracranial soft tissues are normal. Sinuses/Orbits: No fluid levels or advanced mucosal thickening of the visualized paranasal sinuses. No mastoid or middle ear effusion. The orbits are normal. CT CERVICAL SPINE FINDINGS Alignment: No static subluxation. Facets are aligned. Occipital condyles are normally positioned. Skull base and vertebrae: No acute fracture. Soft tissues and spinal canal: No prevertebral fluid or swelling. No visible canal hematoma. Disc levels: No advanced spinal canal or neural foraminal stenosis. Facets are fused on the left at C2-3 and on the right at C4-5. Upper chest: No pneumothorax, pulmonary nodule or pleural effusion. Other: Normal visualized paraspinal cervical soft tissues. IMPRESSION: 1. No acute intracranial abnormality. 2. No acute fracture or static subluxation of the cervical spine. Electronically Signed   By: Deatra Robinson M.D.   On: 07/11/2020 23:14   DG HIP OPERATIVE UNILAT W OR W/O PELVIS LEFT  Result Date: 07/13/2020 CLINICAL DATA:  85 year old female with a history of hip fracture, post fixation EXAM: OPERATIVE LEFT HIP (WITH PELVIS IF PERFORMED)  VIEWS TECHNIQUE: Fluoroscopic spot image(s) were submitted for interpretation post-operatively. COMPARISON:  None. FINDINGS: Multiple intraoperative fluoroscopic spot images. Images demonstrate left hip fracture with partial imaging of antegrade intramedullary rod placement and interlocking cannulated screw of the femoral neck. No complicating features. IMPRESSION: Intraoperative fluoroscopic spot images of left hip fracture fixation, as above, with no complicating features. Please refer to the dictated operative report for full details of intraoperative findings and procedure. Electronically Signed   By: Gilmer Mor D.O.   On: 07/13/2020 12:01   DG HIP UNILAT WITH PELVIS 2-3 VIEWS LEFT  Result Date: 07/13/2020 CLINICAL  DATA:  85 year old female postop EXAM: DG HIP (WITH OR WITHOUT PELVIS) 2-3V LEFT COMPARISON:  07/11/2020 FINDINGS: Interval surgical changes of antegrade intramedullary rod of the left femur with proximal cannulated femoral neck screw and mid shaft interlocking screw of the femur. Anatomic alignment relatively maintained. Gas within the soft tissues. No additional fracture identified. IMPRESSION: Early surgical changes of left hip fracture fixation with antegrade intramedullary rod, cannulated interlocking femoral neck screw, and mid shaft interlocking screw. Electronically Signed   By: Gilmer Mor D.O.   On: 07/13/2020 12:06   DG Hip Unilat With Pelvis 2-3 Views Left  Result Date: 07/11/2020 CLINICAL DATA:  Fall with left hip pain EXAM: DG HIP (WITH OR WITHOUT PELVIS) 2-3V LEFT COMPARISON:  09/26/2019 FINDINGS: Pubic symphysis is intact. Both femoral heads project in joint. Acute left intertrochanteric fracture with displaced lesser trochanteric fracture fragment. IMPRESSION: Acute mildly comminuted left intertrochanteric fracture Electronically Signed   By: Jasmine Pang M.D.   On: 07/11/2020 22:51   DG FEMUR MIN 2 VIEWS LEFT  Result Date: 07/12/2020 CLINICAL DATA:  LEFT hip fracture, operative planning EXAM: LEFT FEMUR 2 VIEWS COMPARISON:  LEFT hip  radiographs 07/11/2020 FINDINGS: Osseous demineralization. LEFT knee prosthesis identified. Displaced intertrochanteric fracture LEFT femur. No additional fracture, dislocation, or bone destruction. Visualized LEFT pelvis intact. IMPRESSION: Displaced intertrochanteric fracture LEFT femur. Osseous demineralization with LEFT knee prosthesis noted. Electronically Signed   By: Ulyses Southward M.D.   On: 07/12/2020 14:54   DG FEMUR PORT MIN 2 VIEWS LEFT  Result Date: 07/13/2020 CLINICAL DATA:  Postoperative evaluation EXAM: LEFT FEMUR PORTABLE 2 VIEWS COMPARISON:  LEFT hip of the same date. FINDINGS: Gas in the soft tissues from recent operative manipulation of  an inter trochanteric fracture with long intramedullary component extending towards the distal femur. Dynamic hip screw across the femoral neck with improved anatomic alignment and distal interlocking screw along the midportion of the intramedullary device in the proximal third of the femoral diaphysis. Lesser trochanter remains displaced. Signs of LEFT knee arthroplasty. IMPRESSION: ORIF of an intertrochanteric fracture, with improved anatomic alignment. Still with distraction of separate fragment of the lesser trochanter. LEFT knee arthroplasty. Electronically Signed   By: Donzetta Kohut M.D.   On: 07/13/2020 12:08    Scheduled Meds: . Chlorhexidine Gluconate Cloth  6 each Topical Daily  . feeding supplement  237 mL Oral BID BM  . insulin aspart  0-15 Units Subcutaneous TID AC & HS  . insulin glargine  7 Units Subcutaneous Daily   Continuous Infusions: .  ceFAZolin (ANCEF) IV       LOS: 1 day    Time spent: 35 minutes.   Vassie Loll, MD [facility]   To contact the attending provider between 7A-7P or the covering provider during after hours 7P-7A, please log into the web site [email or web address] and access using universal [place] password for that web site. If you do not have the password, please call the hospital operator.  07/13/2020, 2:57 PM

## 2020-07-14 ENCOUNTER — Encounter (HOSPITAL_COMMUNITY): Payer: Self-pay | Admitting: Orthopedic Surgery

## 2020-07-14 LAB — BASIC METABOLIC PANEL
Anion gap: 9 (ref 5–15)
BUN: 18 mg/dL (ref 8–23)
CO2: 27 mmol/L (ref 22–32)
Calcium: 8.6 mg/dL — ABNORMAL LOW (ref 8.9–10.3)
Chloride: 104 mmol/L (ref 98–111)
Creatinine, Ser: 0.91 mg/dL (ref 0.44–1.00)
GFR, Estimated: >60 mL/min (ref >60)
Glucose, Bld: 168 mg/dL — ABNORMAL HIGH (ref 70–99)
Potassium: 3.7 mmol/L (ref 3.5–5.1)
Sodium: 140 mmol/L (ref 135–145)

## 2020-07-14 LAB — GLUCOSE, CAPILLARY
Glucose-Capillary: 221 mg/dL — ABNORMAL HIGH (ref 70–99)
Glucose-Capillary: 274 mg/dL — ABNORMAL HIGH (ref 70–99)
Glucose-Capillary: 202 mg/dL — ABNORMAL HIGH (ref 70–99)

## 2020-07-14 LAB — CBC
HCT: 25 % — ABNORMAL LOW (ref 36.0–46.0)
Hemoglobin: 8.1 g/dL — ABNORMAL LOW (ref 12.0–15.0)
MCH: 31.6 pg (ref 26.0–34.0)
MCHC: 32.4 g/dL (ref 30.0–36.0)
MCV: 97.7 fL (ref 80.0–100.0)
Platelets: 120 10*3/uL — ABNORMAL LOW (ref 150–400)
RBC: 2.56 MIL/uL — ABNORMAL LOW (ref 3.87–5.11)
RDW: 13 % (ref 11.5–15.5)
WBC: 8.3 10*3/uL (ref 4.0–10.5)
nRBC: 0 % (ref 0.0–0.2)

## 2020-07-14 MED ORDER — MEMANTINE HCL 10 MG PO TABS
10.0000 mg | ORAL_TABLET | Freq: Two times a day (BID) | ORAL | Status: DC
  Administered 2020-07-14 – 2020-07-18 (×9): 10 mg via ORAL
  Filled 2020-07-14 (×9): qty 1

## 2020-07-14 MED ORDER — LEVETIRACETAM 500 MG PO TABS
500.0000 mg | ORAL_TABLET | Freq: Two times a day (BID) | ORAL | Status: DC
  Administered 2020-07-14 – 2020-07-18 (×9): 500 mg via ORAL
  Filled 2020-07-14 (×9): qty 1

## 2020-07-14 MED ORDER — SERTRALINE HCL 50 MG PO TABS
150.0000 mg | ORAL_TABLET | Freq: Every day | ORAL | Status: DC
  Administered 2020-07-14 – 2020-07-18 (×5): 150 mg via ORAL
  Filled 2020-07-14 (×5): qty 3

## 2020-07-14 MED ORDER — ASPIRIN EC 81 MG PO TBEC
81.0000 mg | DELAYED_RELEASE_TABLET | Freq: Two times a day (BID) | ORAL | Status: DC
  Administered 2020-07-14 – 2020-07-18 (×9): 81 mg via ORAL
  Filled 2020-07-14 (×9): qty 1

## 2020-07-14 MED ORDER — SERTRALINE HCL 50 MG PO TABS: 50.0000 mg | ORAL_TABLET | Freq: Every day | ORAL | Status: DC

## 2020-07-14 MED ORDER — SERTRALINE HCL 50 MG PO TABS: 100.0000 mg | ORAL_TABLET | Freq: Every day | ORAL | Status: DC

## 2020-07-14 NOTE — Care Management Important Message (Signed)
Important Message  Patient Details  Name: Misty Price MRN: [id number] Date of Birth: 01/22/1936   Medicare Important Message Given:  Yes     Corey Harold 07/14/2020, 1:26 PM

## 2020-07-14 NOTE — Progress Notes (Signed)
   ORTHOPAEDIC PROGRESS NOTE  s/p Procedure(s): INTRAMEDULLARY (IM) NAIL INTERTROCHANTRIC  DOS: 07/13/2020   SUBJECTIVE: Doing ok this morning.  Some confusion.  Issues with IV yesterday.  No issues over night.  No family at bedside this morning  OBJECTIVE: PE:  Working with PT.  Was able to get to chair at bedside with assistance.  Pale and tired.   Dressings are clean and dry.  Small amount of drainage from surgery. Active motion intact TA/EHL Sensation intact throughout the foot 2+ DP pulse.    Vitals:   07/14/20 0227 07/14/20 0644  BP: 104/74 (!) 114/46  Pulse: (!) 104 (!) 108  Resp: 18 18  Temp: 98.4 F (36.9 C) 99.5 F (37.5 C)  SpO2: 95% 97%   CBC    Component Value Date/Time   WBC 8.3 07/14/2020 0544   RBC 2.56 (L) 07/14/2020 0544   HGB 8.1 (L) 07/14/2020 0544   HCT 25.0 (L) 07/14/2020 0544   PLT 120 (L) 07/14/2020 0544   MCV 97.7 07/14/2020 0544   MCH 31.6 07/14/2020 0544   MCHC 32.4 07/14/2020 0544   RDW 13.0 07/14/2020 0544   LYMPHSABS 2.1 07/11/2020 2313   MONOABS 0.7 07/11/2020 2313   EOSABS 0.1 07/11/2020 2313   BASOSABS 0.1 07/11/2020 2313   XR left hip demonstrates IM nail in good position.  No acute fracture.  ASSESSMENT: Misty Price is a 85 y.o. female doing well postoperatively.  PLAN: Weightbearing: WBAT LLE Insicional and dressing care: Reinforce dressings as needed Orthopedic device(s): None VTE prophylaxis: discretion of medicine team; recommend aspirin 81 mg BID x 6 weeks  Pain control: PO pain medications, PRN Follow - up plan: 2 weeks for incision check and XR.  IF doing ok and at SNF, can reschedule to 6 weeks. Dressing can changed and sutures trimmed   Contact information:     Lucienne Sawyers A. Dallas Schimke, MD MS [facility] [address] [place],  [place]  [address] Phone: [phone] Fax: [phone]

## 2020-07-14 NOTE — Plan of Care (Signed)
  Problem: Acute Rehab OT Goals (only OT should resolve) Goal: Pt. Will Perform Grooming Flowsheets (Taken 07/14/2020 1021) Pt Will Perform Grooming:  with modified independence  sitting  standing Goal: Pt. Will Perform Upper Body Bathing Flowsheets (Taken 07/14/2020 1021) Pt Will Perform Upper Body Bathing:  with modified independence  sitting Goal: Pt. Will Perform Upper Body Dressing Flowsheets (Taken 07/14/2020 1021) Pt Will Perform Upper Body Dressing:  sitting  with adaptive equipment  with modified independence Goal: Pt. Will Perform Lower Body Dressing Flowsheets (Taken 07/14/2020 1021) Pt Will Perform Lower Body Dressing:  with adaptive equipment  sitting/lateral leans  bed level  with min assist Goal: Pt. Will Transfer To Toilet Flowsheets (Taken 07/14/2020 1021) Pt Will Transfer to Toilet:  with min assist  squat pivot transfer Goal: Pt/Caregiver Will Perform Home Exercise Program Flowsheets (Taken 07/14/2020 1021) Pt/caregiver will Perform Home Exercise Program:  Increased strength  Both right and left upper extremity  With Supervision  Independently

## 2020-07-14 NOTE — [place] FL2 (Signed)
  [place] MEDICAID FL2 LEVEL OF CARE SCREENING TOOL     IDENTIFICATION  Patient Name: Misty Price Birthdate: May 03, 1936 Sex: female Admission Date (Current Location): 07/11/2020  [facility] and [place] Number:  [employer] and Address:  [facility],  [address] [place] [address]      Provider Number: [id number]  Attending Physician Name and Address:  Vassie Loll, MD  Relative Name and Phone Number:       Current Level of Care: Hospital Recommended Level of Care: Skilled Nursing Facility Prior Approval Number:    Date Approved/Denied:   PASRR Number: [id number]  Discharge Plan: SNF    Current Diagnoses: Patient Active Problem List   Diagnosis Date Noted  . Closed comminuted intertrochanteric fracture of proximal end of left femur (HCC) 07/12/2020  . Leukocytosis 07/12/2020  . Hyperglycemia due to diabetes mellitus (HCC) 07/12/2020  . Transaminitis 07/12/2020  . Diabetes mellitus type 2, controlled, with complications (HCC) 09/27/2019  . Benign essential HTN 09/27/2019  . Fall 09/26/2019  . Diabetes mellitus (HCC) 09/26/2019  . Dementia (HCC) 09/26/2019  . Syncope 09/26/2019    Orientation RESPIRATION BLADDER Height & Weight     Self,Place    Continent Weight: 154 lb 1.6 oz (69.9 kg) Height:  5\' 2"  (157.5 cm)  BEHAVIORAL SYMPTOMS/MOOD NEUROLOGICAL BOWEL NUTRITION STATUS      Continent Diet (see dc summary)  AMBULATORY STATUS COMMUNICATION OF NEEDS Skin   Extensive Assist Verbally Surgical wounds                       Personal Care Assistance Level of Assistance  Bathing,Feeding,Dressing Bathing Assistance: Limited assistance Feeding assistance: Independent Dressing Assistance: Limited assistance     Functional Limitations Info  Sight,Hearing,Speech Sight Info: Adequate Hearing Info: Adequate Speech Info: Adequate    SPECIAL CARE FACTORS FREQUENCY  PT (By licensed PT),OT (By licensed OT)     PT Frequency: 5x  week OT Frequency: 3x week            Contractures Contractures Info: Not present    Additional Factors Info  Code Status,Allergies Code Status Info: Full Allergies Info: Keppra           Current Medications (07/14/2020):  This is the current hospital active medication list Current Facility-Administered Medications  Medication Dose Route Frequency Provider Last Rate Last Admin  . Chlorhexidine Gluconate Cloth 2 % PADS 6 each  6 each Topical Daily 07/16/2020, MD   6 each at 07/14/20 1023  . feeding supplement (ENSURE ENLIVE / ENSURE PLUS) liquid 237 mL  237 mL Oral BID BM 07/16/20 A, MD   237 mL at 07/14/20 1023  . haloperidol lactate (HALDOL) injection 0.5 mg  0.5 mg Intravenous Q8H PRN 07/16/20, MD      . insulin aspart (novoLOG) injection 0-15 Units  0-15 Units Subcutaneous TID AC & HS 07-26-1984, MD   5 Units at 07/14/20 1023  . insulin glargine (LANTUS) injection 11 Units  11 Units Subcutaneous Daily 07/16/20, MD   11 Units at 07/14/20 1027  . morphine 2 MG/ML injection 2 mg  2 mg Intravenous Q4H PRN 07/16/20, MD   2 mg at 07/14/20 07/16/20     Discharge Medications: Please see discharge summary for a list of discharge medications.  Relevant Imaging Results:  Relevant Lab Results:   Additional Information SSN: [id number]  2831, LCSW

## 2020-07-14 NOTE — Plan of Care (Signed)
  Problem: Acute Rehab PT Goals(only PT should resolve) Goal: Pt Will Go Supine/Side To Sit Outcome: Progressing Flowsheets (Taken 07/14/2020 1012) Pt will go Supine/Side to Sit: with moderate assist Goal: Patient Will Perform Sitting Balance Outcome: Progressing Flowsheets (Taken 07/14/2020 1012) Patient will perform sitting balance: with supervision Goal: Patient Will Transfer Sit To/From Stand Outcome: Progressing Flowsheets (Taken 07/14/2020 1012) Patient will transfer sit to/from stand:  with minimal assist  with moderate assist Goal: Pt Will Transfer Bed To Chair/Chair To Bed Outcome: Progressing Flowsheets (Taken 07/14/2020 1012) Pt will Transfer Bed to Chair/Chair to Bed: with mod assist Goal: Pt Will Ambulate Outcome: Progressing Flowsheets (Taken 07/14/2020 1012) Pt will Ambulate:  15 feet  with moderate assist  with rolling walker   10:13 AM, 07/14/20 Misty Price, MPT Physical Therapist with [facility] [phone] office [phone] mobile phone

## 2020-07-14 NOTE — TOC Progression Note (Signed)
Transition of Care [name]) - Progression Note    Patient Details  Name: Nolene Rocks MRN: [id number] Date of Birth: 25-Nov-1935  [facility] CM/SW Contact  Roda Shutters Margretta Sidle, RN Phone Number: 07/14/2020, 12:50 PM  The Appropriate Use Committee has met to discuss this patient's plan of care to review recommendations for post-acute treatment options.  The options were reviewed with the attending MD, TOC, rehab services and the physician advisor.  All available documentation has been reviewed and the determination has been made that this patient does meet criteria for placement in a [place] for short-term rehab. A consult to the Transitions of Care Team has been made to discuss alternative options and discharge planning with the patient/family.

## 2020-07-14 NOTE — Evaluation (Signed)
Occupational Therapy Evaluation Patient Details Name: Misty Price MRN: [id number] DOB: 1935/11/21 Today's Date: 07/14/2020    History of Present Illness Misty Price is a 85 y.o. female s/p Cephalomedullary nail for Left intertrochanteric femur fracture on 07/14/20 with medical history significant for dementia, T2DM, syncope, hypertension, vitamin B12 deficiency who presents to the emergency department due to fall sustained at home.  Patient was unable to provide history, possibly due to underlying dementia.  History was obtained from ED and ED medical record.  Per report, patient slipped and fell on the left hip, EMS was activated, pain medication was given by EMS team in route to the hospital.   Clinical Impression   Pt moderately agreeable to OT evaluation. Pt able to brush hair with S/U assist while sitting in the chair. Pt required maximal assist for sit to stand and back. Attempted with RW but pt demonstrated little activation of sit to stand. Task attempted again with this therapist anterior providing support. Pt limited by pain and notably grimaced and yelled out when attempting transfer. Pt will benefit from continued OT services to address strength and endurance to improve functional ADL status. Recommended for acute OT at least 2x a week for 2 weeks.     Follow Up Recommendations  SNF    Equipment Recommendations  None recommended by OT           Precautions / Restrictions Precautions Precautions: Fall Restrictions Weight Bearing Restrictions: Yes LLE Weight Bearing: Weight bearing as tolerated      Mobility Bed Mobility Overal bed mobility: Needs Assistance Bed Mobility: Supine to Sit     Supine to sit: Max assist     General bed mobility comments: increased time, labored movement, c/o severe pain left hip with movement    Transfers Overall transfer level: Needs assistance Equipment used: Rolling walker (2 wheeled) Transfers: Sit to/from Stand Sit to  Stand: Max assist Stand pivot transfers: Mod assist;Max assist       General transfer comment: Attempted with walker but little to no activaton of transfer from sit to stand. Task graded to this therapist anterior providing maximal assist.    Balance Overall balance assessment: Needs assistance Sitting-balance support: Feet supported;No upper extremity supported Sitting balance-Leahy Scale: Fair Sitting balance - Comments: seated at EOB with frequent leaning to the right due to left hip pain Postural control: Right lateral lean Standing balance support: During functional activity;Bilateral upper extremity supported Standing balance-Leahy Scale: Poor Standing balance comment: Moderate to maximal assist to static stand with this therapist anterior providing torso soport due to posterior lean.                           ADL either performed or assessed with clinical judgement   ADL Overall ADL's : Needs assistance/impaired     Grooming: Brushing hair;Set up;Sitting Grooming Details (indicate cue type and reason): Pt able to bursh hair using R UE with S/U assist.                                     Vision Baseline Vision/History: No visual deficits                  Pertinent Vitals/Pain Pain Assessment: Faces Faces Pain Scale: Hurts whole lot Pain Location: left hip with movement Pain Descriptors / Indicators: Grimacing;Guarding Pain Intervention(s): Limited activity within patient's tolerance  Hand Dominance Right   Extremity/Trunk Assessment Upper Extremity Assessment Upper Extremity Assessment: Generalized weakness   Lower Extremity Assessment Lower Extremity Assessment: Defer to PT evaluation RLE Deficits / Details: grossly 4/5 RLE Sensation: WNL RLE Coordination: WNL LLE Deficits / Details: grossly -3/5 LLE: Unable to fully assess due to pain LLE Sensation: WNL LLE Coordination: WNL   Cervical / Trunk Assessment Cervical /  Trunk Assessment: Normal   Communication Communication Communication: HOH   Cognition Arousal/Alertness: Awake/alert;Lethargic (Lethargic intermittently) Behavior During Therapy: WFL for tasks assessed/performed Overall Cognitive Status: History of cognitive impairments - at baseline                                                      Home Living Family/patient expects to be discharged to:: Private residence Living Arrangements: Children Available Help at Discharge: Family;Available 24 hours/day Type of Home: House Home Access: Stairs to enter [employer] of Steps: 2 Entrance Stairs-Rails: Right;Left;Can reach both Home Layout: One level     Bathroom Shower/Tub: Chief Strategy Officer: Standard Bathroom Accessibility: Yes   Home Equipment: Cane - single point;Walker - 2 wheels   Additional Comments: Pt poor historian. History taken from previous admission.      Prior Functioning/Environment Level of Independence: Needs assistance  Gait / Transfers Assistance Needed: household ambulator without AD, uses RW PRN ADL's / Homemaking Assistance Needed: assisted by family            OT Problem List: Decreased strength;Decreased range of motion;Decreased activity tolerance;Impaired balance (sitting and/or standing);Decreased coordination;Pain      OT Treatment/Interventions: Self-care/ADL training;Therapeutic exercise;DME and/or AE instruction;Therapeutic activities;Patient/family education;Balance training    OT Goals(Current goals can be found in the care plan section) Acute Rehab OT Goals Patient Stated Goal: return home OT Goal Formulation: Patient unable to participate in goal setting (cognitive impairments; history of dementia) Time For Goal Achievement: 07/28/20 Potential to Achieve Goals: Fair  OT Frequency: Min 2X/week                     End of Session Equipment Utilized During Treatment: Rolling walker;Gait  belt  Activity Tolerance: Patient limited by pain;Patient limited by lethargy Patient left: in chair;with chair alarm set;with call bell/phone within reach  OT Visit Diagnosis: Unsteadiness on feet (R26.81);Repeated falls (R29.6);Muscle weakness (generalized) (M62.81);History of falling (Z91.81);Pain Pain - Right/Left: Left Pain - part of body: Hip                Time: 9528-4132 OT Time Calculation (min): 26 min Charges:  OT General Charges $OT Visit: 1 Visit OT Evaluation $OT Eval Moderate Complexity: 1 Mod  Christiona Siddique OT, MOT   Danie Chandler 07/14/2020, 10:17 AM

## 2020-07-14 NOTE — Progress Notes (Signed)
PROGRESS NOTE    Misty Price  BOF:[id number] DOB: 09/09/1935 DOA: 07/11/2020 PCP: Benita Stabile, MD   Chief Complaint  Patient presents with  . Hip Pain    Brief admission narrative:  Misty Price is a 85 y.o. female with medical history significant for dementia, T2DM, syncope, hypertension, vitamin B12 deficiency who presents to the emergency department due to fall sustained at home.  Patient was unable to provide history, possibly due to underlying dementia.  History was obtained from ED and ED medical record.  Per report, patient slipped and fell on the left hip, EMS was activated, pain medication was given by EMS team in route to the hospital.  ED Course:  In the emergency department, she was hemodynamically stable.  Work-up in the ED showed normocytic anemia, leukocytosis, hyperglycemia, elevated liver enzymes (AST 185, ALT 66).  SARS coronavirus 2 was negative. CT head without contrast and CT cervical spine without contrast showed no acute intracranial abnormality or any acute fracture or static subluxation of the cervical spine.  Assessment & Plan: 1-Closed comminuted intertrochanteric fracture of proximal end of left femur (HCC) -Continue as needed analgesics -status post Intramedullar nail surgery on 07/13/20 -weight as tolerated (with left hip precaution) -aspirin 81mg  BID for DVT prophylaxis  -follow up with orthopedic service in 2 weeks -PT recommending SNF  2-transaminitis -In the setting of acute phase reactant, use of statins and dehydration -After fluid resuscitation LFTs significantly improved and close to normal. -Continue monitoring. -Continue holding statins for now.  3-acute kidney injury/dehydration -Resolved after fluid resuscitation -Continue minimizing nephrotoxic agents -Continue to follow renal function trend.  4-hypertension -Blood pressure stable currently -Continue monitoring vital signs without the use of antihypertensive agents.  5-type  2 diabetes mellitus with chronic insulin therapy and hyperglycemia -Continue the use of a sliding scale insulin and Lantus -Follow CBGs and further adjust hypoglycemic regimen as required. -continue modified carb diet -resume tradjenta at discharge.  6-B12 deficiency -Continue monthly injections.  7-leukocytosis -In the setting of demargination -No acute infection appreciated -Continue to follow WBCs trend. -received ancef prophylactically.    DVT prophylaxis: SCDs Code Status: Full code. Family Communication: daughter updated over the phone. Disposition:   Status is: Inpatient  Dispo: The patient is from: home              Anticipated d/c is to: SNF              Anticipated d/c date is:1-2 days              Patient currently stable for discharge; status post left hip fracture and surgical repair on 07/13/20.  PT recommending SNF   Difficult to place patient No       Consultants:   Orthopedic service.   Procedures:  Anticipated intramedullary nail intertrochanteric left hip 07/13/20   Antimicrobials:  None   Subjective: No CP, no fever, no nausea, no vomiting. Continue complaining of left hip pain. tolerated surgical repair. Requiring maximum assistance with transfers and ambulation.  Objective: Vitals:   07/13/20 1900 07/13/20 2202 07/14/20 0227 07/14/20 0644  BP: 126/66 (!) 107/56 104/74 (!) 114/46  Pulse: (!) 108 (!) 108 (!) 104 (!) 108  Resp: 18 18 18 18   Temp: 98.6 F (37 C) 98 F (36.7 C) 98.4 F (36.9 C) 99.5 F (37.5 C)  TempSrc:  Oral  Oral  SpO2: 100% 93% 95% 97%  Weight:      Height:        Intake/Output Summary (  Last 24 hours) at 07/14/2020 1122 Last data filed at 07/14/2020 1049 Gross per 24 hour  Intake 1949 ml  Output 1250 ml  Net 699 ml   Filed Weights   07/11/20 2150 07/12/20 0145 07/12/20 0235  Weight: 72 kg 69.9 kg 69.9 kg    Examination: General exam: Alert, awake, oriented x 1; no CP, no nausea, no vomiting and no SOB.  Complaining of left hip pain and requiring max assistance for transfers. Respiratory system: Clear to auscultation. Respiratory effort normal. No using accessory muscles. Cardiovascular system:Rate controlled, no rubs, no gallops. No JVD Gastrointestinal system: Abdomen is nondistended, soft and nontender. No organomegaly or masses felt. Normal bowel sounds heard. Central nervous system: No focal neurological deficits. Extremities: No cyanosis, no clubbing. Left hip dressings in place, cleand and dry. Skin: No rashes, lesions or ulcers Psychiatry: Judgement and insight appear impaired from underlying dementia, following commands appropriately and with normal mood and behavior currently normal.    Data Reviewed: I have personally reviewed following labs and imaging studies  CBC: Recent Labs  Lab 07/11/20 2313 07/12/20 0349 07/14/20 0544  WBC 12.2* 9.4 8.3  NEUTROABS 9.1*  --   --   HGB 11.9* 11.5* 8.1*  HCT 36.6 36.0 25.0*  MCV 97.1 97.8 97.7  PLT 166 148* 120*    Basic Metabolic Panel: Recent Labs  Lab 07/11/20 2313 07/12/20 0349 07/13/20 0425 07/14/20 0544  NA 137 137 138 140  K 4.1 4.9 4.0 3.7  CL 103 101 103 104  CO2 27 25 25 27   GLUCOSE 278* 363* 238* 168*  BUN 24* 27* 24* 18  CREATININE 1.05* 1.22* 0.98 0.91  CALCIUM 9.0 9.0 8.9 8.6*  MG  --  1.9  --   --   PHOS  --  5.0*  --   --     GFR: Estimated Creatinine Clearance: 43.6 mL/min (by C-G formula based on SCr of 0.91 mg/dL).  Liver Function Tests: Recent Labs  Lab 07/11/20 2313 07/12/20 0349 07/13/20 0425  AST 185* 518* 81*  ALT 66* 238* 130*  ALKPHOS 114 153* 119  BILITOT 0.7 0.9 0.7  PROT 6.3* 6.0* 5.8*  ALBUMIN 3.5 3.5 3.2*    CBG: Recent Labs  Lab 07/13/20 1130 07/13/20 1425 07/13/20 1627 07/13/20 2204 07/14/20 0817  GLUCAP 225* 239* 274* 254* 221*     Recent Results (from the past 240 hour(s))  Resp Panel by RT-PCR (Flu A&B, Covid) Nasopharyngeal Swab     Status: None    Collection Time: 07/11/20 11:13 PM   Specimen: Nasopharyngeal Swab; Nasopharyngeal(NP) swabs in vial transport medium  Result Value Ref Range Status   SARS Coronavirus 2 by RT PCR NEGATIVE NEGATIVE Final    Comment: (NOTE) SARS-CoV-2 target nucleic acids are NOT DETECTED.  The SARS-CoV-2 RNA is generally detectable in upper respiratory specimens during the acute phase of infection. The lowest concentration of SARS-CoV-2 viral copies this assay can detect is 138 copies/mL. A negative result does not preclude SARS-Cov-2 infection and should not be used as the sole basis for treatment or other patient management decisions. A negative result may occur with  improper specimen collection/handling, submission of specimen other than nasopharyngeal swab, presence of viral mutation(s) within the areas targeted by this assay, and inadequate number of viral copies(<138 copies/mL). A negative result must be combined with clinical observations, patient history, and epidemiological information. The expected result is Negative.  Fact Sheet for Patients:  07/13/20  Fact Sheet for Healthcare Providers:  BloggerCourse.com  This test is no t yet approved or cleared by the [place] FDA and  has been authorized for detection and/or diagnosis of SARS-CoV-2 by FDA under an Emergency Use Authorization (EUA). This EUA will remain  in effect (meaning this test can be used) for the duration of the COVID-19 declaration under Section 564(b)(1) of the Act, 21 U.S.C.section 360bbb-3(b)(1), unless the authorization is terminated  or revoked sooner.       Influenza A by PCR NEGATIVE NEGATIVE Final   Influenza B by PCR NEGATIVE NEGATIVE Final    Comment: (NOTE) The Xpert Xpress SARS-CoV-2/FLU/RSV plus assay is intended as an aid in the diagnosis of influenza from Nasopharyngeal swab specimens and should not be used as a sole basis for treatment.  Nasal washings and aspirates are unacceptable for Xpert Xpress SARS-CoV-2/FLU/RSV testing.  Fact Sheet for Patients: BloggerCourse.comhttps://www.fda.gov/media/152166/download  Fact Sheet for Healthcare Providers: SeriousBroker.ithttps://www.fda.gov/media/152162/download  This test is not yet approved or cleared by the [place] FDA and has been authorized for detection and/or diagnosis of SARS-CoV-2 by FDA under an Emergency Use Authorization (EUA). This EUA will remain in effect (meaning this test can be used) for the duration of the COVID-19 declaration under Section 564(b)(1) of the Act, 21 U.S.C. section 360bbb-3(b)(1), unless the authorization is terminated or revoked.  Performed at [facility], [address] [place], [place] [address]   Surgical pcr screen     Status: None   Collection Time: 07/12/20 10:12 AM   Specimen: Nasal Mucosa; Nasal Swab  Result Value Ref Range Status   MRSA, PCR NEGATIVE NEGATIVE Final   Staphylococcus aureus NEGATIVE NEGATIVE Final    Comment: (NOTE) The Xpert SA Assay (FDA approved for NASAL specimens in patients 83 years of age and older), is one component of a comprehensive surveillance program. It is not intended to diagnose infection nor to guide or monitor treatment. Performed at [facility], [address] [place], [place] [address]      Radiology Studies: DG HIP OPERATIVE UNILAT W OR W/O PELVIS LEFT  Result Date: 07/13/2020 CLINICAL DATA:  85 year old female with a history of hip fracture, post fixation EXAM: OPERATIVE LEFT HIP (WITH PELVIS IF PERFORMED)  VIEWS TECHNIQUE: Fluoroscopic spot image(s) were submitted for interpretation post-operatively. COMPARISON:  None. FINDINGS: Multiple intraoperative fluoroscopic spot images. Images demonstrate left hip fracture with partial imaging of antegrade intramedullary rod placement and interlocking cannulated screw of the femoral neck. No complicating features. IMPRESSION: Intraoperative fluoroscopic spot images of left  hip fracture fixation, as above, with no complicating features. Please refer to the dictated operative report for full details of intraoperative findings and procedure. Electronically Signed   By: Gilmer MorJaime  Wagner D.O.   On: 07/13/2020 12:01   DG HIP UNILAT WITH PELVIS 2-3 VIEWS LEFT  Result Date: 07/13/2020 CLINICAL DATA:  85 year old female postop EXAM: DG HIP (WITH OR WITHOUT PELVIS) 2-3V LEFT COMPARISON:  07/11/2020 FINDINGS: Interval surgical changes of antegrade intramedullary rod of the left femur with proximal cannulated femoral neck screw and mid shaft interlocking screw of the femur. Anatomic alignment relatively maintained. Gas within the soft tissues. No additional fracture identified. IMPRESSION: Early surgical changes of left hip fracture fixation with antegrade intramedullary rod, cannulated interlocking femoral neck screw, and mid shaft interlocking screw. Electronically Signed   By: Gilmer MorJaime  Wagner D.O.   On: 07/13/2020 12:06   DG FEMUR MIN 2 VIEWS LEFT  Result Date: 07/12/2020 CLINICAL DATA:  LEFT hip fracture, operative planning EXAM: LEFT FEMUR 2 VIEWS COMPARISON:  LEFT  hip radiographs 07/11/2020 FINDINGS: Osseous demineralization. LEFT knee prosthesis identified. Displaced intertrochanteric fracture LEFT femur. No additional fracture, dislocation, or bone destruction. Visualized LEFT pelvis intact. IMPRESSION: Displaced intertrochanteric fracture LEFT femur. Osseous demineralization with LEFT knee prosthesis noted. Electronically Signed   By: Ulyses Southward M.D.   On: 07/12/2020 14:54   DG FEMUR PORT MIN 2 VIEWS LEFT  Result Date: 07/13/2020 CLINICAL DATA:  Postoperative evaluation EXAM: LEFT FEMUR PORTABLE 2 VIEWS COMPARISON:  LEFT hip of the same date. FINDINGS: Gas in the soft tissues from recent operative manipulation of an inter trochanteric fracture with long intramedullary component extending towards the distal femur. Dynamic hip screw across the femoral neck with improved anatomic  alignment and distal interlocking screw along the midportion of the intramedullary device in the proximal third of the femoral diaphysis. Lesser trochanter remains displaced. Signs of LEFT knee arthroplasty. IMPRESSION: ORIF of an intertrochanteric fracture, with improved anatomic alignment. Still with distraction of separate fragment of the lesser trochanter. LEFT knee arthroplasty. Electronically Signed   By: Donzetta Kohut M.D.   On: 07/13/2020 12:08    Scheduled Meds: . aspirin EC  81 mg Oral BID  . Chlorhexidine Gluconate Cloth  6 each Topical Daily  . feeding supplement  237 mL Oral BID BM  . insulin aspart  0-15 Units Subcutaneous TID AC & HS  . insulin glargine  11 Units Subcutaneous Daily  . levETIRAcetam  500 mg Oral BID  . memantine  10 mg Oral BID  . sertraline  100 mg Oral Daily  . sertraline  50 mg Oral Daily   Continuous Infusions:    LOS: 2 days    Time spent: 35 minutes.   Vassie Loll, MD [facility]   To contact the attending provider between 7A-7P or the covering provider during after hours 7P-7A, please log into the web site [email or web address] and access using universal [place] password for that web site. If you do not have the password, please call the hospital operator.  07/14/2020, 11:22 AM

## 2020-07-14 NOTE — TOC Progression Note (Signed)
Transition of Care [facility]) - Progression Note    Patient Details  Name: Misty Price MRN: [id number] Date of Birth: 07-03-1935  Transition of [facility]) CM/SW Contact  Elliot Gault, LCSW Phone Number: 07/14/2020, 12:51 PM  Clinical Narrative:     TOC following. PT recommending SNF rehab. Spoke with pt's daughter and granddaughter today to discuss. They are agreeable and request referral to [facility] as pt's other granddaughter works there. Pt's daughter states pt has had two vaccine doses. Pt will likely be stable for dc in 1-2 days per MD.   Discussed with Eunice Blase at [facility] who states that they can accept pt and that she will NOT need a new covid test prior to dc.   Debbie to start insurance auth today. Weekend TOC will follow.  Expected Discharge Plan: Skilled Nursing Facility Barriers to Discharge: Continued Medical Work up  Expected Discharge Plan and Services Expected Discharge Plan: Skilled Nursing Facility In-house Referral: Clinical Social Work   Post Acute Care Choice: Skilled Nursing Facility Living arrangements for the past 2 months: Single Family Home                                       Social Determinants of Health (SDOH) Interventions    Readmission Risk Interventions Readmission Risk Prevention Plan 07/13/2020  Medication Screening Complete  Transportation Screening Complete

## 2020-07-14 NOTE — Evaluation (Signed)
Physical Therapy Evaluation Patient Details Name: Misty Price MRN: [id number] DOB: 06-10-1935 Today's Date: 07/14/2020   History of Present Illness  Misty Price is a 85 y.o. female s/p Cephalomedullary nail for Left intertrochanteric femur fracture on 07/14/20 with medical history significant for dementia, T2DM, syncope, hypertension, vitamin B12 deficiency who presents to the emergency department due to fall sustained at home.  Patient was unable to provide history, possibly due to underlying dementia.  History was obtained from ED and ED medical record.  Per report, patient slipped and fell on the left hip, EMS was activated, pain medication was given by EMS team in route to the hospital.    Clinical Impression  Patient given pain medication prior to functional activity and demonstrates slow labored movement for sitting up at bedside requiring Mod/max assist to move LLE due to increased pain, frequent leaning to the right while seated at bedside to left hip discomfort, at severe risk for falls and limited to a few shuffling side steps to transfer to chair.  Patient tolerated sitting up in chair after therapy - RN notified.  Patient will benefit from continued physical therapy in hospital and recommended venue below to increase strength, balance, endurance for safe ADLs and gait.     Follow Up Recommendations SNF    Equipment Recommendations  None recommended by PT    Recommendations for Other Services       Precautions / Restrictions Precautions Precautions: Fall Restrictions Weight Bearing Restrictions: Yes LLE Weight Bearing: Weight bearing as tolerated      Mobility  Bed Mobility Overal bed mobility: Needs Assistance Bed Mobility: Supine to Sit     Supine to sit: Max assist     General bed mobility comments: increased time, labored movement, c/o severe pain left hip with movement    Transfers Overall transfer level: Needs assistance Equipment used: Rolling  walker (2 wheeled) Transfers: Sit to/from [employer] Sit to Stand: Mod assist;Max assist Stand pivot transfers: Mod assist;Max assist       General transfer comment: increased time, labored movement, increased pain left hip  Ambulation/Gait Ambulation/Gait assistance: Max assist Gait Distance (Feet): 4 Feet Assistive device: Rolling walker (2 wheeled) Gait Pattern/deviations: Decreased step length - right;Decreased step length - left;Decreased stance time - left;Decreased stride length;Antalgic;Shuffle Gait velocity: slow   General Gait Details: limited to 4-5 slow labored shuffling steps with c/o severe pain left hip with weighbearing  Stairs            Wheelchair Mobility    Modified Rankin (Stroke Patients Only)       Balance Overall balance assessment: Needs assistance Sitting-balance support: Feet supported;No upper extremity supported Sitting balance-Leahy Scale: Fair Sitting balance - Comments: seated at EOB with frequent leaning to the right due to left hip pain Postural control: Right lateral lean Standing balance support: During functional activity;Bilateral upper extremity supported Standing balance-Leahy Scale: Poor Standing balance comment: using RW                             Pertinent Vitals/Pain Pain Assessment: Faces Faces Pain Scale: Hurts whole lot Pain Location: left hip with movement Pain Descriptors / Indicators: Grimacing;Guarding Pain Intervention(s): Limited activity within patient's tolerance;Monitored during session;RN gave pain meds during session;Repositioned    Home Living Family/patient expects to be discharged to:: Private residence Living Arrangements: Children Available Help at Discharge: Family;Available 24 hours/day Type of Home: House Home Access: Stairs to enter Entrance Stairs-Rails:  Right;Left;Can reach both Entrance Stairs-Number of Steps: 2 Home Layout: One level Home Equipment: Cane -  single point;Walker - 2 wheels      Prior Function Level of Independence: Needs assistance   Gait / Transfers Assistance Needed: household ambulator without AD, uses RW PRN  ADL's / Homemaking Assistance Needed: assisted by family        Hand Dominance        Extremity/Trunk Assessment   Upper Extremity Assessment Upper Extremity Assessment: Defer to OT evaluation    Lower Extremity Assessment Lower Extremity Assessment: Generalized weakness;RLE deficits/detail;LLE deficits/detail RLE Deficits / Details: grossly 4/5 RLE Sensation: WNL RLE Coordination: WNL LLE Deficits / Details: grossly -3/5 LLE: Unable to fully assess due to pain LLE Sensation: WNL LLE Coordination: WNL    Cervical / Trunk Assessment Cervical / Trunk Assessment: Normal  Communication   Communication: HOH  Cognition Arousal/Alertness: Awake/alert Behavior During Therapy: WFL for tasks assessed/performed Overall Cognitive Status: History of cognitive impairments - at baseline                                        General Comments      Exercises     Assessment/Plan    PT Assessment Patient needs continued PT services  PT Problem List Decreased strength;Decreased activity tolerance;Decreased balance;Decreased mobility;Pain       PT Treatment Interventions DME instruction;Stair training;Gait training;Functional mobility training;Therapeutic activities;Therapeutic exercise;Patient/family education;Balance training    PT Goals (Current goals can be found in the Care Plan section)  Acute Rehab PT Goals Patient Stated Goal: return home PT Goal Formulation: With patient Time For Goal Achievement: 07/28/20 Potential to Achieve Goals: Good    Frequency Min 3X/week   Barriers to discharge        Co-evaluation               AM-PAC PT "6 Clicks" Mobility  Outcome Measure Help needed turning from your back to your side while in a flat bed without using bedrails?: A  Lot Help needed moving from lying on your back to sitting on the side of a flat bed without using bedrails?: A Lot Help needed moving to and from a bed to a chair (including a wheelchair)?: A Lot Help needed standing up from a chair using your arms (e.g., wheelchair or bedside chair)?: A Lot Help needed to walk in hospital room?: A Lot Help needed climbing 3-5 steps with a railing? : Total 6 Click Score: 11    End of Session   Activity Tolerance: Patient tolerated treatment well;Patient limited by fatigue Patient left: in chair;with call bell/phone within reach;with chair alarm set Nurse Communication: Mobility status PT Visit Diagnosis: Unsteadiness on feet (R26.81);Other abnormalities of gait and mobility (R26.89);Muscle weakness (generalized) (M62.81);Pain Pain - Right/Left: Left Pain - part of body: Hip    Time: 0755-0824 PT Time Calculation (min) (ACUTE ONLY): 29 min   Charges:   PT Evaluation $PT Eval Moderate Complexity: 1 Mod PT Treatments $Therapeutic Activity: 23-37 mins        10:11 AM, 07/14/20 Ocie Bob, MPT Physical Therapist with [facility] [phone] office [phone] mobile phone

## 2020-07-15 DIAGNOSIS — D62 Acute posthemorrhagic anemia: Secondary | ICD-10-CM

## 2020-07-15 LAB — GLUCOSE, CAPILLARY
Glucose-Capillary: 184 mg/dL — ABNORMAL HIGH (ref 70–99)
Glucose-Capillary: 209 mg/dL — ABNORMAL HIGH (ref 70–99)
Glucose-Capillary: 223 mg/dL — ABNORMAL HIGH (ref 70–99)
Glucose-Capillary: 237 mg/dL — ABNORMAL HIGH (ref 70–99)

## 2020-07-15 MED ORDER — POLYSACCHARIDE IRON COMPLEX 150 MG PO CAPS
150.0000 mg | ORAL_CAPSULE | Freq: Two times a day (BID) | ORAL | Status: DC
  Administered 2020-07-15 – 2020-07-18 (×6): 150 mg via ORAL
  Filled 2020-07-15 (×7): qty 1

## 2020-07-15 NOTE — Progress Notes (Deleted)
Dr. Robb Matar made aware that patient has no IV and patient is refusing all medications both PO and IM, or IV, CBGs, and liquid intake. Patient still has 1:1 sitter and is still under IVC orders.

## 2020-07-15 NOTE — TOC Progression Note (Signed)
Transition of Care [facility]) - Progression Note    Patient Details  Name: Misty Price MRN: [id number] Date of Birth: 05-May-1936  [facility]) CM/SW Contact  Barry Brunner, LCSW Phone Number: 07/15/2020, 12:25 PM  Clinical Narrative:    Eunice Blase with [facility] reported that they were not able to take patient until Monday due to a Covid outbreak in facility and no female rooms available. TOC to follow.     Expected Discharge Plan: Skilled Nursing Facility Barriers to Discharge: Continued Medical Work up  Expected Discharge Plan and Services Expected Discharge Plan: Skilled Nursing Facility In-house Referral: Clinical Social Work   Post Acute Care Choice: Skilled Nursing Facility Living arrangements for the past 2 months: Single Family Home                                       Social Determinants of Health (SDOH) Interventions    Readmission Risk Interventions Readmission Risk Prevention Plan 07/13/2020  Medication Screening Complete  Transportation Screening Complete

## 2020-07-15 NOTE — Progress Notes (Signed)
PROGRESS NOTE    Misty Price  YSA:[id number] DOB: 07-05-1935 DOA: 07/11/2020 PCP: Benita Stabile, MD   Chief Complaint  Patient presents with  . Hip Pain    Brief admission narrative:  Misty Price is a 85 y.o. female with medical history significant for dementia, T2DM, syncope, hypertension, vitamin B12 deficiency who presents to the emergency department due to fall sustained at home.  Patient was unable to provide history, possibly due to underlying dementia.  History was obtained from ED and ED medical record.  Per report, patient slipped and fell on the left hip, EMS was activated, pain medication was given by EMS team in route to the hospital.  ED Course:  In the emergency department, she was hemodynamically stable.  Work-up in the ED showed normocytic anemia, leukocytosis, hyperglycemia, elevated liver enzymes (AST 185, ALT 66).  SARS coronavirus 2 was negative. CT head without contrast and CT cervical spine without contrast showed no acute intracranial abnormality or any acute fracture or static subluxation of the cervical spine.  Assessment & Plan: 1-Closed comminuted intertrochanteric fracture of proximal end of left femur (HCC) -Continue as needed analgesics -status post Intramedullar nail surgery on 07/13/20 -weight as tolerated (with left hip precaution) -aspirin 81mg  BID for DVT prophylaxis  -follow up with orthopedic service in 2 weeks -PT recommending SNF  2-transaminitis -In the setting of acute phase reactant, use of statins and dehydration -After fluid resuscitation LFTs significantly improved and close to normal. -Continue monitoring. -Continue holding statins for now.  3-acute kidney injury/dehydration -Resolved after fluid resuscitation -Continue minimizing nephrotoxic agents -Continue to follow renal function trend.  4-hypertension -Blood pressure stable currently -Continue monitoring vital signs without the use of antihypertensive agents.  5-type  2 diabetes mellitus with chronic insulin therapy and hyperglycemia -Continue the use of a sliding scale insulin and Lantus -Follow CBGs and further adjust hypoglycemic regimen as required. -continue modified carb diet -resume tradjenta at discharge.  6-B12 deficiency -Continue monthly injections.  7-leukocytosis -In the setting of demargination -No acute infection appreciated -Continue to follow WBCs trend. -received ancef prophylactically.   8-post surgery acute on chronic blood loss anemia -No requiring blood transfusion: -Continue to follow hemoglobin trend -Niferex twice a day has been started.  DVT prophylaxis: SCDs Code Status: Full code. Family Communication: daughter updated over the phone. Disposition:   Status is: Inpatient  Dispo: The patient is from: home              Anticipated d/c is to: SNF              Anticipated d/c date is:1-2 days              Patient currently remains stable for discharge; status post left hip fracture and surgical repair on 07/13/20.  PT recommending SNF.  Apparently accepting facility dealing with Covid outbreak and unable to take patient until 07/17/20.   Difficult to place patient No       Consultants:   Orthopedic service.   Procedures:  Anticipated intramedullary nail intertrochanteric left hip 07/13/20   Antimicrobials:  None   Subjective: No chest pain, no fever, no nausea, no vomiting.  Reports left hip pain with movement.  Objective: Vitals:   07/14/20 1407 07/14/20 2218 07/15/20 0650 07/15/20 1400  BP: 115/64 (!) 122/53 (!) 145/68 (!) 128/51  Pulse: 99 (!) 106 (!) 103 99  Resp: 18 16  17   Temp: 99 F (37.2 C) 99 F (37.2 C) 98.5 F (36.9 C) 98.2 F (  36.8 C)  TempSrc: Oral Oral Oral Oral  SpO2: 97% 91% 93% 92%  Weight:      Height:        Intake/Output Summary (Last 24 hours) at 07/15/2020 1457 Last data filed at 07/14/2020 1810 Gross per 24 hour  Intake 240 ml  Output --  Net 240 ml   Filed  Weights   07/11/20 2150 07/12/20 0145 07/12/20 0235  Weight: 72 kg 69.9 kg 69.9 kg    Examination: General exam: Alert, awake, oriented x 1; no chest pain, no nausea, no vomiting, no palpitations or shortness of breath.  Patient reports still having some pain with movement in her left hip.  Denies any other complaints. Respiratory system: Clear to auscultation. Respiratory effort normal.  No wheezing, no crackles, no using accessory muscle. Cardiovascular system:RRR. No murmurs, rubs, gallops.  No JVD. Gastrointestinal system: Abdomen is nondistended, soft and nontender. No organomegaly or masses felt. Normal bowel sounds heard. Central nervous system: Alert and oriented. No focal neurological deficits. Extremities: No cyanosis or clubbing; left hip wound dressings clean and intact. Skin: No petechiae. Psychiatry: Mood & affect appropriate.    Data Reviewed: I have personally reviewed following labs and imaging studies  CBC: Recent Labs  Lab 07/11/20 2313 07/12/20 0349 07/14/20 0544  WBC 12.2* 9.4 8.3  NEUTROABS 9.1*  --   --   HGB 11.9* 11.5* 8.1*  HCT 36.6 36.0 25.0*  MCV 97.1 97.8 97.7  PLT 166 148* 120*    Basic Metabolic Panel: Recent Labs  Lab 07/11/20 2313 07/12/20 0349 07/13/20 0425 07/14/20 0544  NA 137 137 138 140  K 4.1 4.9 4.0 3.7  CL 103 101 103 104  CO2 27 25 25 27   GLUCOSE 278* 363* 238* 168*  BUN 24* 27* 24* 18  CREATININE 1.05* 1.22* 0.98 0.91  CALCIUM 9.0 9.0 8.9 8.6*  MG  --  1.9  --   --   PHOS  --  5.0*  --   --     GFR: Estimated Creatinine Clearance: 43.6 mL/min (by C-G formula based on SCr of 0.91 mg/dL).  Liver Function Tests: Recent Labs  Lab 07/11/20 2313 07/12/20 0349 07/13/20 0425  AST 185* 518* 81*  ALT 66* 238* 130*  ALKPHOS 114 153* 119  BILITOT 0.7 0.9 0.7  PROT 6.3* 6.0* 5.8*  ALBUMIN 3.5 3.5 3.2*    CBG: Recent Labs  Lab 07/14/20 0817 07/14/20 1235 07/14/20 1633 07/15/20 0746 07/15/20 1125  GLUCAP 221* 274*  202* 184* 209*     Recent Results (from the past 240 hour(s))  Resp Panel by RT-PCR (Flu A&B, Covid) Nasopharyngeal Swab     Status: None   Collection Time: 07/11/20 11:13 PM   Specimen: Nasopharyngeal Swab; Nasopharyngeal(NP) swabs in vial transport medium  Result Value Ref Range Status   SARS Coronavirus 2 by RT PCR NEGATIVE NEGATIVE Final    Comment: (NOTE) SARS-CoV-2 target nucleic acids are NOT DETECTED.  The SARS-CoV-2 RNA is generally detectable in upper respiratory specimens during the acute phase of infection. The lowest concentration of SARS-CoV-2 viral copies this assay can detect is 138 copies/mL. A negative result does not preclude SARS-Cov-2 infection and should not be used as the sole basis for treatment or other patient management decisions. A negative result may occur with  improper specimen collection/handling, submission of specimen other than nasopharyngeal swab, presence of viral mutation(s) within the areas targeted by this assay, and inadequate number of viral copies(<138 copies/mL). A negative result must  be combined with clinical observations, patient history, and epidemiological information. The expected result is Negative.  Fact Sheet for Patients:  BloggerCourse.com  Fact Sheet for Healthcare Providers:  SeriousBroker.it  This test is no t yet approved or cleared by the [place] FDA and  has been authorized for detection and/or diagnosis of SARS-CoV-2 by FDA under an Emergency Use Authorization (EUA). This EUA will remain  in effect (meaning this test can be used) for the duration of the COVID-19 declaration under Section 564(b)(1) of the Act, 21 U.S.C.section 360bbb-3(b)(1), unless the authorization is terminated  or revoked sooner.       Influenza A by PCR NEGATIVE NEGATIVE Final   Influenza B by PCR NEGATIVE NEGATIVE Final    Comment: (NOTE) The Xpert Xpress SARS-CoV-2/FLU/RSV plus assay  is intended as an aid in the diagnosis of influenza from Nasopharyngeal swab specimens and should not be used as a sole basis for treatment. Nasal washings and aspirates are unacceptable for Xpert Xpress SARS-CoV-2/FLU/RSV testing.  Fact Sheet for Patients: BloggerCourse.com  Fact Sheet for Healthcare Providers: SeriousBroker.it  This test is not yet approved or cleared by the [place] FDA and has been authorized for detection and/or diagnosis of SARS-CoV-2 by FDA under an Emergency Use Authorization (EUA). This EUA will remain in effect (meaning this test can be used) for the duration of the COVID-19 declaration under Section 564(b)(1) of the Act, 21 U.S.C. section 360bbb-3(b)(1), unless the authorization is terminated or revoked.  Performed at [facility], [address] [place], [place] [address]   Surgical pcr screen     Status: None   Collection Time: 07/12/20 10:12 AM   Specimen: Nasal Mucosa; Nasal Swab  Result Value Ref Range Status   MRSA, PCR NEGATIVE NEGATIVE Final   Staphylococcus aureus NEGATIVE NEGATIVE Final    Comment: (NOTE) The Xpert SA Assay (FDA approved for NASAL specimens in patients 38 years of age and older), is one component of a comprehensive surveillance program. It is not intended to diagnose infection nor to guide or monitor treatment. Performed at [facility], [address], [place], [place] [address]      Radiology Studies: No results found.  Scheduled Meds: . aspirin EC  81 mg Oral BID  . Chlorhexidine Gluconate Cloth  6 each Topical Daily  . feeding supplement  237 mL Oral BID BM  . insulin aspart  0-15 Units Subcutaneous TID AC & HS  . insulin glargine  11 Units Subcutaneous Daily  . levETIRAcetam  500 mg Oral BID  . memantine  10 mg Oral BID  . sertraline  150 mg Oral Daily   Continuous Infusions:    LOS: 3 days    Time spent: 35 minutes.   Vassie Loll, MD [facility]   To contact the attending provider between 7A-7P or the covering provider during after hours 7P-7A, please log into the web site [email or web address] and access using universal  password for that web site. If you do not have the password, please call the hospital operator.  07/15/2020, 2:57 PM

## 2020-07-15 NOTE — Progress Notes (Signed)
   ORTHOPAEDIC PROGRESS NOTE  s/p Procedure(s): INTRAMEDULLARY (IM) NAIL INTERTROCHANTRIC  DOS: 07/13/2020   SUBJECTIVE: Resting comfortably.  No issues over night.  Patient confused, but does try to respond to questions.   OBJECTIVE: PE:  Dressings are clean and dry.  Small amount of drainage from surgery. Active motion intact TA/EHL Responds to light touch on left foot 2+ DP pulse.    Vitals:   07/14/20 2218 07/15/20 0650  BP: (!) 122/53 (!) 145/68  Pulse: (!) 106 (!) 103  Resp: 16   Temp: 99 F (37.2 C) 98.5 F (36.9 C)  SpO2: 91% 93%     XR left hip demonstrates IM nail in good position.  No acute fracture.  ASSESSMENT: Misty Price is a 85 y.o. female doing well postoperatively, POD#2  PLAN: Weightbearing: WBAT LLE Insicional and dressing care: Reinforce dressings as needed Orthopedic device(s): None VTE prophylaxis: discretion of medicine team; recommend aspirin 81 mg BID x 6 weeks  Pain control: PO pain medications, PRN Follow - up plan: 2 weeks for incision check and XR.  IF doing ok and at SNF, can reschedule to 6 weeks. Dressing can be changed and sutures trimmed   Contact information:     Cynithia Hakimi A. Dallas Schimke, MD MS [facility] [address] [place],  [place]  [address] Phone: [phone] Fax: [phone]

## 2020-07-16 LAB — GLUCOSE, CAPILLARY
Glucose-Capillary: 167 mg/dL — ABNORMAL HIGH (ref 70–99)
Glucose-Capillary: 175 mg/dL — ABNORMAL HIGH (ref 70–99)
Glucose-Capillary: 133 mg/dL — ABNORMAL HIGH (ref 70–99)
Glucose-Capillary: 167 mg/dL — ABNORMAL HIGH (ref 70–99)

## 2020-07-16 LAB — CBC
HCT: 23 % — ABNORMAL LOW (ref 36.0–46.0)
Hemoglobin: 7.2 g/dL — ABNORMAL LOW (ref 12.0–15.0)
MCH: 30.6 pg (ref 26.0–34.0)
MCHC: 31.3 g/dL (ref 30.0–36.0)
MCV: 97.9 fL (ref 80.0–100.0)
Platelets: 142 10*3/uL — ABNORMAL LOW (ref 150–400)
RBC: 2.35 MIL/uL — ABNORMAL LOW (ref 3.87–5.11)
RDW: 13.2 % (ref 11.5–15.5)
WBC: 7.2 10*3/uL (ref 4.0–10.5)
nRBC: 0 % (ref 0.0–0.2)

## 2020-07-16 LAB — ABO/RH: ABO/RH(D): A POS

## 2020-07-16 LAB — PREPARE RBC (CROSSMATCH)

## 2020-07-16 MED ORDER — ACETAMINOPHEN 325 MG PO TABS
650.0000 mg | ORAL_TABLET | Freq: Four times a day (QID) | ORAL | Status: DC | PRN
  Administered 2020-07-18: 650 mg via ORAL
  Filled 2020-07-16: qty 2

## 2020-07-16 MED ORDER — ENSURE ENLIVE PO LIQD
237.0000 mL | Freq: Three times a day (TID) | ORAL | Status: DC
  Administered 2020-07-16 – 2020-07-17 (×2): 237 mL via ORAL

## 2020-07-16 MED ORDER — ACETAMINOPHEN 500 MG PO TABS
1000.0000 mg | ORAL_TABLET | Freq: Once | ORAL | Status: AC
  Administered 2020-07-16: 1000 mg via ORAL
  Filled 2020-07-16: qty 2

## 2020-07-16 MED ORDER — HALOPERIDOL LACTATE 5 MG/ML IJ SOLN: 0.5000 mg | Freq: Two times a day (BID) | INTRAMUSCULAR | Status: DC | PRN

## 2020-07-16 MED ORDER — SODIUM CHLORIDE 0.9% IV SOLUTION: Freq: Once | INTRAVENOUS | Status: DC

## 2020-07-16 NOTE — Progress Notes (Signed)
PROGRESS NOTE    Misty Price  TIR:[id number] DOB: 1936/04/30 DOA: 07/11/2020 PCP: Benita Stabile, MD   Chief Complaint  Patient presents with  . Hip Pain    Brief admission narrative:  Misty Price is a 85 y.o. female with medical history significant for dementia, T2DM, syncope, hypertension, vitamin B12 deficiency who presents to the emergency department due to fall sustained at home.  Patient was unable to provide history, possibly due to underlying dementia.  History was obtained from ED and ED medical record.  Per report, patient slipped and fell on the left hip, EMS was activated, pain medication was given by EMS team in route to the hospital.  ED Course:  In the emergency department, she was hemodynamically stable.  Work-up in the ED showed normocytic anemia, leukocytosis, hyperglycemia, elevated liver enzymes (AST 185, ALT 66).  SARS coronavirus 2 was negative. CT head without contrast and CT cervical spine without contrast showed no acute intracranial abnormality or any acute fracture or static subluxation of the cervical spine.  Assessment & Plan: 1-Closed comminuted intertrochanteric fracture of proximal end of left femur (HCC) -Continue as needed analgesics -status post Intramedullar nail surgery on 07/13/20 -weight as tolerated (with left hip precaution) -aspirin 81mg  BID for DVT prophylaxis  -follow up with orthopedic service in 2 weeks -PT recommending SNF  2-transaminitis -In the setting of acute phase reactant, use of statins and dehydration -After fluid resuscitation LFTs significantly improved and close to normal. -Continue monitoring. -Continue holding statins for now.  3-acute kidney injury/dehydration -Resolved after fluid resuscitation -Continue minimizing nephrotoxic agents -Continue to follow renal function trend.  4-hypertension -Blood pressure stable currently -Continue monitoring vital signs without the use of antihypertensive agents.  5-type  2 diabetes mellitus with chronic insulin therapy and hyperglycemia -Continue the use of a sliding scale insulin and Lantus -Follow CBGs and further adjust hypoglycemic regimen as required. -continue modified carb diet -resume tradjenta at discharge.  6-B12 deficiency -Continue monthly injections.  7-leukocytosis -In the setting of demargination -No acute infection appreciated -Continue to follow WBCs trend. -received ancef prophylactically.   8-post operative acute on chronic blood loss anemia -Hemoglobin has dropped to 7 today; on admission hemoglobin around 11. -Continue Niferex twice a day -1 unit PRBCs will be transfused -Continue to follow hemoglobin trend.   DVT prophylaxis: SCDs Code Status: Full code. Family Communication: daughter updated over the phone. Disposition:   Status is: Inpatient  Dispo: The patient is from: home              Anticipated d/c is to: SNF              Anticipated d/c date is:1-2 days              Patient currently remains stable for discharge; status post left hip fracture and surgical repair on 07/13/20.  PT recommending SNF.  Apparently accepting facility dealing with Covid outbreak and unable to take patient until 07/17/20.   Difficult to place patient No    Consultants:   Orthopedic service.   Procedures:  Anticipated intramedullary nail intertrochanteric left hip 07/13/20   Antimicrobials:  None   Subjective: Feeling weak and tired; no chest pain, no nausea, no vomiting, no shortness of breath.  Continue complaining of intermittent left hip pain.  Pale on examination.  Objective: Vitals:   07/15/20 1531 07/15/20 2018 07/16/20 0508 07/16/20 0510  BP: 121/62 (!) 129/51 (!) 96/51 (!) 96/51  Pulse: 99 100  94  Resp: 20 16  16 16  Temp: 97.8 F (36.6 C) 98.1 F (36.7 C) (!) 97.3 F (36.3 C) (!) 97.3 F (36.3 C)  TempSrc: Oral   Temporal  SpO2: 96% 92%  92%  Weight:      Height:        Intake/Output Summary (Last 24 hours)  at 07/16/2020 1046 Last data filed at 07/16/2020 0900 Gross per 24 hour  Intake --  Output 650 ml  Net -650 ml   Filed Weights   07/11/20 2150 07/12/20 0145 07/12/20 0235  Weight: 72 kg 69.9 kg 69.9 kg    Examination: General exam: Alert, awake, oriented x 1; pale in appearance and with soft blood pressure.  No chest pain, no nausea, no vomiting, no fever. Respiratory system: Clear to auscultation. Respiratory effort normal.  No using accessory muscles. Cardiovascular system: Rate controlled, no rubs, no gallops, no murmurs appreciated on exam.  No JVD. Gastrointestinal system: Abdomen is nondistended, soft and nontender. No organomegaly or masses felt. Normal bowel sounds heard. Central nervous system: No focal neurological deficits. Extremities: No cyanosis or clubbing. Skin: No petechiae. Psychiatry: Mood & affect appropriate.    Data Reviewed: I have personally reviewed following labs and imaging studies  CBC: Recent Labs  Lab 07/11/20 2313 07/12/20 0349 07/14/20 0544 07/16/20 0526  WBC 12.2* 9.4 8.3 7.2  NEUTROABS 9.1*  --   --   --   HGB 11.9* 11.5* 8.1* 7.2*  HCT 36.6 36.0 25.0* 23.0*  MCV 97.1 97.8 97.7 97.9  PLT 166 148* 120* 142*    Basic Metabolic Panel: Recent Labs  Lab 07/11/20 2313 07/12/20 0349 07/13/20 0425 07/14/20 0544  NA 137 137 138 140  K 4.1 4.9 4.0 3.7  CL 103 101 103 104  CO2 27 25 25 27   GLUCOSE 278* 363* 238* 168*  BUN 24* 27* 24* 18  CREATININE 1.05* 1.22* 0.98 0.91  CALCIUM 9.0 9.0 8.9 8.6*  MG  --  1.9  --   --   PHOS  --  5.0*  --   --     GFR: Estimated Creatinine Clearance: 43.6 mL/min (by C-G formula based on SCr of 0.91 mg/dL).  Liver Function Tests: Recent Labs  Lab 07/11/20 2313 07/12/20 0349 07/13/20 0425  AST 185* 518* 81*  ALT 66* 238* 130*  ALKPHOS 114 153* 119  BILITOT 0.7 0.9 0.7  PROT 6.3* 6.0* 5.8*  ALBUMIN 3.5 3.5 3.2*    CBG: Recent Labs  Lab 07/15/20 0746 07/15/20 1125 07/15/20 1626  07/15/20 2019 07/16/20 0759  GLUCAP 184* 209* 223* 237* 167*     Recent Results (from the past 240 hour(s))  Resp Panel by RT-PCR (Flu A&B, Covid) Nasopharyngeal Swab     Status: None   Collection Time: 07/11/20 11:13 PM   Specimen: Nasopharyngeal Swab; Nasopharyngeal(NP) swabs in vial transport medium  Result Value Ref Range Status   SARS Coronavirus 2 by RT PCR NEGATIVE NEGATIVE Final    Comment: (NOTE) SARS-CoV-2 target nucleic acids are NOT DETECTED.  The SARS-CoV-2 RNA is generally detectable in upper respiratory specimens during the acute phase of infection. The lowest concentration of SARS-CoV-2 viral copies this assay can detect is 138 copies/mL. A negative result does not preclude SARS-Cov-2 infection and should not be used as the sole basis for treatment or other patient management decisions. A negative result may occur with  improper specimen collection/handling, submission of specimen other than nasopharyngeal swab, presence of viral mutation(s) within the areas targeted by this assay, and  inadequate number of viral copies(<138 copies/mL). A negative result must be combined with clinical observations, patient history, and epidemiological information. The expected result is Negative.  Fact Sheet for Patients:  BloggerCourse.com  Fact Sheet for Healthcare Providers:  SeriousBroker.it  This test is no t yet approved or cleared by the [place] FDA and  has been authorized for detection and/or diagnosis of SARS-CoV-2 by FDA under an Emergency Use Authorization (EUA). This EUA will remain  in effect (meaning this test can be used) for the duration of the COVID-19 declaration under Section 564(b)(1) of the Act, 21 U.S.C.section 360bbb-3(b)(1), unless the authorization is terminated  or revoked sooner.       Influenza A by PCR NEGATIVE NEGATIVE Final   Influenza B by PCR NEGATIVE NEGATIVE Final    Comment:  (NOTE) The Xpert Xpress SARS-CoV-2/FLU/RSV plus assay is intended as an aid in the diagnosis of influenza from Nasopharyngeal swab specimens and should not be used as a sole basis for treatment. Nasal washings and aspirates are unacceptable for Xpert Xpress SARS-CoV-2/FLU/RSV testing.  Fact Sheet for Patients: BloggerCourse.com  Fact Sheet for Healthcare Providers: SeriousBroker.it  This test is not yet approved or cleared by the [place] FDA and has been authorized for detection and/or diagnosis of SARS-CoV-2 by FDA under an Emergency Use Authorization (EUA). This EUA will remain in effect (meaning this test can be used) for the duration of the COVID-19 declaration under Section 564(b)(1) of the Act, 21 U.S.C. section 360bbb-3(b)(1), unless the authorization is terminated or revoked.  Performed at [facility], [address] [place], [place] [address]   Surgical pcr screen     Status: None   Collection Time: 07/12/20 10:12 AM   Specimen: Nasal Mucosa; Nasal Swab  Result Value Ref Range Status   MRSA, PCR NEGATIVE NEGATIVE Final   Staphylococcus aureus NEGATIVE NEGATIVE Final    Comment: (NOTE) The Xpert SA Assay (FDA approved for NASAL specimens in patients 39 years of age and older), is one component of a comprehensive surveillance program. It is not intended to diagnose infection nor to guide or monitor treatment. Performed at [facility], [address] [place], [place] [address]      Radiology Studies: No results found.  Scheduled Meds: . sodium chloride   Intravenous Once  . aspirin EC  81 mg Oral BID  . Chlorhexidine Gluconate Cloth  6 each Topical Daily  . feeding supplement  237 mL Oral TID BM  . insulin aspart  0-15 Units Subcutaneous TID AC & HS  . insulin glargine  11 Units Subcutaneous Daily  . iron polysaccharides  150 mg Oral BID  . levETIRAcetam  500 mg Oral BID  . memantine  10 mg Oral BID  .  sertraline  150 mg Oral Daily   Continuous Infusions:    LOS: 4 days    Time spent: 35 minutes.   Vassie Loll, MD [facility]   To contact the attending provider between 7A-7P or the covering provider during after hours 7P-7A, please log into the web site [email or web address] and access using universal [place] password for that web site. If you do not have the password, please call the hospital operator.  07/16/2020, 10:46 AM

## 2020-07-17 ENCOUNTER — Ambulatory Visit: Payer: Medicare HMO | Admitting: Cardiology

## 2020-07-17 LAB — TYPE AND SCREEN
ABO/RH(D): A POS
Antibody Screen: NEGATIVE
Unit division: 0

## 2020-07-17 LAB — GLUCOSE, CAPILLARY
Glucose-Capillary: 134 mg/dL — ABNORMAL HIGH (ref 70–99)
Glucose-Capillary: 152 mg/dL — ABNORMAL HIGH (ref 70–99)
Glucose-Capillary: 162 mg/dL — ABNORMAL HIGH (ref 70–99)
Glucose-Capillary: 201 mg/dL — ABNORMAL HIGH (ref 70–99)

## 2020-07-17 LAB — BPAM RBC
Blood Product Expiration Date: 202203132359
ISSUE DATE / TIME: 202202201601
Unit Type and Rh: 6200

## 2020-07-17 NOTE — Care Management Important Message (Signed)
Important Message  Patient Details  Name: Misty Price MRN: [id number] Date of Birth: 11/10/1935   Medicare Important Message Given:  Yes     Corey Harold 07/17/2020, 3:02 PM

## 2020-07-17 NOTE — Progress Notes (Addendum)
PROGRESS NOTE    Misty Price  OHY:[id number] DOB: 08-01-1935 DOA: 07/11/2020 PCP: Benita Stabile, MD   Chief Complaint  Patient presents with  . Hip Pain    Brief admission narrative:  Misty Price is a 85 y.o. female with medical history significant for dementia, T2DM, syncope, hypertension, vitamin B12 deficiency who presents to the emergency department due to fall sustained at home.  Patient was unable to provide history, possibly due to underlying dementia.  History was obtained from ED and ED medical record.  Per report, patient slipped and fell on the left hip, EMS was activated, pain medication was given by EMS team in route to the hospital.  ED Course:  In the emergency department, she was hemodynamically stable.  Work-up in the ED showed normocytic anemia, leukocytosis, hyperglycemia, elevated liver enzymes (AST 185, ALT 66).  SARS coronavirus 2 was negative. CT head without contrast and CT cervical spine without contrast showed no acute intracranial abnormality or any acute fracture or static subluxation of the cervical spine.  Assessment & Plan: 1-Closed comminuted intertrochanteric fracture of proximal end of left femur (HCC) -Continue as needed analgesics -status post Intramedullar nail surgery on 07/13/20 -weight as tolerated (with left hip precaution) -aspirin 81mg  BID for DVT prophylaxis  -follow up with orthopedic service in 2 weeks -PT recommending SNF  2-transaminitis -In the setting of acute phase reactant, use of statins and dehydration -After fluid resuscitation LFTs significantly improved and close to normal. -Continue monitoring. -Continue holding statins for now.  3-acute kidney injury/dehydration -Resolved after fluid resuscitation -Continue minimizing nephrotoxic agents -Continue to follow renal function trend.  4-hypertension -Blood pressure stable currently -Continue monitoring vital signs without the use of antihypertensive agents.  5-type  2 diabetes mellitus with chronic insulin therapy and hyperglycemia -Continue the use of a sliding scale insulin and Lantus -Follow CBGs and further adjust hypoglycemic regimen as required. -continue modified carb diet -resume tradjenta at discharge.  6-B12 deficiency -Continue monthly injections.  7-leukocytosis -In the setting of demargination -No acute infection appreciated -Continue to follow WBCs trend. -received ancef prophylactically.   8-post operative acute on chronic blood loss anemia -Hemoglobin has dropped to 7; on admission hemoglobin around 11. -Continue Niferex twice a day -Status post 1 unit PRBCs  -Continue to follow hemoglobin trend; repeat CBC in a.m..  -No overt bleeding appreciated.  DVT prophylaxis: SCDs Code Status: Full code. Family Communication: daughter updated over the phone. Disposition:   Status is: Inpatient  Dispo: The patient is from: home              Anticipated d/c is to: SNF              Anticipated d/c date is:1-2 days              Patient currently remains stable for discharge; status post left hip fracture and surgical repair on 07/13/20.  PT recommending SNF.  Apparently accepting facility dealing with Covid outbreak and unable to take patient.  Will wait on skilled nursing facility availability before patient can be discharged.   Difficult to place patient No    Consultants:   Orthopedic service.   Procedures:  Anticipated intramedullary nail intertrochanteric left hip 07/13/20   Antimicrobials:  None   Subjective: No chest pain, no nausea, no vomiting, no shortness of breath.  Reports some improvement in her left hip pain.  Weak, tired and deconditioned.  Objective: Vitals:   07/16/20 1930 07/16/20 2056 07/17/20 0520 07/17/20 1454  BP: 07/19/20)  146/65 (!) 148/63 (!) 143/67 (!) 139/58  Pulse: 94 95 84 85  Resp: 16 19 18 18   Temp: 98.5 F (36.9 C) 98.2 F (36.8 C) 98.3 F (36.8 C) 98.7 F (37.1 C)  TempSrc: Oral Oral  Oral   SpO2: 97% 94% 94% 95%  Weight:      Height:        Intake/Output Summary (Last 24 hours) at 07/17/2020 1729 Last data filed at 07/17/2020 1300 Gross per 24 hour  Intake 740 ml  Output 200 ml  Net 540 ml   Filed Weights   07/11/20 2150 07/12/20 0145 07/12/20 0235  Weight: 72 kg 69.9 kg 69.9 kg    Examination: General exam: Sleepy, but easily aroused; oriented x1.  No chest pain, no nausea, no vomiting.  No requiring oxygen supplementation. Respiratory system: Clear to auscultation. Respiratory effort normal.  No using accessory muscles. Cardiovascular system:RRR. No murmurs, rubs, gallops.  No JVD. Gastrointestinal system: Abdomen is nondistended, soft and nontender. No organomegaly or masses felt. Normal bowel sounds heard. Central nervous system: No focal neurological deficits. Extremities: No cyanosis or clubbing. Skin: No rashes, no petechiae. Psychiatry: Mood & affect appropriate.   Data Reviewed: I have personally reviewed following labs and imaging studies  CBC: Recent Labs  Lab 07/11/20 2313 07/12/20 0349 07/14/20 0544 07/16/20 0526  WBC 12.2* 9.4 8.3 7.2  NEUTROABS 9.1*  --   --   --   HGB 11.9* 11.5* 8.1* 7.2*  HCT 36.6 36.0 25.0* 23.0*  MCV 97.1 97.8 97.7 97.9  PLT 166 148* 120* 142*    Basic Metabolic Panel: Recent Labs  Lab 07/11/20 2313 07/12/20 0349 07/13/20 0425 07/14/20 0544  NA 137 137 138 140  K 4.1 4.9 4.0 3.7  CL 103 101 103 104  CO2 27 25 25 27   GLUCOSE 278* 363* 238* 168*  BUN 24* 27* 24* 18  CREATININE 1.05* 1.22* 0.98 0.91  CALCIUM 9.0 9.0 8.9 8.6*  MG  --  1.9  --   --   PHOS  --  5.0*  --   --     GFR: Estimated Creatinine Clearance: 43.6 mL/min (by C-G formula based on SCr of 0.91 mg/dL).  Liver Function Tests: Recent Labs  Lab 07/11/20 2313 07/12/20 0349 07/13/20 0425  AST 185* 518* 81*  ALT 66* 238* 130*  ALKPHOS 114 153* 119  BILITOT 0.7 0.9 0.7  PROT 6.3* 6.0* 5.8*  ALBUMIN 3.5 3.5 3.2*    CBG: Recent Labs   Lab 07/16/20 1706 07/16/20 2057 07/17/20 0802 07/17/20 1155 07/17/20 1616  GLUCAP 133* 167* 134* 152* 162*     Recent Results (from the past 240 hour(s))  Resp Panel by RT-PCR (Flu A&B, Covid) Nasopharyngeal Swab     Status: None   Collection Time: 07/11/20 11:13 PM   Specimen: Nasopharyngeal Swab; Nasopharyngeal(NP) swabs in vial transport medium  Result Value Ref Range Status   SARS Coronavirus 2 by RT PCR NEGATIVE NEGATIVE Final    Comment: (NOTE) SARS-CoV-2 target nucleic acids are NOT DETECTED.  The SARS-CoV-2 RNA is generally detectable in upper respiratory specimens during the acute phase of infection. The lowest concentration of SARS-CoV-2 viral copies this assay can detect is 138 copies/mL. A negative result does not preclude SARS-Cov-2 infection and should not be used as the sole basis for treatment or other patient management decisions. A negative result may occur with  improper specimen collection/handling, submission of specimen other than nasopharyngeal swab, presence of viral mutation(s) within the  areas targeted by this assay, and inadequate number of viral copies(<138 copies/mL). A negative result must be combined with clinical observations, patient history, and epidemiological information. The expected result is Negative.  Fact Sheet for Patients:  BloggerCourse.com  Fact Sheet for Healthcare Providers:  SeriousBroker.it  This test is no t yet approved or cleared by the [place] FDA and  has been authorized for detection and/or diagnosis of SARS-CoV-2 by FDA under an Emergency Use Authorization (EUA). This EUA will remain  in effect (meaning this test can be used) for the duration of the COVID-19 declaration under Section 564(b)(1) of the Act, 21 U.S.C.section 360bbb-3(b)(1), unless the authorization is terminated  or revoked sooner.       Influenza A by PCR NEGATIVE NEGATIVE Final   Influenza B  by PCR NEGATIVE NEGATIVE Final    Comment: (NOTE) The Xpert Xpress SARS-CoV-2/FLU/RSV plus assay is intended as an aid in the diagnosis of influenza from Nasopharyngeal swab specimens and should not be used as a sole basis for treatment. Nasal washings and aspirates are unacceptable for Xpert Xpress SARS-CoV-2/FLU/RSV testing.  Fact Sheet for Patients: BloggerCourse.com  Fact Sheet for Healthcare Providers: SeriousBroker.it  This test is not yet approved or cleared by the [place] FDA and has been authorized for detection and/or diagnosis of SARS-CoV-2 by FDA under an Emergency Use Authorization (EUA). This EUA will remain in effect (meaning this test can be used) for the duration of the COVID-19 declaration under Section 564(b)(1) of the Act, 21 U.S.C. section 360bbb-3(b)(1), unless the authorization is terminated or revoked.  Performed at [facility], [address], [place], [place] [address]   Surgical pcr screen     Status: None   Collection Time: 07/12/20 10:12 AM   Specimen: Nasal Mucosa; Nasal Swab  Result Value Ref Range Status   MRSA, PCR NEGATIVE NEGATIVE Final   Staphylococcus aureus NEGATIVE NEGATIVE Final    Comment: (NOTE) The Xpert SA Assay (FDA approved for NASAL specimens in patients 53 years of age and older), is one component of a comprehensive surveillance program. It is not intended to diagnose infection nor to guide or monitor treatment. Performed at [facility], [address] [place], [place] [address]      Radiology Studies: No results found.  Scheduled Meds: . sodium chloride   Intravenous Once  . aspirin EC  81 mg Oral BID  . Chlorhexidine Gluconate Cloth  6 each Topical Daily  . feeding supplement  237 mL Oral TID BM  . insulin aspart  0-15 Units Subcutaneous TID AC & HS  . insulin glargine  11 Units Subcutaneous Daily  . iron polysaccharides  150 mg Oral BID  . levETIRAcetam  500 mg  Oral BID  . memantine  10 mg Oral BID  . sertraline  150 mg Oral Daily   Continuous Infusions:    LOS: 5 days    Time spent: 35 minutes.   Vassie Loll, MD [facility]   To contact the attending provider between 7A-7P or the covering provider during after hours 7P-7A, please log into the web site [email or web address] and access using universal  password for that web site. If you do not have the password, please call the hospital operator.  07/17/2020, 5:29 PM

## 2020-07-18 ENCOUNTER — Inpatient Hospital Stay
Admission: RE | Admit: 2020-07-18 | Discharge: 2020-08-29 | Disposition: A | Discharge status: Skilled Nursing Facility | Payer: Medicare HMO | Source: Ambulatory Visit | Attending: Internal Medicine | Admitting: Internal Medicine

## 2020-07-18 DIAGNOSIS — Y92002 Bathroom of unspecified non-institutional (private) residence single-family (private) house as the place of occurrence of the external cause: Secondary | ICD-10-CM | POA: Diagnosis not present

## 2020-07-18 DIAGNOSIS — S72142A Displaced intertrochanteric fracture of left femur, initial encounter for closed fracture: Secondary | ICD-10-CM | POA: Diagnosis not present

## 2020-07-18 DIAGNOSIS — I152 Hypertension secondary to endocrine disorders: Secondary | ICD-10-CM | POA: Diagnosis not present

## 2020-07-18 DIAGNOSIS — E440 Moderate protein-calorie malnutrition: Secondary | ICD-10-CM | POA: Diagnosis not present

## 2020-07-18 DIAGNOSIS — E118 Type 2 diabetes mellitus with unspecified complications: Secondary | ICD-10-CM

## 2020-07-18 DIAGNOSIS — D62 Acute posthemorrhagic anemia: Secondary | ICD-10-CM

## 2020-07-18 DIAGNOSIS — Z794 Long term (current) use of insulin: Secondary | ICD-10-CM

## 2020-07-18 DIAGNOSIS — M25552 Pain in left hip: Secondary | ICD-10-CM | POA: Diagnosis not present

## 2020-07-18 DIAGNOSIS — I10 Essential (primary) hypertension: Secondary | ICD-10-CM | POA: Diagnosis not present

## 2020-07-18 DIAGNOSIS — Z96653 Presence of artificial knee joint, bilateral: Secondary | ICD-10-CM | POA: Diagnosis not present

## 2020-07-18 DIAGNOSIS — Z20822 Contact with and (suspected) exposure to covid-19: Secondary | ICD-10-CM | POA: Diagnosis not present

## 2020-07-18 DIAGNOSIS — E1151 Type 2 diabetes mellitus with diabetic peripheral angiopathy without gangrene: Secondary | ICD-10-CM | POA: Diagnosis not present

## 2020-07-18 DIAGNOSIS — Z23 Encounter for immunization: Secondary | ICD-10-CM | POA: Diagnosis not present

## 2020-07-18 DIAGNOSIS — Z79899 Other long term (current) drug therapy: Secondary | ICD-10-CM | POA: Diagnosis not present

## 2020-07-18 DIAGNOSIS — D72829 Elevated white blood cell count, unspecified: Secondary | ICD-10-CM | POA: Diagnosis not present

## 2020-07-18 DIAGNOSIS — E785 Hyperlipidemia, unspecified: Secondary | ICD-10-CM | POA: Diagnosis not present

## 2020-07-18 DIAGNOSIS — S0083XA Contusion of other part of head, initial encounter: Secondary | ICD-10-CM | POA: Diagnosis not present

## 2020-07-18 DIAGNOSIS — R7401 Elevation of levels of liver transaminase levels: Secondary | ICD-10-CM | POA: Diagnosis not present

## 2020-07-18 DIAGNOSIS — R404 Transient alteration of awareness: Secondary | ICD-10-CM | POA: Diagnosis not present

## 2020-07-18 DIAGNOSIS — G309 Alzheimer's disease, unspecified: Secondary | ICD-10-CM

## 2020-07-18 DIAGNOSIS — F0280 Dementia in other diseases classified elsewhere without behavioral disturbance: Secondary | ICD-10-CM

## 2020-07-18 DIAGNOSIS — S72002A Fracture of unspecified part of neck of left femur, initial encounter for closed fracture: Secondary | ICD-10-CM | POA: Diagnosis not present

## 2020-07-18 DIAGNOSIS — E1159 Type 2 diabetes mellitus with other circulatory complications: Secondary | ICD-10-CM | POA: Diagnosis not present

## 2020-07-18 DIAGNOSIS — Z8781 Personal history of (healed) traumatic fracture: Secondary | ICD-10-CM | POA: Diagnosis not present

## 2020-07-18 DIAGNOSIS — E1165 Type 2 diabetes mellitus with hyperglycemia: Secondary | ICD-10-CM | POA: Diagnosis not present

## 2020-07-18 DIAGNOSIS — W19XXXA Unspecified fall, initial encounter: Secondary | ICD-10-CM | POA: Diagnosis not present

## 2020-07-18 DIAGNOSIS — F32A Depression, unspecified: Secondary | ICD-10-CM | POA: Diagnosis not present

## 2020-07-18 DIAGNOSIS — N179 Acute kidney failure, unspecified: Secondary | ICD-10-CM | POA: Diagnosis not present

## 2020-07-18 DIAGNOSIS — Z87891 Personal history of nicotine dependence: Secondary | ICD-10-CM | POA: Diagnosis not present

## 2020-07-18 DIAGNOSIS — I700 Atherosclerosis of aorta: Secondary | ICD-10-CM | POA: Diagnosis not present

## 2020-07-18 DIAGNOSIS — F0390 Unspecified dementia without behavioral disturbance: Secondary | ICD-10-CM | POA: Diagnosis not present

## 2020-07-18 DIAGNOSIS — S8001XA Contusion of right knee, initial encounter: Secondary | ICD-10-CM | POA: Diagnosis not present

## 2020-07-18 DIAGNOSIS — S72142D Displaced intertrochanteric fracture of left femur, subsequent encounter for closed fracture with routine healing: Secondary | ICD-10-CM | POA: Diagnosis not present

## 2020-07-18 DIAGNOSIS — Z7984 Long term (current) use of oral hypoglycemic drugs: Secondary | ICD-10-CM | POA: Diagnosis not present

## 2020-07-18 DIAGNOSIS — S0003XA Contusion of scalp, initial encounter: Secondary | ICD-10-CM | POA: Diagnosis not present

## 2020-07-18 DIAGNOSIS — S72142S Displaced intertrochanteric fracture of left femur, sequela: Secondary | ICD-10-CM | POA: Diagnosis not present

## 2020-07-18 DIAGNOSIS — D519 Vitamin B12 deficiency anemia, unspecified: Secondary | ICD-10-CM | POA: Diagnosis not present

## 2020-07-18 DIAGNOSIS — Z96652 Presence of left artificial knee joint: Secondary | ICD-10-CM | POA: Diagnosis not present

## 2020-07-18 DIAGNOSIS — S8002XA Contusion of left knee, initial encounter: Secondary | ICD-10-CM | POA: Diagnosis not present

## 2020-07-18 DIAGNOSIS — M25562 Pain in left knee: Secondary | ICD-10-CM | POA: Diagnosis not present

## 2020-07-18 DIAGNOSIS — Z7982 Long term (current) use of aspirin: Secondary | ICD-10-CM | POA: Diagnosis not present

## 2020-07-18 DIAGNOSIS — E1169 Type 2 diabetes mellitus with other specified complication: Secondary | ICD-10-CM | POA: Diagnosis not present

## 2020-07-18 DIAGNOSIS — Z96651 Presence of right artificial knee joint: Secondary | ICD-10-CM | POA: Diagnosis not present

## 2020-07-18 DIAGNOSIS — S0990XA Unspecified injury of head, initial encounter: Secondary | ICD-10-CM | POA: Diagnosis present

## 2020-07-18 DIAGNOSIS — E119 Type 2 diabetes mellitus without complications: Secondary | ICD-10-CM | POA: Diagnosis not present

## 2020-07-18 DIAGNOSIS — Z043 Encounter for examination and observation following other accident: Secondary | ICD-10-CM | POA: Diagnosis not present

## 2020-07-18 LAB — BASIC METABOLIC PANEL
Anion gap: 9 (ref 5–15)
BUN: 17 mg/dL (ref 8–23)
CO2: 26 mmol/L (ref 22–32)
Calcium: 8.1 mg/dL — ABNORMAL LOW (ref 8.9–10.3)
Chloride: 104 mmol/L (ref 98–111)
Creatinine, Ser: 0.72 mg/dL (ref 0.44–1.00)
GFR, Estimated: >60 mL/min (ref >60)
Glucose, Bld: 168 mg/dL — ABNORMAL HIGH (ref 70–99)
Potassium: 3.3 mmol/L — ABNORMAL LOW (ref 3.5–5.1)
Sodium: 139 mmol/L (ref 135–145)

## 2020-07-18 LAB — RESP PANEL BY RT-PCR (FLU A&B, COVID) ARPGX2
Influenza A by PCR: NEGATIVE
Influenza B by PCR: NEGATIVE
SARS Coronavirus 2 by RT PCR: NEGATIVE

## 2020-07-18 LAB — CBC
HCT: 27.9 % — ABNORMAL LOW (ref 36.0–46.0)
Hemoglobin: 9.1 g/dL — ABNORMAL LOW (ref 12.0–15.0)
MCH: 30.6 pg (ref 26.0–34.0)
MCHC: 32.6 g/dL (ref 30.0–36.0)
MCV: 93.9 fL (ref 80.0–100.0)
Platelets: 156 10*3/uL (ref 150–400)
RBC: 2.97 MIL/uL — ABNORMAL LOW (ref 3.87–5.11)
RDW: 14.6 % (ref 11.5–15.5)
WBC: 7.5 10*3/uL (ref 4.0–10.5)
nRBC: 0 % (ref 0.0–0.2)

## 2020-07-18 LAB — GLUCOSE, CAPILLARY
Glucose-Capillary: 165 mg/dL — ABNORMAL HIGH (ref 70–99)
Glucose-Capillary: 202 mg/dL — ABNORMAL HIGH (ref 70–99)

## 2020-07-18 MED ORDER — ENSURE ENLIVE PO LIQD: 237.0000 mL | Freq: Two times a day (BID) | ORAL | Status: DC

## 2020-07-18 MED ORDER — HYDROCODONE-ACETAMINOPHEN 5-300 MG PO TABS
1.0000 | ORAL_TABLET | Freq: Three times a day (TID) | ORAL | 0 refills | Status: DC | PRN
Start: 2020-07-18 — End: 2020-07-19

## 2020-07-18 MED ORDER — LANTUS SOLOSTAR 100 UNIT/ML ~~LOC~~ SOPN: 11.0000 [IU] | PEN_INJECTOR | Freq: Every day | SUBCUTANEOUS | Status: DC

## 2020-07-18 MED ORDER — ASPIRIN 81 MG PO TBEC: 81.0000 mg | DELAYED_RELEASE_TABLET | Freq: Two times a day (BID) | ORAL | 11 refills | Status: DC

## 2020-07-18 MED ORDER — POTASSIUM CHLORIDE CRYS ER 20 MEQ PO TBCR
40.0000 meq | EXTENDED_RELEASE_TABLET | Freq: Once | ORAL | Status: AC
  Administered 2020-07-18: 40 meq via ORAL
  Filled 2020-07-18: qty 2

## 2020-07-18 MED ORDER — POLYSACCHARIDE IRON COMPLEX 150 MG PO CAPS: 150.0000 mg | ORAL_CAPSULE | Freq: Two times a day (BID) | ORAL | 1 refills | Status: DC

## 2020-07-18 MED ORDER — ACETAMINOPHEN 325 MG PO TABS: 650.0000 mg | ORAL_TABLET | Freq: Four times a day (QID) | ORAL | Status: AC | PRN

## 2020-07-18 NOTE — Progress Notes (Signed)
Physical Therapy Treatment Patient Details Name: Misty Price MRN: [id number] DOB: 07/01/1935 Today's Date: 07/18/2020    History of Present Illness Misty Price is a 85 y.o. female s/p Cephalomedullary nail for Left intertrochanteric femur fracture on 07/14/20 with medical history significant for dementia, T2DM, syncope, hypertension, vitamin B12 deficiency who presents to the emergency department due to fall sustained at home.  Patient was unable to provide history, possibly due to underlying dementia.  History was obtained from ED and ED medical record.  Per report, patient slipped and fell on the left hip, EMS was activated, pain medication was given by EMS team in route to the hospital.    PT Comments    Patient demonstrates improvement for maintaining sitting balance with increased tolerance for weightbearing on left buttocks while seated at bedside, very unsteady on feet and limited to a few shuffling side steps to transfer to chair with c/o increased left hip pain.  Patient tolerated sitting up in chair after therapy - RN notified.  Patient will benefit from continued physical therapy in hospital and recommended venue below to increase strength, balance, endurance for safe ADLs and gait.    Follow Up Recommendations  SNF     Equipment Recommendations  None recommended by PT    Recommendations for Other Services       Precautions / Restrictions Precautions Precautions: Fall Restrictions Weight Bearing Restrictions: Yes LLE Weight Bearing: Weight bearing as tolerated    Mobility  Bed Mobility Overal bed mobility: Needs Assistance Bed Mobility: Supine to Sit     Supine to sit: Mod assist;Max assist     General bed mobility comments: increased time, labored movement, increased pain left hip with movement    Transfers Overall transfer level: Needs assistance Equipment used: Rolling walker (2 wheeled) Transfers: Sit to/from [employer] Sit to  Stand: Mod assist Stand pivot transfers: Mod assist;Max assist       General transfer comment: increased time, labored movement with difficulty advancing LLE  Ambulation/Gait Ambulation/Gait assistance: Max assist Gait Distance (Feet): 4 Feet Assistive device: Rolling walker (2 wheeled) Gait Pattern/deviations: Decreased step length - right;Decreased step length - left;Decreased stance time - left;Decreased stride length;Antalgic;Shuffle Gait velocity: slow   General Gait Details: limited to 4-5 slow labored shuffling steps with c/o increasing left hip with weighbearing   Stairs             Wheelchair Mobility    Modified Rankin (Stroke Patients Only)       Balance Overall balance assessment: Needs assistance Sitting-balance support: Feet supported;No upper extremity supported Sitting balance-Leahy Scale: Fair Sitting balance - Comments: fair/good seated at EOB   Standing balance support: During functional activity;Bilateral upper extremity supported Standing balance-Leahy Scale: Poor Standing balance comment: fair/poor using RW                            Cognition Arousal/Alertness: Awake/alert Behavior During Therapy: WFL for tasks assessed/performed Overall Cognitive Status: History of cognitive impairments - at baseline                                        Exercises Total Joint Exercises Ankle Circles/Pumps: Supine;10 reps;Left;Strengthening;AROM Short Arc Quad: Supine;10 reps;Left;Strengthening;AAROM;AROM Heel Slides: Supine;10 reps;Left;Strengthening;AAROM;AROM    General Comments        Pertinent Vitals/Pain Pain Assessment: Faces Faces Pain Scale: Hurts even more  Pain Location: left hip with movement Pain Descriptors / Indicators: Grimacing;Guarding Pain Intervention(s): Limited activity within patient's tolerance;Premedicated before session;Monitored during session;Repositioned    Home Living                       Prior Function            PT Goals (current goals can now be found in the care plan section) Acute Rehab PT Goals Patient Stated Goal: return home PT Goal Formulation: With patient Time For Goal Achievement: 07/28/20 Potential to Achieve Goals: Good Progress towards PT goals: Progressing toward goals    Frequency    Min 3X/week      PT Plan Current plan remains appropriate    Co-evaluation              AM-PAC PT "6 Clicks" Mobility   Outcome Measure  Help needed turning from your back to your side while in a flat bed without using bedrails?: A Lot Help needed moving from lying on your back to sitting on the side of a flat bed without using bedrails?: A Lot Help needed moving to and from a bed to a chair (including a wheelchair)?: A Lot Help needed standing up from a chair using your arms (e.g., wheelchair or bedside chair)?: A Lot Help needed to walk in hospital room?: A Lot Help needed climbing 3-5 steps with a railing? : Total 6 Click Score: 11    End of Session   Activity Tolerance: Patient tolerated treatment well;Patient limited by fatigue Patient left: in chair;with call bell/phone within reach;with chair alarm set Nurse Communication: Mobility status PT Visit Diagnosis: Unsteadiness on feet (R26.81);Other abnormalities of gait and mobility (R26.89);Muscle weakness (generalized) (M62.81);Pain Pain - Right/Left: Left Pain - part of body: Hip     Time: 7096-2836 PT Time Calculation (min) (ACUTE ONLY): 25 min  Charges:  $Therapeutic Exercise: 8-22 mins $Therapeutic Activity: 8-22 mins                     3:39 PM, 07/18/20 Ocie Bob, MPT Physical Therapist with [facility] [phone] office [phone] mobile phone

## 2020-07-18 NOTE — Discharge Summary (Signed)
Physician Discharge Summary  Shemiah Rosch NOM:[id number] DOB: [birth year] DOA: 07/11/2020  PCP: Benita Stabile, MD  Admit date: 07/11/2020 Discharge date: 07/18/2020  Time spent: 35 minutes  Recommendations for Outpatient Follow-up:  Take medications as prescribed Follow-up with orthopedic service in 2 weeks  Maintain adequate hydration Repeat CBC in 5 days to follow hemoglobin trend/stability  Repeat basic metabolic panel in 5 days to follow electrolytes and renal function. Heart healthy/modified carb diet recommended. DVT prophylaxis using aspirin 81 mg twice a day for 6 weeks Weightbearing as tolerated with left hip precautions.  Discharge Diagnoses:  Principal Problem:   Closed comminuted intertrochanteric fracture of proximal end of left femur [facility]) Active Problems:   Fall   Primary hypertension   Leukocytosis   Hyperglycemia due to diabetes mellitus (HCC)   Transaminitis   Hyperlipidemia   Postoperative anemia due to acute blood loss   Discharge Condition: Stable and improved.  Discharge to skilled nursing facility for further care and rehabilitation.  CODE STATUS: Full code.  Diet recommendation: Heart healthy and modify carbohydrate diet.  Filed Weights   07/11/20 2150 07/12/20 0145 07/12/20 0235  Weight: 72 kg 69.9 kg 69.9 kg    History of present illness:  Misty Hoffmanis a 85 y.o.femalewith medical history significant fordementia, T2DM, syncope, hypertension, vitamin B12 deficiency who presents to the emergency department due to fall sustained at home. Patient was unable to provide history, possibly due to underlying dementia. History was obtained from ED and ED medical record. Per report, patient slipped and fell on the left hip, EMS was activated, pain medication was given by EMS team in route to the hospital.  ED Course: In the emergency department, she was hemodynamically stable. Work-up in the ED showed normocytic anemia, leukocytosis,  hyperglycemia, elevated liver enzymes (AST 185, ALT 66). SARS coronavirus 2 was negative. CT head without contrast and CT cervical spine without contrast showed no acute intracranial abnormality or any acute fracture or static subluxation of the cervical spine.  Hospital Course:  1-Closed comminuted intertrochanteric fracture of proximal end of left femur (HCC) -Continue as needed analgesics -status post Intramedullar nail surgery on 07/13/20 -weight as tolerated (with left hip precaution) -aspirin 81mg  BID for DVT prophylaxis  -follow up with orthopedic service in 2 weeks -PT recommending SNF  2-transaminitis/hyperlipidemia -In the setting of acute phase reactant, use of statins and dehydration -After fluid resuscitation LFTs significantly improved and essentially back to normal. -Will resume the use of statins. -Heart healthy/low-fat diet encouraged.  3-acute kidney injury/dehydration -Resolved after fluid resuscitation -Continue to follow renal function trend. -Safe to resume home medications at discharge.  4-hypertension -Blood pressure stable currently -Resume home antihypertensive agents and follow vital signs. -Heart healthy diet encouraged.  5-type 2 diabetes mellitus with chronic insulin therapy and hyperglycemia -Resume home hypoglycemic management and follow CBGs fluctuation. -continue modified carb diet -resume tradjenta at discharge.  6-B12 deficiency -Continue monthly injections.  7-leukocytosis -In the setting of demargination -No acute infection appreciated -WBCs within normal limits at discharge. -received ancef prophylactically.  -No further antibiotics required.  8-post operative acute on chronic blood loss anemia -Hemoglobin has dropped to 7; on admission hemoglobin around 11. -Continue Niferex twice a day -Status post 1 unit PRBCs; globin 9.1 at discharge. -Continue to follow hemoglobin trend; repeat CBC in 5 days. -No overt bleeding  appreciated.  Procedures: See below for x-ray reports Anticipated intramedullary nail intertrochanteric left hip 07/13/20  Consultations:  Orthopedic service  Discharge Exam: Vitals:   07/17/20 1454 07/17/20  2013  BP: (!) 139/58 (!) 155/73  Pulse: 85 94  Resp: 18 18  Temp: 98.7 F (37.1 C) 98.4 F (36.9 C)  SpO2: 95% 95%    General exam:  Alert, awake and oriented x1; following commands without difficulties and in good spirit.  Reports some pain in her left hip, otherwise in no acute distress.  There is no fever, chest pain, nausea, vomiting or any other complaints. Respiratory system: Clear to auscultation. Respiratory effort normal.  No using accessory muscles.  No requiring oxygen supplementation. Cardiovascular system:RRR. No murmurs, rubs, gallops.  No JVD. Gastrointestinal system: Abdomen is nondistended, soft and nontender. No organomegaly or masses felt. Normal bowel sounds heard. Central nervous system: No focal neurological deficits. Extremities: No cyanosis or clubbing. Skin: No rashes, no petechiae. Psychiatry: Mood & affect appropriate.   Discharge Instructions   Discharge Instructions    Diet - low sodium heart healthy   Complete by: As directed    Diet Carb Modified   Complete by: As directed    Discharge instructions   Complete by: As directed    Take medications as prescribed Follow-up with orthopedic service in 2 weeks  Maintain adequate hydration Repeat CBC in 5 days to follow hemoglobin trend/stability  Repeat basic metabolic panel in 5 days to follow electrolytes and renal function. Heart healthy/modified carb diet recommended. DVT prophylaxis using aspirin 81 mg twice a day for 6 weeks Weightbearing as tolerated with left hip precautions.   Discharge wound care:   Complete by: As directed    Keep area clean and dry and reinforced dressing as needed.   Increase activity slowly   Complete by: As directed      Allergies as of 07/18/2020       Reactions   Keppra [levetiracetam]    Daughter states pt was "out of it" after taking it about 2 or 3 times      Medication List    TAKE these medications   acetaminophen 325 MG tablet Commonly known as: TYLENOL Take 2 tablets (650 mg total) by mouth every 6 (six) hours as needed for mild pain or fever.   aspirin 81 MG EC tablet Take 1 tablet (81 mg total) by mouth 2 (two) times daily. Swallow whole. What changed:   when to take this  additional instructions   atorvastatin 40 MG tablet Commonly known as: LIPITOR atorvastatin 40 mg tablet  TAKE 1 TABLET BY MOUTH IN THE EVENING FOR HIGH CHOLESTEROL   feeding supplement Liqd Take 237 mLs by mouth 2 (two) times daily between meals.   HYDROcodone-Acetaminophen 5-300 MG Tabs Take 1 tablet by mouth every 8 (eight) hours as needed for up to 5 days (severe pain).   icosapent Ethyl 1 g capsule Commonly known as: VASCEPA Vascepa 1 gram capsule   iron polysaccharides 150 MG capsule Commonly known as: NIFEREX Take 1 capsule (150 mg total) by mouth 2 (two) times daily.   Lantus SoloStar 100 UNIT/ML Solostar Pen Generic drug: insulin glargine Inject 11 Units into the skin at bedtime.   levETIRAcetam 500 MG tablet Commonly known as: KEPPRA TAKE 1 TABLET BY MOUTH TWICE DAILY   linagliptin 5 MG Tabs tablet Commonly known as: TRADJENTA Take 5 mg by mouth daily.   lisinopril 20 MG tablet Commonly known as: ZESTRIL Take 20 mg by mouth daily.   memantine 10 MG tablet Commonly known as: NAMENDA Take 10 mg by mouth 2 (two) times daily.   sertraline 100 MG tablet Commonly known as:  ZOLOFT Take 150 mg by mouth daily. Take  in morning and 50 mg at bedtime            Discharge Care Instructions  (From admission, onward)         Start     Ordered   07/18/20 0000  Discharge wound care:       Comments: Keep area clean and dry and reinforced dressing as needed.   07/18/20 1209         Allergies  Allergen  Reactions   Keppra [Levetiracetam]     Daughter states pt was "out of it" after taking it about 2 or 3 times    Contact information for follow-up providers    Benita Stabile, MD. Schedule an appointment as soon as possible for a visit in 10 day(s).   Specialty: Internal Medicine Why: after discharge from SNF Contact information: [address] [place] [facility] [address] [phone]        Antoine Poche, MD .   Specialty: Cardiology Contact information: [address] [place] [address] [phone]        Oliver Barre, MD. Schedule an appointment as soon as possible for a visit in 2 week(s).   Specialties: Orthopedic Surgery, Sports Medicine Contact information: [address] [place] [address] [phone]            Contact information for after-discharge care    Destination    [facility] .   Service: Skilled Nursing Contact information: [address] [place] [address] [phone]                   The results of significant diagnostics from this hospitalization (including imaging, microbiology, ancillary and laboratory) are listed below for reference.    Significant Diagnostic Studies: DG Chest 1 View  Result Date: 07/11/2020 CLINICAL DATA:  Hip pain EXAM: CHEST  1 VIEW COMPARISON:  09/26/2019 FINDINGS: Borderline to mild cardiomegaly. Both lungs are clear. Aortic atherosclerosis. The visualized skeletal structures are unremarkable. IMPRESSION: No active disease. Borderline to mild cardiomegaly. Electronically Signed   By: Jasmine Pang M.D.   On: 07/11/2020 22:52   CT Head Wo Contrast  Result Date: 07/11/2020 CLINICAL DATA:  Fall EXAM: CT HEAD WITHOUT CONTRAST CT CERVICAL SPINE WITHOUT CONTRAST TECHNIQUE: Multidetector CT imaging of the head and cervical spine was performed following the standard protocol without intravenous contrast. Multiplanar CT image reconstructions of the  cervical spine were also generated. COMPARISON:  Head CT 09/26/2019 FINDINGS: CT HEAD FINDINGS Brain: There is no mass, hemorrhage or extra-axial collection. The size and configuration of the ventricles and extra-axial CSF spaces are normal. The brain parenchyma is normal, without evidence of acute or chronic infarction. Vascular: No abnormal hyperdensity of the major intracranial arteries or dural venous sinuses. No intracranial atherosclerosis. Skull: The visualized skull base, calvarium and extracranial soft tissues are normal. Sinuses/Orbits: No fluid levels or advanced mucosal thickening of the visualized paranasal sinuses. No mastoid or middle ear effusion. The orbits are normal. CT CERVICAL SPINE FINDINGS Alignment: No static subluxation. Facets are aligned. Occipital condyles are normally positioned. Skull base and vertebrae: No acute fracture. Soft tissues and spinal canal: No prevertebral fluid or swelling. No visible canal hematoma. Disc levels: No advanced spinal canal or neural foraminal stenosis. Facets are fused on the left at C2-3 and on the right at C4-5. Upper chest: No pneumothorax, pulmonary nodule or pleural effusion. Other: Normal visualized  paraspinal cervical soft tissues. IMPRESSION: 1. No acute intracranial abnormality. 2. No acute fracture or static subluxation of the cervical spine. Electronically Signed   By: Deatra Robinson M.D.   On: 07/11/2020 23:14   CT Cervical Spine Wo Contrast  Result Date: 07/11/2020 CLINICAL DATA:  Fall EXAM: CT HEAD WITHOUT CONTRAST CT CERVICAL SPINE WITHOUT CONTRAST TECHNIQUE: Multidetector CT imaging of the head and cervical spine was performed following the standard protocol without intravenous contrast. Multiplanar CT image reconstructions of the cervical spine were also generated. COMPARISON:  Head CT 09/26/2019 FINDINGS: CT HEAD FINDINGS Brain: There is no mass, hemorrhage or extra-axial collection. The size and configuration of the ventricles and  extra-axial CSF spaces are normal. The brain parenchyma is normal, without evidence of acute or chronic infarction. Vascular: No abnormal hyperdensity of the major intracranial arteries or dural venous sinuses. No intracranial atherosclerosis. Skull: The visualized skull base, calvarium and extracranial soft tissues are normal. Sinuses/Orbits: No fluid levels or advanced mucosal thickening of the visualized paranasal sinuses. No mastoid or middle ear effusion. The orbits are normal. CT CERVICAL SPINE FINDINGS Alignment: No static subluxation. Facets are aligned. Occipital condyles are normally positioned. Skull base and vertebrae: No acute fracture. Soft tissues and spinal canal: No prevertebral fluid or swelling. No visible canal hematoma. Disc levels: No advanced spinal canal or neural foraminal stenosis. Facets are fused on the left at C2-3 and on the right at C4-5. Upper chest: No pneumothorax, pulmonary nodule or pleural effusion. Other: Normal visualized paraspinal cervical soft tissues. IMPRESSION: 1. No acute intracranial abnormality. 2. No acute fracture or static subluxation of the cervical spine. Electronically Signed   By: Deatra Robinson M.D.   On: 07/11/2020 23:14   DG HIP OPERATIVE UNILAT W OR W/O PELVIS LEFT  Result Date: 07/13/2020 CLINICAL DATA:  85 year old female with a history of hip fracture, post fixation EXAM: OPERATIVE LEFT HIP (WITH PELVIS IF PERFORMED)  VIEWS TECHNIQUE: Fluoroscopic spot image(s) were submitted for interpretation post-operatively. COMPARISON:  None. FINDINGS: Multiple intraoperative fluoroscopic spot images. Images demonstrate left hip fracture with partial imaging of antegrade intramedullary rod placement and interlocking cannulated screw of the femoral neck. No complicating features. IMPRESSION: Intraoperative fluoroscopic spot images of left hip fracture fixation, as above, with no complicating features. Please refer to the dictated operative report for full details  of intraoperative findings and procedure. Electronically Signed   By: Gilmer Mor D.O.   On: 07/13/2020 12:01   DG HIP UNILAT WITH PELVIS 2-3 VIEWS LEFT  Result Date: 07/13/2020 CLINICAL DATA:  85 year old female postop EXAM: DG HIP (WITH OR WITHOUT PELVIS) 2-3V LEFT COMPARISON:  07/11/2020 FINDINGS: Interval surgical changes of antegrade intramedullary rod of the left femur with proximal cannulated femoral neck screw and mid shaft interlocking screw of the femur. Anatomic alignment relatively maintained. Gas within the soft tissues. No additional fracture identified. IMPRESSION: Early surgical changes of left hip fracture fixation with antegrade intramedullary rod, cannulated interlocking femoral neck screw, and mid shaft interlocking screw. Electronically Signed   By: Gilmer Mor D.O.   On: 07/13/2020 12:06   DG Hip Unilat With Pelvis 2-3 Views Left  Result Date: 07/11/2020 CLINICAL DATA:  Fall with left hip pain EXAM: DG HIP (WITH OR WITHOUT PELVIS) 2-3V LEFT COMPARISON:  09/26/2019 FINDINGS: Pubic symphysis is intact. Both femoral heads project in joint. Acute left intertrochanteric fracture with displaced lesser trochanteric fracture fragment. IMPRESSION: Acute mildly comminuted left intertrochanteric fracture Electronically Signed   By: Adrian Prows.D.  On: 07/11/2020 22:51   DG FEMUR MIN 2 VIEWS LEFT  Result Date: 07/12/2020 CLINICAL DATA:  LEFT hip fracture, operative planning EXAM: LEFT FEMUR 2 VIEWS COMPARISON:  LEFT hip radiographs 07/11/2020 FINDINGS: Osseous demineralization. LEFT knee prosthesis identified. Displaced intertrochanteric fracture LEFT femur. No additional fracture, dislocation, or bone destruction. Visualized LEFT pelvis intact. IMPRESSION: Displaced intertrochanteric fracture LEFT femur. Osseous demineralization with LEFT knee prosthesis noted. Electronically Signed   By: Ulyses Southward M.D.   On: 07/12/2020 14:54   DG FEMUR PORT MIN 2 VIEWS LEFT  Result Date:  07/13/2020 CLINICAL DATA:  Postoperative evaluation EXAM: LEFT FEMUR PORTABLE 2 VIEWS COMPARISON:  LEFT hip of the same date. FINDINGS: Gas in the soft tissues from recent operative manipulation of an inter trochanteric fracture with long intramedullary component extending towards the distal femur. Dynamic hip screw across the femoral neck with improved anatomic alignment and distal interlocking screw along the midportion of the intramedullary device in the proximal third of the femoral diaphysis. Lesser trochanter remains displaced. Signs of LEFT knee arthroplasty. IMPRESSION: ORIF of an intertrochanteric fracture, with improved anatomic alignment. Still with distraction of separate fragment of the lesser trochanter. LEFT knee arthroplasty. Electronically Signed   By: Donzetta Kohut M.D.   On: 07/13/2020 12:08    Microbiology: Recent Results (from the past 240 hour(s))  Resp Panel by RT-PCR (Flu A&B, Covid) Nasopharyngeal Swab     Status: None   Collection Time: 07/11/20 11:13 PM   Specimen: Nasopharyngeal Swab; Nasopharyngeal(NP) swabs in vial transport medium  Result Value Ref Range Status   SARS Coronavirus 2 by RT PCR NEGATIVE NEGATIVE Final    Comment: (NOTE) SARS-CoV-2 target nucleic acids are NOT DETECTED.  The SARS-CoV-2 RNA is generally detectable in upper respiratory specimens during the acute phase of infection. The lowest concentration of SARS-CoV-2 viral copies this assay can detect is 138 copies/mL. A negative result does not preclude SARS-Cov-2 infection and should not be used as the sole basis for treatment or other patient management decisions. A negative result may occur with  improper specimen collection/handling, submission of specimen other than nasopharyngeal swab, presence of viral mutation(s) within the areas targeted by this assay, and inadequate number of viral copies(<138 copies/mL). A negative result must be combined with clinical observations, patient history, and  epidemiological information. The expected result is Negative.  Fact Sheet for Patients:  BloggerCourse.com  Fact Sheet for Healthcare Providers:  SeriousBroker.it  This test is no t yet approved or cleared by the [place] FDA and  has been authorized for detection and/or diagnosis of SARS-CoV-2 by FDA under an Emergency Use Authorization (EUA). This EUA will remain  in effect (meaning this test can be used) for the duration of the COVID-19 declaration under Section 564(b)(1) of the Act, 21 U.S.C.section 360bbb-3(b)(1), unless the authorization is terminated  or revoked sooner.       Influenza A by PCR NEGATIVE NEGATIVE Final   Influenza B by PCR NEGATIVE NEGATIVE Final    Comment: (NOTE) The Xpert Xpress SARS-CoV-2/FLU/RSV plus assay is intended as an aid in the diagnosis of influenza from Nasopharyngeal swab specimens and should not be used as a sole basis for treatment. Nasal washings and aspirates are unacceptable for Xpert Xpress SARS-CoV-2/FLU/RSV testing.  Fact Sheet for Patients: BloggerCourse.com  Fact Sheet for Healthcare Providers: SeriousBroker.it  This test is not yet approved or cleared by the [place] FDA and has been authorized for detection and/or diagnosis of SARS-CoV-2 by FDA under an Emergency Use  Authorization (EUA). This EUA will remain in effect (meaning this test can be used) for the duration of the COVID-19 declaration under Section 564(b)(1) of the Act, 21 U.S.C. section 360bbb-3(b)(1), unless the authorization is terminated or revoked.  Performed at [facility], [address] [place], [place] [address]   Surgical pcr screen     Status: None   Collection Time: 07/12/20 10:12 AM   Specimen: Nasal Mucosa; Nasal Swab  Result Value Ref Range Status   MRSA, PCR NEGATIVE NEGATIVE Final   Staphylococcus aureus NEGATIVE NEGATIVE Final     Comment: (NOTE) The Xpert SA Assay (FDA approved for NASAL specimens in patients 83 years of age and older), is one component of a comprehensive surveillance program. It is not intended to diagnose infection nor to guide or monitor treatment. Performed at [facility], [address] [place], [place] [address]   Resp Panel by RT-PCR (Flu A&B, Covid) Nasopharyngeal Swab     Status: None   Collection Time: 07/18/20 10:08 AM   Specimen: Nasopharyngeal Swab; Nasopharyngeal(NP) swabs in vial transport medium  Result Value Ref Range Status   SARS Coronavirus 2 by RT PCR NEGATIVE NEGATIVE Final    Comment: (NOTE) SARS-CoV-2 target nucleic acids are NOT DETECTED.  The SARS-CoV-2 RNA is generally detectable in upper respiratory specimens during the acute phase of infection. The lowest concentration of SARS-CoV-2 viral copies this assay can detect is 138 copies/mL. A negative result does not preclude SARS-Cov-2 infection and should not be used as the sole basis for treatment or other patient management decisions. A negative result may occur with  improper specimen collection/handling, submission of specimen other than nasopharyngeal swab, presence of viral mutation(s) within the areas targeted by this assay, and inadequate number of viral copies(<138 copies/mL). A negative result must be combined with clinical observations, patient history, and epidemiological information. The expected result is Negative.  Fact Sheet for Patients:  BloggerCourse.comhttps://www.fda.gov/media/152166/download  Fact Sheet for Healthcare Providers:  SeriousBroker.ithttps://www.fda.gov/media/152162/download  This test is no t yet approved or cleared by the [place] FDA and  has been authorized for detection and/or diagnosis of SARS-CoV-2 by FDA under an Emergency Use Authorization (EUA). This EUA will remain  in effect (meaning this test can be used) for the duration of the COVID-19 declaration under Section 564(b)(1) of the Act,  21 U.S.C.section 360bbb-3(b)(1), unless the authorization is terminated  or revoked sooner.       Influenza A by PCR NEGATIVE NEGATIVE Final   Influenza B by PCR NEGATIVE NEGATIVE Final    Comment: (NOTE) The Xpert Xpress SARS-CoV-2/FLU/RSV plus assay is intended as an aid in the diagnosis of influenza from Nasopharyngeal swab specimens and should not be used as a sole basis for treatment. Nasal washings and aspirates are unacceptable for Xpert Xpress SARS-CoV-2/FLU/RSV testing.  Fact Sheet for Patients: BloggerCourse.comhttps://www.fda.gov/media/152166/download  Fact Sheet for Healthcare Providers: SeriousBroker.ithttps://www.fda.gov/media/152162/download  This test is not yet approved or cleared by the [place] FDA and has been authorized for detection and/or diagnosis of SARS-CoV-2 by FDA under an Emergency Use Authorization (EUA). This EUA will remain in effect (meaning this test can be used) for the duration of the COVID-19 declaration under Section 564(b)(1) of the Act, 21 U.S.C. section 360bbb-3(b)(1), unless the authorization is terminated or revoked.  Performed at [facility], [address] [place], [place] [address]      Labs: Basic Metabolic Panel: Recent Labs  Lab 07/11/20 2313 07/12/20 0349 07/13/20 0425 07/14/20 0544 07/18/20 0554  NA 137 137  138 140 139  K 4.1 4.9 4.0 3.7 3.3*  CL 103 101 103 104 104  CO2 27 25 25 27 26   GLUCOSE 278* 363* 238* 168* 168*  BUN 24* 27* 24* 18 17  CREATININE 1.05* 1.22* 0.98 0.91 0.72  CALCIUM 9.0 9.0 8.9 8.6* 8.1*  MG  --  1.9  --   --   --   PHOS  --  5.0*  --   --   --    Liver Function Tests: Recent Labs  Lab 07/11/20 2313 07/12/20 0349 07/13/20 0425  AST 185* 518* 81*  ALT 66* 238* 130*  ALKPHOS 114 153* 119  BILITOT 0.7 0.9 0.7  PROT 6.3* 6.0* 5.8*  ALBUMIN 3.5 3.5 3.2*   CBC: Recent Labs  Lab 07/11/20 2313 07/12/20 0349 07/14/20 0544 07/16/20 0526 07/18/20 0554  WBC 12.2* 9.4 8.3 7.2 7.5  NEUTROABS 9.1*  --   --   --    --   HGB 11.9* 11.5* 8.1* 7.2* 9.1*  HCT 36.6 36.0 25.0* 23.0* 27.9*  MCV 97.1 97.8 97.7 97.9 93.9  PLT 166 148* 120* 142* 156   CBG: Recent Labs  Lab 07/17/20 1155 07/17/20 1616 07/17/20 2105 07/18/20 0739 07/18/20 1122  GLUCAP 152* 162* 201* 165* 202*   Signed:  07/20/20 MD.  [facility] 07/18/2020, 12:17 PM

## 2020-07-18 NOTE — TOC Transition Note (Signed)
Transition of Care [facility]) - CM/SW Discharge Note   Patient Details  Name: Misty Price MRN: [id number] Date of Birth: December 01, 1935  Transition of [facility]) CM/SW Contact:  Leitha Bleak, RN Phone Number: 07/18/2020, 11:29 AM   Clinical Narrative:   Patient is medically ready for discharge. TOC spoke with Britta Mccreedy, daughter, [facility] bed offer accepted. Patient is vaccinated, Lynnea Ferrier will confirm with [facility] in [place].  COVID test is neg. RN to call report, Keymoni updated.     Final next level of care: Skilled Nursing Facility Barriers to Discharge: Barriers Resolved   Patient Goals and CMS Choice Patient states their goals for this hospitalization and ongoing recovery are:: to go to SNF, then home. CMS Medicare.gov Compare Post Acute Care list provided to:: Patient Represenative (must comment) Choice offered to / list presented to : Adult Children  Discharge Placement              Patient chooses bed at: [facility] Patient to be transferred to facility by: [facility] staff Name of family member notified: Joya - daughter Patient and family notified of of transfer: 07/18/20  Discharge Plan and Services In-house Referral: Clinical Social Work   Post Acute Care Choice: Skilled Nursing Facility            Readmission Risk Interventions Readmission Risk Prevention Plan 07/13/2020  Medication Screening Complete  Transportation Screening Complete

## 2020-07-19 ENCOUNTER — Encounter: Payer: Self-pay | Admitting: Adult Health

## 2020-07-19 ENCOUNTER — Other Ambulatory Visit: Payer: Self-pay | Admitting: Adult Health

## 2020-07-19 ENCOUNTER — Non-Acute Institutional Stay (SKILLED_NURSING_FACILITY): Payer: Medicare HMO | Admitting: Adult Health

## 2020-07-19 DIAGNOSIS — F0393 Unspecified dementia, unspecified severity, with mood disturbance: Secondary | ICD-10-CM

## 2020-07-19 DIAGNOSIS — F0390 Unspecified dementia without behavioral disturbance: Secondary | ICD-10-CM

## 2020-07-19 DIAGNOSIS — F32A Depression, unspecified: Secondary | ICD-10-CM | POA: Diagnosis not present

## 2020-07-19 DIAGNOSIS — R7401 Elevation of levels of liver transaminase levels: Secondary | ICD-10-CM

## 2020-07-19 DIAGNOSIS — S72142S Displaced intertrochanteric fracture of left femur, sequela: Secondary | ICD-10-CM

## 2020-07-19 DIAGNOSIS — E440 Moderate protein-calorie malnutrition: Secondary | ICD-10-CM

## 2020-07-19 DIAGNOSIS — D62 Acute posthemorrhagic anemia: Secondary | ICD-10-CM | POA: Diagnosis not present

## 2020-07-19 DIAGNOSIS — I700 Atherosclerosis of aorta: Secondary | ICD-10-CM | POA: Diagnosis not present

## 2020-07-19 DIAGNOSIS — E1169 Type 2 diabetes mellitus with other specified complication: Secondary | ICD-10-CM | POA: Diagnosis not present

## 2020-07-19 DIAGNOSIS — I152 Hypertension secondary to endocrine disorders: Secondary | ICD-10-CM

## 2020-07-19 DIAGNOSIS — E46 Unspecified protein-calorie malnutrition: Secondary | ICD-10-CM | POA: Insufficient documentation

## 2020-07-19 DIAGNOSIS — F0280 Dementia in other diseases classified elsewhere without behavioral disturbance: Secondary | ICD-10-CM

## 2020-07-19 DIAGNOSIS — E785 Hyperlipidemia, unspecified: Secondary | ICD-10-CM

## 2020-07-19 DIAGNOSIS — E1151 Type 2 diabetes mellitus with diabetic peripheral angiopathy without gangrene: Secondary | ICD-10-CM

## 2020-07-19 DIAGNOSIS — E1159 Type 2 diabetes mellitus with other circulatory complications: Secondary | ICD-10-CM | POA: Diagnosis not present

## 2020-07-19 MED ORDER — HYDROCODONE-ACETAMINOPHEN 5-325 MG PO TABS: 1.0000 | ORAL_TABLET | Freq: Three times a day (TID) | ORAL | 0 refills | Status: AC | PRN

## 2020-07-19 NOTE — Progress Notes (Signed)
Location:  [facility] Room Number: 132 Place of Service:  SNF (31)   CODE STATUS: full   Allergies  Allergen Reactions  . Keppra [Levetiracetam]     Daughter states pt was "out of it" after taking it about 2 or 3 times    Chief Complaint  Patient presents with  . Hospitalization Follow-up    HPI:  She is a 85 year old woman who has been hospitalized from 07-11-20 through 07-18-20. She apparently had a fall and sustained a left comminuted intertrochanteric fracture. She had a IM nail performed 07-13-20. She had acute anemia required transfusion. Her liver enzymes were elevated in a setting of acuate phase reaction with statin use and dehydration. With IV fluids and holding statin have returned to near normal. Her low k+ level has been supplemented. At this time she is here for short term rehab; however; she may need long term placement. She does have have pain with movement; there are no reports of agitation; no reports of poor appetite. She will continue to be followed for her chronic illnesses including:Hypertension associated with type 2 diabetes mellitus:   Hyperlipidemia associated with type 2 diabetes mellitus:  Dementia without behavioral disturbance unspecified dementia type   Past Medical History:  Diagnosis Date  . Dementia (HCC)   . Diabetes mellitus without complication (HCC)   . Syncopal episodes     Past Surgical History:  Procedure Laterality Date  . INTRAMEDULLARY (IM) NAIL INTERTROCHANTERIC Left 07/13/2020   Procedure: INTRAMEDULLARY (IM) NAIL INTERTROCHANTRIC;  Surgeon: Oliver Barre, MD;  Location: [facility];  Service: Orthopedics;  Laterality: Left;  . JOINT REPLACEMENT     bilateral knee replacement    Social History   Socioeconomic History  . Marital status: Married    Spouse name: Not on file  . Number of children: Not on file  . Years of education: Not on file  . Highest education level: Not on file  Occupational History  . Not  on file  Tobacco Use  . Smoking status: Former Games developer  . Smokeless tobacco: Never Used  Vaping Use  . Vaping Use: Never used  Substance and Sexual Activity  . Alcohol use: Never  . Drug use: Never  . Sexual activity: Not on file  Other Topics Concern  . Not on file  Social History Narrative   Right handed    Lives family husband daughter and granddaughter    Social Determinants of Health   Financial Resource Strain: Not on file  Food Insecurity: Not on file  Transportation Needs: Not on file  Physical Activity: Not on file  Stress: Not on file  Social Connections: Not on file  Intimate Partner Violence: Not on file   Family History  Problem Relation Age of Onset  . Hypertension Mother   . Heart attack Father       VITAL SIGNS BP 140/60   Pulse 86   Temp 97.8 F (36.6 C)   Resp 20   Ht 5\' 2"  (1.575 m)   Wt 154 lb (69.9 kg)   SpO2 96%   BMI 28.17 kg/m   Outpatient Encounter Medications as of 07/19/2020  Medication Sig  . acetaminophen (TYLENOL) 325 MG tablet Take 2 tablets (650 mg total) by mouth every 6 (six) hours as needed for mild pain or fever.  07/21/2020 aspirin EC 81 MG EC tablet Take 1 tablet (81 mg total) by mouth 2 (two) times daily. Swallow whole.  Marland Kitchen atorvastatin (LIPITOR)  40 MG tablet atorvastatin 40 mg tablet  TAKE 1 TABLET BY MOUTH IN THE EVENING FOR HIGH CHOLESTEROL  . feeding supplement (ENSURE ENLIVE / ENSURE PLUS) LIQD Take 237 mLs by mouth 2 (two) times daily between meals.  Marland Kitchen HYDROcodone-acetaminophen (NORCO) 5-325 MG tablet Take 1 tablet by mouth every 8 (eight) hours as needed for up to 5 days for moderate pain.  Marland Kitchen icosapent Ethyl (VASCEPA) 1 g capsule Vascepa 1 gram capsule  . insulin glargine (LANTUS SOLOSTAR) 100 UNIT/ML Solostar Pen Inject 11 Units into the skin at bedtime.  . iron polysaccharides (NIFEREX) 150 MG capsule Take 1 capsule (150 mg total) by mouth 2 (two) times daily.  Marland Kitchen levETIRAcetam (KEPPRA) 500 MG tablet TAKE 1 TABLET BY MOUTH  TWICE DAILY  . linagliptin (TRADJENTA) 5 MG TABS tablet Take 5 mg by mouth daily.  Marland Kitchen lisinopril (ZESTRIL) 20 MG tablet Take 20 mg by mouth daily.  . memantine (NAMENDA) 10 MG tablet Take 10 mg by mouth 2 (two) times daily.  . sertraline (ZOLOFT) 100 MG tablet Take 150 mg by mouth daily. Take 100mg  in morning and 50 mg at bedtime   No facility-administered encounter medications on file as of 07/19/2020.     SIGNIFICANT DIAGNOSTIC EXAMS  TODAY  07-11-20: left hip x-ray: Acute mildly comminuted left intertrochanteric fracture   07-11-20: chest x-ray: No active disease. Borderline to mild cardiomegaly.   07-11-20: ct of head and cervical spine:  1. No acute intracranial abnormality. 2. No acute fracture or static subluxation of the cervical spine.  07-12-20: left femur x-ray:  Displaced intertrochanteric fracture LEFT femur. Osseous demineralization with LEFT knee prosthesis noted.  LABS REVIEWED TODAY  07-11-20: wbc 12.2; hgb 11.9 hct 36.6; mcv 97.1 plt 166; glucose 278; bun 24; creat 1.05; k+ 4.1; na++ 137; ca 9.0 GFR 53; ast 185; alt 66; albumin 3.5 07-12-20: wbc 9.4; hgb 11.5; hct 36.0; mcv 97.8 plt 148; glucose 363; bun 27; creat 1.22; k+ 4.0; na++ 137; ca 9.0; GFR 44; mag 1.9 phos 5.0 ast 518; alt 238; albumin 3.5 hgb a1c 10.5 07-13-20: glucose 238; bun 24; creat 0.98; k+ 4.0; na++ 138; ca 8.9 GFR 58 ast 81; alt 130; albumin 3.2 07-14-20: wbc 8.3; hgb 8.1; hct 25.0; mcv 97.7 plt 120; glucose 168; bun 18; creat 0.91; k+ 3.7; na++ 140; ca 8.6 GFR >60 07-16-20: wbc 7.2; hgb 7.2; hct 23.0; mcv 97.9 plt 142;  07-18-20: wbc 7.2; hgb 9.1; hct 27.9; mcv 93.9 plt 156; glucose 168; bun 17; creat 0.72; k+ 3.3; na++ 139; ca 8.1 GFR >60  Review of Systems  Unable to perform ROS: Dementia (unable to participate )    Physical Exam Constitutional:      General: She is not in acute distress.    Appearance: She is well-developed and well-nourished. She is not diaphoretic.  Neck:     Thyroid: No  thyromegaly.  Cardiovascular:     Rate and Rhythm: Normal rate and regular rhythm.     Pulses: Normal pulses and intact distal pulses.     Heart sounds: Murmur heard.      Comments: 2/6 Pulmonary:     Effort: Pulmonary effort is normal. No respiratory distress.     Breath sounds: Normal breath sounds.  Abdominal:     General: Bowel sounds are normal. There is no distension.     Palpations: Abdomen is soft.     Tenderness: There is no abdominal tenderness.  Musculoskeletal:        General:  No edema.     Cervical back: Neck supple.     Right lower leg: No edema.     Left lower leg: No edema.     Comments: Is able to move all extremities 07-13-20: IM nail left hip  Lymphadenopathy:     Cervical: No cervical adenopathy.  Skin:    General: Skin is warm and dry.  Neurological:     Mental Status: She is alert. Mental status is at baseline.  Psychiatric:        Mood and Affect: Mood and affect and mood normal.        ASSESSMENT/ PLAN:  TODAY  1. Closed comminuted intertrochanteric fracture of left femur sequela: she is presently stable; will continue therapy as directed and will follow up with orthopedics as indicated; will continue asa 81 mg twice daily through 08-28-20. Will continue vicodin 5/325 mg every 8 hours as needed through 07-24-20.   2. Hypertension associated with type 2 diabetes mellitus: is stable b/p 140/60; will continue lisinopril 20 mg daily   3. Hyperlipidemia associated with type 2 diabetes mellitus: is stable will continue lipitor 40 mg daily and vascepa 1 gm daily  4. Dementia without behavioral disturbance unspecified dementia type: is without change weight is 154 pounds; will continue namenda 10 mg twice daily   5. Postoperative anemia due to acute blood loss: hgb is 9.1.  Is status post transfusion in the hospital; will continue niferex 150 mg twice daily   6. Depression due to dementia: is stable will continue zoloft 100 mg in the AM and 50 mg in the  PM  7. Transaminitis: her liver enzymes are improving. In the setting of acute phase reaction; dehydration ans statin use.  Will continue to monitor her status  8. Aortic atherosclerosis (ct 09-26-19) will monitor   9. Moderate protein calorie malnutrition: her albumin is 3.2 weight is 154 pounds will continue supplements as directed  10. Diabetes type 2 with peripheral artery disease: hgb a1c 10.5; will continue tradjenta 5 mg daily lantus 11 units nightly  11. Seizures:  Is without recent seizure activity: will continue keppra 500 mg twice daily   12. Dehydration: has resolved after receiving IVF.   Will check cbc; cmp    MD is aware of resident's narcotic use and is in agreement with current plan of care. We will attempt to wean resident as appropriate.  Synthia Innocent NP [facility]  Contact [phone] Monday through Friday 8am- 5pm  After hours call [phone]

## 2020-07-20 ENCOUNTER — Encounter: Payer: Self-pay | Admitting: Internal Medicine

## 2020-07-20 ENCOUNTER — Non-Acute Institutional Stay (SKILLED_NURSING_FACILITY): Payer: Medicare HMO | Admitting: Internal Medicine

## 2020-07-20 DIAGNOSIS — D72829 Elevated white blood cell count, unspecified: Secondary | ICD-10-CM

## 2020-07-20 DIAGNOSIS — D62 Acute posthemorrhagic anemia: Secondary | ICD-10-CM | POA: Diagnosis not present

## 2020-07-20 DIAGNOSIS — E1151 Type 2 diabetes mellitus with diabetic peripheral angiopathy without gangrene: Secondary | ICD-10-CM

## 2020-07-20 DIAGNOSIS — R7401 Elevation of levels of liver transaminase levels: Secondary | ICD-10-CM | POA: Diagnosis not present

## 2020-07-20 DIAGNOSIS — S72142S Displaced intertrochanteric fracture of left femur, sequela: Secondary | ICD-10-CM

## 2020-07-20 NOTE — Progress Notes (Signed)
NURSING HOME LOCATION: [facility] ROOM NUMBER: 132/P   CODE STATUS: Full Code   PCP: Benita Stabile, MD   This is a comprehensive admission note to [facility] performed on this date less than 30 days from date of admission. Included are preadmission medical/surgical history; reconciled medication list; family history; social history and comprehensive review of systems.  Corrections and additions to the records were documented. Comprehensive physical exam was also performed. Additionally a clinical summary was entered for each active diagnosis pertinent to this admission in the Problem List to enhance continuity of care.  HPI: Patient was hospitalized 2/15-2/22/2022 with closed comminuted intertrochanteric fracture of the proximal end of the left femur sustained in a fall.  Apparently the fall was observed by her husband.  He recently fractured his arm and was unable to catch her when she began to fall backwards after becoming somewhat dizzy while folding a blanket to put on the sofa.  She can provide no history of any prodrome. CT of the head without contrast and CT of the cervical spine revealed no significant abnormality. Intramedullary nailing was completed 2/17 by Dr Thane Edu.   Complications included leukocytosis, hyperglycemia, transaminitis, and postop anemia due to acute blood loss.  Leukocytosis was attributed to demargination as there were no signs or symptoms of active or acute infection.  She did receive Ancef prophylactically.  White count did normalize predischarge. Hemoglobin was approximately 11 @ admission and dropped to 7 postop.  Niferex was ordered twice daily and she received 1 unit of packed red cells.  Hemoglobin was 9.1 at discharge. Specifically AST peaked @ 518 & ALT @ 238. Pre discharge AST was 81 & ALT 130 attributed to acute phase reactant, statin therapy, and dehydration.  With fluid resuscitation LFTs significantly improved as noted above.  Statins which had been held were reinstated. AKI related to dehydration also resolved with fluid resuscitation. Postop weightbearing as tolerated was ordered with left hip precautions.  DVT prophylaxis was with 81 mg of aspirin twice daily.  Past medical and surgical history: Includes type 2 diabetes, history of syncope, essential hypertension, vitamin B12 deficiency, and history of dementia. She has had bilateral knee replacements.  Social history: Nondrinker, former smoker.  Family history: Limited history reviewed.   Review of systems: She could not tell me why she had been in the hospital.  She does not know why she fell.  Her daughter states that she has had no significant medical complaints PTA. PT/OT describes her as being groggy but complaining of pain at the operative site.   She was able to give me her daughter's correct month and date of birth but not the year.  She incorrectly she gave that year as [birth year].  Constitutional: No fever, significant weight change, fatigue  Eyes: No redness, discharge, pain, vision change ENT/mouth: No nasal congestion, purulent discharge, earache, change in hearing, sore throat  Cardiovascular: No chest pain, palpitations, paroxysmal nocturnal dyspnea, claudication, edema  Respiratory: No cough, sputum production, hemoptysis, DOE, significant snoring, apnea Gastrointestinal: No heartburn, dysphagia, abdominal pain, nausea /vomiting, rectal bleeding, melena, change in bowels Genitourinary: No dysuria, hematuria, pyuria, incontinence, nocturia Musculoskeletal: No joint stiffness, joint swelling Dermatologic: No rash, pruritus, change in appearance of skin Neurologic: No dizziness, headache, recent syncope, seizures, numbness, tingling Psychiatric: No significant anxiety, depression, insomnia, anorexia Endocrine: No change in hair/skin/nails, excessive thirst, excessive hunger, excessive urination  Hematologic/lymphatic: No significant bruising,  lymphadenopathy, abnormal bleeding Allergy/immunology: No itchy/watery eyes, significant sneezing, urticaria,  angioedema  Physical exam:  Pertinent or positive findings: Hair is thin and unkempt at this time.  Teeth are coated.  Her voice is somewhat high pitched and singsong.  Chest surprisingly clear.  Pedal pulses are decreased.  There is trace edema at the ankles.  She has bruising over the dorsum of the hand.  General appearance: Adequately nourished; no acute distress, increased work of breathing is present.   Lymphatic: No lymphadenopathy about the head, neck, axilla. Eyes: No conjunctival inflammation or lid edema is present. There is no scleral icterus. Ears:  External ear exam shows no significant lesions or deformities.   Nose:  External nasal examination shows no deformity or inflammation. Nasal mucosa are pink and moist without lesions, exudates Oral exam: Lips and gums are healthy appearing.There is no oropharyngeal erythema or exudate. Neck:  No thyromegaly, masses, tenderness noted.    Heart:  Normal rate and regular rhythm. S1 and S2 normal without gallop, murmur, click, rub.  Lungs:  without wheezes, rhonchi, rales, rubs. Abdomen: Bowel sounds are normal.  Abdomen is soft and nontender with no organomegaly, hernias, masses. GU: Deferred  Extremities:  No cyanosis, clubbing. Neurologic exam: Balance, Rhomberg, finger to nose testing could not be completed due to clinical state Skin: Warm & dry w/o tenting. No significant lesions or rash.  See clinical summary under each active problem in the Problem List with associated updated therapeutic plan

## 2020-07-23 NOTE — Assessment & Plan Note (Addendum)
07/12/2020 A1c 10.2% indicating uncontrolled DM Tradjenta & insulin with dose adjustment based on glucose  monitor

## 2020-07-23 NOTE — Patient Instructions (Signed)
See assessment and plan under each diagnosis in the problem list and acutely for this visit 

## 2020-07-23 NOTE — Assessment & Plan Note (Signed)
LFT monitor by PCP

## 2020-07-23 NOTE — Assessment & Plan Note (Signed)
No active bleeding dyscrasias 

## 2020-07-23 NOTE — Assessment & Plan Note (Signed)
PT/OT @ SNF. WBAT.

## 2020-07-26 ENCOUNTER — Ambulatory Visit (INDEPENDENT_AMBULATORY_CARE_PROVIDER_SITE_OTHER): Payer: Medicare HMO | Admitting: Orthopedic Surgery

## 2020-07-26 ENCOUNTER — Inpatient Hospital Stay: Payer: Medicare HMO

## 2020-07-26 ENCOUNTER — Encounter: Payer: Self-pay | Admitting: Orthopedic Surgery

## 2020-07-26 VITALS — BP 157/87 | HR 89

## 2020-07-26 DIAGNOSIS — Z8781 Personal history of (healed) traumatic fracture: Secondary | ICD-10-CM

## 2020-07-26 DIAGNOSIS — S72142D Displaced intertrochanteric fracture of left femur, subsequent encounter for closed fracture with routine healing: Secondary | ICD-10-CM

## 2020-07-26 NOTE — Patient Instructions (Signed)
Monitor lateral hip incisions  WBAT on left leg  PT daily  Recommend patient transferred to bedside chair at least 3 times daily  F/u 4 weeks

## 2020-07-26 NOTE — Progress Notes (Signed)
Orthopaedic Postop Note  Assessment: Misty Price is a 85 y.o. female s/p cephalomedullary nail for left intertrochanteric femur fracture  DOS: 07/13/2020  Plan: Sutures were trimmed X-rays demonstrate maintenance of alignment without adverse features. Continue weightbearing as tolerated on the left lower extremity. Anticipate continued improvement in her pain and ability to ambulate. Recommend that she be helped to a chair at bedside at least 3 times daily  Follow-up in 4 weeks with repeat x-rays  Follow-up: Return in about 4 weeks (around 08/23/2020). XR at next visit: AP pelvis and left hip/femur  Subjective:  Chief Complaint  Patient presents with  . Postop left hip fracture    History of Present Illness: Misty Price is a 85 y.o. female who presents following the above stated procedure.  She was discharged from the hospital, and has been at the [facility] since.  She does have continued pain in the left hip.  She also expresses some confusion, and needs to be reminded that she had surgery.  According to her daughter, she is working with physical therapy on a daily basis, but this is not happening over the weekend.  She has been most of the weekend days laying in bed.  No issues otherwise.  Review of Systems: No fevers or chills No numbness or tingling No Chest Pain No shortness of breath   Objective: BP (!) 157/87   Pulse 89   Physical Exam:  Elderly female, seated in wheelchair.  No acute distress.  Evaluation of left hip demonstrates well-healing surgical incisions without surrounding erythema or drainage.  The proximal incision has a small scab at the superior aspect. She notes pain with attempted manipulation of the left leg.  She cried out in pain when I touched her distal thigh.  Sensation is intact distally.  Active dorsiflexion of the ankle and the great toe.  Toes are warm and well perfused.  IMAGING: I personally ordered and reviewed the following  images:   X-ray of the left hip demonstrates maintenance of alignment status post intramedullary nail for displaced intertrochanteric femur fracture.  There is been no interval displacement.  There is been no migration of the hardware.  Impression: Stable left hip following operative fixation of intertrochanteric femur fracture.  Oliver Barre, MD 07/26/2020 11:23 AM

## 2020-08-02 ENCOUNTER — Encounter: Payer: Self-pay | Admitting: Adult Health

## 2020-08-02 ENCOUNTER — Non-Acute Institutional Stay (SKILLED_NURSING_FACILITY): Payer: Medicare HMO | Admitting: Adult Health

## 2020-08-02 DIAGNOSIS — F0390 Unspecified dementia without behavioral disturbance: Secondary | ICD-10-CM

## 2020-08-02 DIAGNOSIS — F32A Depression, unspecified: Secondary | ICD-10-CM

## 2020-08-02 DIAGNOSIS — S72142S Displaced intertrochanteric fracture of left femur, sequela: Secondary | ICD-10-CM

## 2020-08-02 DIAGNOSIS — F0280 Dementia in other diseases classified elsewhere without behavioral disturbance: Secondary | ICD-10-CM | POA: Diagnosis not present

## 2020-08-02 DIAGNOSIS — I700 Atherosclerosis of aorta: Secondary | ICD-10-CM | POA: Diagnosis not present

## 2020-08-02 DIAGNOSIS — F0393 Unspecified dementia, unspecified severity, with mood disturbance: Secondary | ICD-10-CM

## 2020-08-02 NOTE — Progress Notes (Signed)
Location:  [facility] Room Number: 222 Place of Service:  SNF (31)   CODE STATUS: full   Allergies  Allergen Reactions  . Keppra [Levetiracetam]     Daughter states pt was "out of it" after taking it about 2 or 3 times    Chief Complaint  Patient presents with  . Acute Visit    Care plan meeting.     HPI:  We have come together for her care plan meeting. Family present.  BIMS 6/30 mood 6/30. She does have some anxiety at times. She requires extensive assist with her adls.is able to feed herself. She is nonambulatory. She is occasionally incontinent of bladder and frequently incontinent of bowel. There have been no falls. Her cbg readings are stable. Therapy max for lower body min for upper body; standing moderate; taking steps 5-10 feet assist with brp mod assist  Weight 155.8 pounds in her usual range with fair appetite; ensure stopped as she has declined these.  Spends most of her time in her room per her choice. She has a poor tolerance for pain.  She will continue to be followed for her chronic illnesses including: Aortic atherosclerosis Depression due to dementia Dementia without behavioral disturbance unspecified dementia type Closed comminuted intertrochanteric fracture of left femur sequela.     Past Medical History:  Diagnosis Date  . Dementia (HCC)   . Syncopal episodes   . Type 2 diabetes mellitus with neurologic complication [facility])     Past Surgical History:  Procedure Laterality Date  . INTRAMEDULLARY (IM) NAIL INTERTROCHANTERIC Left 07/13/2020   Procedure: INTRAMEDULLARY (IM) NAIL INTERTROCHANTRIC;  Surgeon: Oliver Barre, MD;  Location: [facility];  Service: Orthopedics;  Laterality: Left;  . JOINT REPLACEMENT     bilateral knee replacement    Social History   Socioeconomic History  . Marital status: Married    Spouse name: Not on file  . Number of children: Not on file  . Years of education: Not on file  . Highest education level: Not  on file  Occupational History  . Not on file  Tobacco Use  . Smoking status: Former Games developer  . Smokeless tobacco: Never Used  Vaping Use  . Vaping Use: Never used  Substance and Sexual Activity  . Alcohol use: Never  . Drug use: Never  . Sexual activity: Not on file  Other Topics Concern  . Not on file  Social History Narrative   Right handed    Lives family husband daughter and granddaughter    Social Determinants of Health   Financial Resource Strain: Not on file  Food Insecurity: Not on file  Transportation Needs: Not on file  Physical Activity: Not on file  Stress: Not on file  Social Connections: Not on file  Intimate Partner Violence: Not on file   Family History  Problem Relation Age of Onset  . Hypertension Mother   . Heart attack Father       VITAL SIGNS BP 133/62   Pulse 68   Temp (!) 97.5 F (36.4 C)   Resp 18   Ht 5\' 2"  (1.575 m)   Wt 155 lb 12.8 oz (70.7 kg)   SpO2 100%   BMI 28.50 kg/m   Outpatient Encounter Medications as of 08/02/2020  Medication Sig  . acetaminophen (TYLENOL) 325 MG tablet Take 2 tablets (650 mg total) by mouth every 6 (six) hours as needed for mild pain or fever.  10/02/2020 aspirin EC 81 MG EC  tablet Take 1 tablet (81 mg total) by mouth 2 (two) times daily. Swallow whole.  Marland Kitchen atorvastatin (LIPITOR) 40 MG tablet atorvastatin 40 mg tablet  TAKE 1 TABLET BY MOUTH IN THE EVENING FOR HIGH CHOLESTEROL  . feeding supplement (ENSURE ENLIVE / ENSURE PLUS) LIQD Take 237 mLs by mouth 2 (two) times daily between meals.  Marland Kitchen icosapent Ethyl (VASCEPA) 1 g capsule Take 1 g by mouth daily in the afternoon.  . insulin glargine (LANTUS SOLOSTAR) 100 UNIT/ML Solostar Pen Inject 11 Units into the skin at bedtime.  . iron polysaccharides (NIFEREX) 150 MG capsule Take 1 capsule (150 mg total) by mouth 2 (two) times daily.  Marland Kitchen levETIRAcetam (KEPPRA) 500 MG tablet TAKE 1 TABLET BY MOUTH TWICE DAILY  . linagliptin (TRADJENTA) 5 MG TABS tablet Take 5 mg by mouth  daily.  Marland Kitchen lisinopril (ZESTRIL) 20 MG tablet Take 20 mg by mouth daily.  . memantine (NAMENDA) 10 MG tablet Take 10 mg by mouth 2 (two) times daily.  . NON FORMULARY Diet NAS, Consistent Carbohydrate  . sertraline (ZOLOFT) 100 MG tablet Take by mouth. Take 100mg  in morning and 50 mg at bedtime   No facility-administered encounter medications on file as of 08/02/2020.     SIGNIFICANT DIAGNOSTIC EXAMS  PREVIOUS   07-11-20: left hip x-ray: Acute mildly comminuted left intertrochanteric fracture   07-11-20: chest x-ray: No active disease. Borderline to mild cardiomegaly.   07-11-20: ct of head and cervical spine:  1. No acute intracranial abnormality. 2. No acute fracture or static subluxation of the cervical spine.  07-12-20: left femur x-ray:  Displaced intertrochanteric fracture LEFT femur. Osseous demineralization with LEFT knee prosthesis noted.  NO NEW EXAMS.   LABS REVIEWED PREVIOUS   07-11-20: wbc 12.2; hgb 11.9 hct 36.6; mcv 97.1 plt 166; glucose 278; bun 24; creat 1.05; k+ 4.1; na++ 137; ca 9.0 GFR 53; ast 185; alt 66; albumin 3.5 07-12-20: wbc 9.4; hgb 11.5; hct 36.0; mcv 97.8 plt 148; glucose 363; bun 27; creat 1.22; k+ 4.0; na++ 137; ca 9.0; GFR 44; mag 1.9 phos 5.0 ast 518; alt 238; albumin 3.5 hgb a1c 10.5 07-13-20: glucose 238; bun 24; creat 0.98; k+ 4.0; na++ 138; ca 8.9 GFR 58 ast 81; alt 130; albumin 3.2 07-14-20: wbc 8.3; hgb 8.1; hct 25.0; mcv 97.7 plt 120; glucose 168; bun 18; creat 0.91; k+ 3.7; na++ 140; ca 8.6 GFR >60 07-16-20: wbc 7.2; hgb 7.2; hct 23.0; mcv 97.9 plt 142;  07-18-20: wbc 7.2; hgb 9.1; hct 27.9; mcv 93.9 plt 156; glucose 168; bun 17; creat 0.72; k+ 3.3; na++ 139; ca 8.1 GFR >60  NO NEW LABS   Review of Systems  Unable to perform ROS: Dementia (unable to participate )   Physical Exam Constitutional:      General: She is not in acute distress.    Appearance: She is well-developed and well-nourished. She is not diaphoretic.  Neck:     Thyroid: No  thyromegaly.  Cardiovascular:     Rate and Rhythm: Normal rate and regular rhythm.     Pulses: Normal pulses and intact distal pulses.     Heart sounds: Murmur heard.      Comments: 2/6 Pulmonary:     Effort: Pulmonary effort is normal. No respiratory distress.     Breath sounds: Normal breath sounds.  Abdominal:     General: Bowel sounds are normal. There is no distension.     Palpations: Abdomen is soft.     Tenderness: There  is no abdominal tenderness.  Musculoskeletal:        General: No edema.     Cervical back: Neck supple.     Right lower leg: No edema.     Left lower leg: No edema.     Comments: Is able to move all extremities 07-13-20: IM nail left hip   Lymphadenopathy:     Cervical: No cervical adenopathy.  Skin:    General: Skin is warm and dry.  Neurological:     Mental Status: She is alert. Mental status is at baseline.  Psychiatric:        Mood and Affect: Mood and affect and mood normal.      ASSESSMENT/ PLAN:  TODAY  1. Aortic atherosclerosis 2. Depression due to dementia 3. Dementia without behavioral disturbance unspecified dementia type 4. Closed comminuted intertrochanteric fracture of left femur sequela  Will continue current medications Will continue current plan of care Will continue therapy as directed  The goal of her care is return home.     Synthia Innocent NP [facility]  Contact [phone] Monday through Friday 8am- 5pm  After hours call [phone]

## 2020-08-16 ENCOUNTER — Non-Acute Institutional Stay (SKILLED_NURSING_FACILITY): Payer: Medicare HMO | Admitting: Adult Health

## 2020-08-16 ENCOUNTER — Encounter: Payer: Self-pay | Admitting: Adult Health

## 2020-08-16 DIAGNOSIS — I152 Hypertension secondary to endocrine disorders: Secondary | ICD-10-CM | POA: Diagnosis not present

## 2020-08-16 DIAGNOSIS — E1169 Type 2 diabetes mellitus with other specified complication: Secondary | ICD-10-CM

## 2020-08-16 DIAGNOSIS — S72142S Displaced intertrochanteric fracture of left femur, sequela: Secondary | ICD-10-CM

## 2020-08-16 DIAGNOSIS — E1159 Type 2 diabetes mellitus with other circulatory complications: Secondary | ICD-10-CM

## 2020-08-16 DIAGNOSIS — E785 Hyperlipidemia, unspecified: Secondary | ICD-10-CM

## 2020-08-16 NOTE — Progress Notes (Signed)
Location:  [facility] Room Number: 132/P Place of Service:  SNF (31)   CODE STATUS: full code   Allergies  Allergen Reactions  . Keppra [Levetiracetam]     Daughter states pt was "out of it" after taking it about 2 or 3 times    Chief Complaint  Patient presents with  . Medical Management of Chronic Issues         Closed comminuted intertrochanteric fracture of left femur sequela;   Hypertension associated with type 2 diabetes mellitus:   Hyperlipidemia associated with type 2 diabetes mellitus     HPI:  She is a 85 year old long term resident of this facility being seen for the management of her chronic illnesses:  Closed comminuted intertrochanteric fracture of left femur sequela;   Hypertension associated with type 2 diabetes mellitus:   Hyperlipidemia associated with type 2 diabetes mellitus. There are no reports of uncontrolled pain; no changes in appetite; no reports of anxiety or insomnia.   Past Medical History:  Diagnosis Date  . Dementia (HCC)   . Syncopal episodes   . Type 2 diabetes mellitus with neurologic complication [facility])     Past Surgical History:  Procedure Laterality Date  . INTRAMEDULLARY (IM) NAIL INTERTROCHANTERIC Left 07/13/2020   Procedure: INTRAMEDULLARY (IM) NAIL INTERTROCHANTRIC;  Surgeon: Oliver Barre, MD;  Location: [facility];  Service: Orthopedics;  Laterality: Left;  . JOINT REPLACEMENT     bilateral knee replacement    Social History   Socioeconomic History  . Marital status: Married    Spouse name: Not on file  . Number of children: Not on file  . Years of education: Not on file  . Highest education level: Not on file  Occupational History  . Not on file  Tobacco Use  . Smoking status: Former Games developer  . Smokeless tobacco: Never Used  Vaping Use  . Vaping Use: Never used  Substance and Sexual Activity  . Alcohol use: Never  . Drug use: Never  . Sexual activity: Not on file  Other Topics Concern  . Not on  file  Social History Narrative   Right handed    Lives family husband daughter and granddaughter    Social Determinants of Health   Financial Resource Strain: Not on file  Food Insecurity: Not on file  Transportation Needs: Not on file  Physical Activity: Not on file  Stress: Not on file  Social Connections: Not on file  Intimate Partner Violence: Not on file   Family History  Problem Relation Age of Onset  . Hypertension Mother   . Heart attack Father       VITAL SIGNS BP 117/80   Pulse 83   Temp 98.3 F (36.8 C)   Resp 20   Ht 5\' 2"  (1.575 m)   Wt 155 lb 12.8 oz (70.7 kg)   SpO2 100%   BMI 28.50 kg/m   Outpatient Encounter Medications as of 08/16/2020  Medication Sig  . acetaminophen (TYLENOL) 325 MG tablet Take 2 tablets (650 mg total) by mouth every 6 (six) hours as needed for mild pain or fever.  08/18/2020 aspirin EC 81 MG EC tablet Take 1 tablet (81 mg total) by mouth 2 (two) times daily. Swallow whole.  Marland Kitchen atorvastatin (LIPITOR) 40 MG tablet atorvastatin 40 mg tablet  TAKE 1 TABLET BY MOUTH IN THE EVENING FOR HIGH CHOLESTEROL  . icosapent Ethyl (VASCEPA) 1 g capsule Take 1 g by mouth daily in the afternoon.  Marland Kitchen  insulin glargine (LANTUS SOLOSTAR) 100 UNIT/ML Solostar Pen Inject 11 Units into the skin at bedtime.  . iron polysaccharides (NIFEREX) 150 MG capsule Take 1 capsule (150 mg total) by mouth 2 (two) times daily.  Marland Kitchen levETIRAcetam (KEPPRA) 500 MG tablet TAKE 1 TABLET BY MOUTH TWICE DAILY  . linagliptin (TRADJENTA) 5 MG TABS tablet Take 5 mg by mouth daily.  Marland Kitchen lisinopril (ZESTRIL) 20 MG tablet Take 20 mg by mouth daily.  . memantine (NAMENDA) 10 MG tablet Take 10 mg by mouth 2 (two) times daily.  . NON FORMULARY Diet NAS, Consistent Carbohydrate  . sertraline (ZOLOFT) 100 MG tablet Take by mouth. Take 100mg  in morning and 50 mg at bedtime  . [DISCONTINUED] feeding supplement (ENSURE ENLIVE / ENSURE PLUS) LIQD Take 237 mLs by mouth 2 (two) times daily between meals.    No facility-administered encounter medications on file as of 08/16/2020.     SIGNIFICANT DIAGNOSTIC EXAMS   PREVIOUS   07-11-20: left hip x-ray: Acute mildly comminuted left intertrochanteric fracture   07-11-20: chest x-ray: No active disease. Borderline to mild cardiomegaly.   07-11-20: ct of head and cervical spine:  1. No acute intracranial abnormality. 2. No acute fracture or static subluxation of the cervical spine.  07-12-20: left femur x-ray:  Displaced intertrochanteric fracture LEFT femur. Osseous demineralization with LEFT knee prosthesis noted.  NO NEW EXAMS.   LABS REVIEWED PREVIOUS   07-11-20: wbc 12.2; hgb 11.9 hct 36.6; mcv 97.1 plt 166; glucose 278; bun 24; creat 1.05; k+ 4.1; na++ 137; ca 9.0 GFR 53; ast 185; alt 66; albumin 3.5 07-12-20: wbc 9.4; hgb 11.5; hct 36.0; mcv 97.8 plt 148; glucose 363; bun 27; creat 1.22; k+ 4.0; na++ 137; ca 9.0; GFR 44; mag 1.9 phos 5.0 ast 518; alt 238; albumin 3.5 hgb a1c 10.5 07-13-20: glucose 238; bun 24; creat 0.98; k+ 4.0; na++ 138; ca 8.9 GFR 58 ast 81; alt 130; albumin 3.2 07-14-20: wbc 8.3; hgb 8.1; hct 25.0; mcv 97.7 plt 120; glucose 168; bun 18; creat 0.91; k+ 3.7; na++ 140; ca 8.6 GFR >60 07-16-20: wbc 7.2; hgb 7.2; hct 23.0; mcv 97.9 plt 142;  07-18-20: wbc 7.2; hgb 9.1; hct 27.9; mcv 93.9 plt 156; glucose 168; bun 17; creat 0.72; k+ 3.3; na++ 139; ca 8.1 GFR >60  NO NEW LABS   Review of Systems  Unable to perform ROS: Dementia (uanble to participate )    Physical Exam Constitutional:      General: She is not in acute distress.    Appearance: She is well-developed. She is not diaphoretic.  Neck:     Thyroid: No thyromegaly.  Cardiovascular:     Rate and Rhythm: Normal rate and regular rhythm.     Pulses: Normal pulses.     Heart sounds: Murmur heard.      Comments: 2/6 Pulmonary:     Effort: Pulmonary effort is normal. No respiratory distress.     Breath sounds: Normal breath sounds.  Abdominal:     General:  Bowel sounds are normal. There is no distension.     Palpations: Abdomen is soft.     Tenderness: There is no abdominal tenderness.  Musculoskeletal:     Cervical back: Neck supple.     Right lower leg: No edema.     Left lower leg: No edema.     Comments: Is able to move all extremities 07-13-20: IM nail left hip    Lymphadenopathy:     Cervical: No cervical adenopathy.  Skin:    General: Skin is warm and dry.  Neurological:     Mental Status: She is alert. Mental status is at baseline.  Psychiatric:        Mood and Affect: Mood normal.       ASSESSMENT/ PLAN:  TODAY  1. Closed comminuted intertrochanteric fracture of left femur sequela; is stable will continue therapy as directed and will follow up with orthopedics as indicated will continue asa 81 mg daily twice daily through 08-28-20.   2. Hypertension associated with type 2 diabetes mellitus: is stable b/p 117/80 will continue lisinopril 20 mg daily   3. Hyperlipidemia associated with type 2 diabetes mellitus: is stable will continue lipitor 40 mg daily and vascepa 1 gm daily   PREVIOUS   4. Dementia without behavioral disturbance unspecified dementia type: is without change weight is 155 pounds; will continue namenda 10 mg twice daily   5. Postoperative anemia due to acute blood loss: hgb is 9.1.  Is status post transfusion in the hospital; will continue niferex 150 mg twice daily   6. Depression due to dementia: is stable will continue zoloft 100 mg in the AM and 50 mg in the PM  7. Transaminitis: her liver enzymes are improving. In the setting of acute phase reaction; dehydration ans statin use.  Will continue to monitor her status  8. Aortic atherosclerosis (ct 09-26-19) will monitor   9. Moderate protein calorie malnutrition: her albumin is 3.2 weight is 154 pounds will continue supplements as directed  10. Diabetes type 2 with peripheral artery disease: hgb a1c 10.5; will continue tradjenta 5 mg daily lantus 11  units nightly  11. Seizures:  Is without recent seizure activity: will continue keppra 500 mg twice daily   12. Dehydration: has resolved after receiving IVF.     Synthia Innocent NP [facility]  Contact [phone] Monday through Friday 8am- 5pm  After hours call [phone]

## 2020-08-23 ENCOUNTER — Ambulatory Visit (INDEPENDENT_AMBULATORY_CARE_PROVIDER_SITE_OTHER): Payer: Medicare HMO | Admitting: Orthopedic Surgery

## 2020-08-23 ENCOUNTER — Inpatient Hospital Stay: Payer: Medicare HMO

## 2020-08-23 ENCOUNTER — Encounter: Payer: Self-pay | Admitting: Orthopedic Surgery

## 2020-08-23 VITALS — Ht 62.0 in

## 2020-08-23 DIAGNOSIS — Z8781 Personal history of (healed) traumatic fracture: Secondary | ICD-10-CM

## 2020-08-23 DIAGNOSIS — G8929 Other chronic pain: Secondary | ICD-10-CM

## 2020-08-23 DIAGNOSIS — M25562 Pain in left knee: Secondary | ICD-10-CM | POA: Diagnosis not present

## 2020-08-23 NOTE — Progress Notes (Signed)
Orthopaedic Postop Note  Assessment: Misty Price is a 85 y.o. female s/p cephalomedullary nail for left intertrochanteric femur fracture; persistent left knee pain s/p L TKA  DOS: 07/13/2020  Plan: Reviewed radiographs with the patient and her family in clinic today.  These demonstrate maintenance of alignment of the left intertrochanteric femur fracture.  On physical exam, she has no pain with manipulation of her hip.  No tenderness along the surgical incisions.  However, she does have diffuse pain around her left knee, with a history of total knee arthroplasty.  There is minimal swelling within her knee, and her incision remains well-healed.  I am not concerned about an infection in her total knee arthroplasty, nor is there evidence of an acute injury on x-rays today.  I have encouraged her to continue with weightbearing as tolerated, as well as range of motion as tolerated.  It is okay for her to push through some pain that she may experience.  She has some issues with dementia, and it is unclear how well she understands what is happening.  Nonetheless, her hip does appear to be healing well.  If she continues to have diffuse pain in her left knee, we may have to refer her for full evaluation.  No changes to her plan at this time.  Follow-up in 6 weeks.  Documentation for the nursing facility was completed.  Follow-up in 6 weeks with repeat x-rays  Follow-up: Return in about 6 weeks (around 10/04/2020). XR at next visit: [facility] pelvis and left hip/femur  Subjective:  Chief Complaint  Patient presents with  . Hip Pain    Left hip,    History of Present Illness: Misty Price is a 85 y.o. female who presents following the above stated procedure.  She continues to stay at the [facility].  She is in clinic today with family, who states that she frequently misses out on physical therapy due to some pain in her left leg.  Her ability to ambulate with assistance has significantly improved.  She  is complaining of pain in her left knee.  She has baseline dementia, and does not remember having surgery on her hip.  Review of Systems: No fevers or chills No numbness or tingling No Chest Pain No shortness of breath   Objective: Ht 5\' 2"  (1.575 m)   BMI 28.50 kg/m   Physical Exam:  Elderly female, seated in wheelchair.  No acute distress.  Evaluation of left hip demonstrates well-healing surgical incisions without surrounding erythema or drainage.  She tolerates gentle range of motion of her hip.  No pain with axial loading.  She has diffuse tenderness about the left knee.  She has an anterior based surgical incision that is well-healed.  There is no swelling around her left knee.  The knee itself is not red and warm.  Sensation is intact distally.  Active dorsiflexion of the ankle and the great toe.  Toes are warm and well perfused.  IMAGING: I personally ordered and reviewed the following images:   X-ray of the left hip demonstrates a healing, displaced intertrochanteric femur fracture.  Hardware remains in a good position.  No evidence of failure or subsidence.  There is callus formation about the fracture site.  Impression: Stable left hip following operative fixation of intertrochanteric femur fracture.  X-ray of the left knee was obtained in clinic today and demonstrates a well-positioned total knee arthroplasty.  There is no evidence of loosening.  There does not appear to be an acute injury  surrounding the prosthesis.  There is no bony destruction.  Impression: Left total knee arthroplasty in good position.  Oliver Barre, MD 08/23/2020 10:26 PM

## 2020-08-29 ENCOUNTER — Other Ambulatory Visit: Payer: Self-pay

## 2020-08-29 ENCOUNTER — Emergency Department (HOSPITAL_COMMUNITY): Payer: Medicare HMO

## 2020-08-29 ENCOUNTER — Emergency Department (HOSPITAL_COMMUNITY)
Admission: EM | Admit: 2020-08-29 | Discharge: 2020-08-29 | Disposition: A | Discharge status: Home / Self Care | Payer: Medicare HMO | Attending: Emergency Medicine | Admitting: Emergency Medicine

## 2020-08-29 ENCOUNTER — Encounter (HOSPITAL_COMMUNITY): Payer: Self-pay

## 2020-08-29 ENCOUNTER — Inpatient Hospital Stay
Admission: RE | Admit: 2020-08-29 | Discharge: 2020-09-06 | Disposition: A | Discharge status: Home / Self Care | Payer: Medicare HMO | Source: Ambulatory Visit | Attending: Internal Medicine | Admitting: Internal Medicine

## 2020-08-29 DIAGNOSIS — E119 Type 2 diabetes mellitus without complications: Secondary | ICD-10-CM | POA: Insufficient documentation

## 2020-08-29 DIAGNOSIS — Z794 Long term (current) use of insulin: Secondary | ICD-10-CM | POA: Diagnosis not present

## 2020-08-29 DIAGNOSIS — F0390 Unspecified dementia without behavioral disturbance: Secondary | ICD-10-CM | POA: Insufficient documentation

## 2020-08-29 DIAGNOSIS — R404 Transient alteration of awareness: Secondary | ICD-10-CM | POA: Diagnosis not present

## 2020-08-29 DIAGNOSIS — Z79899 Other long term (current) drug therapy: Secondary | ICD-10-CM | POA: Insufficient documentation

## 2020-08-29 DIAGNOSIS — Z96651 Presence of right artificial knee joint: Secondary | ICD-10-CM | POA: Diagnosis not present

## 2020-08-29 DIAGNOSIS — Z87891 Personal history of nicotine dependence: Secondary | ICD-10-CM | POA: Diagnosis not present

## 2020-08-29 DIAGNOSIS — Z20822 Contact with and (suspected) exposure to covid-19: Secondary | ICD-10-CM | POA: Insufficient documentation

## 2020-08-29 DIAGNOSIS — S0083XA Contusion of other part of head, initial encounter: Secondary | ICD-10-CM | POA: Insufficient documentation

## 2020-08-29 DIAGNOSIS — M25552 Pain in left hip: Secondary | ICD-10-CM | POA: Insufficient documentation

## 2020-08-29 DIAGNOSIS — Z96653 Presence of artificial knee joint, bilateral: Secondary | ICD-10-CM | POA: Insufficient documentation

## 2020-08-29 DIAGNOSIS — I10 Essential (primary) hypertension: Secondary | ICD-10-CM | POA: Diagnosis not present

## 2020-08-29 DIAGNOSIS — Z043 Encounter for examination and observation following other accident: Secondary | ICD-10-CM | POA: Diagnosis not present

## 2020-08-29 DIAGNOSIS — W19XXXA Unspecified fall, initial encounter: Secondary | ICD-10-CM | POA: Insufficient documentation

## 2020-08-29 DIAGNOSIS — S72002A Fracture of unspecified part of neck of left femur, initial encounter for closed fracture: Secondary | ICD-10-CM | POA: Diagnosis not present

## 2020-08-29 DIAGNOSIS — S0003XA Contusion of scalp, initial encounter: Secondary | ICD-10-CM | POA: Diagnosis not present

## 2020-08-29 DIAGNOSIS — T07XXXA Unspecified multiple injuries, initial encounter: Secondary | ICD-10-CM

## 2020-08-29 DIAGNOSIS — Y92002 Bathroom of unspecified non-institutional (private) residence single-family (private) house as the place of occurrence of the external cause: Secondary | ICD-10-CM | POA: Insufficient documentation

## 2020-08-29 DIAGNOSIS — Z96652 Presence of left artificial knee joint: Secondary | ICD-10-CM | POA: Diagnosis not present

## 2020-08-29 DIAGNOSIS — Z7984 Long term (current) use of oral hypoglycemic drugs: Secondary | ICD-10-CM | POA: Insufficient documentation

## 2020-08-29 DIAGNOSIS — S8001XA Contusion of right knee, initial encounter: Secondary | ICD-10-CM | POA: Diagnosis not present

## 2020-08-29 DIAGNOSIS — Z7982 Long term (current) use of aspirin: Secondary | ICD-10-CM | POA: Diagnosis not present

## 2020-08-29 DIAGNOSIS — S0990XA Unspecified injury of head, initial encounter: Secondary | ICD-10-CM | POA: Diagnosis present

## 2020-08-29 DIAGNOSIS — S8002XA Contusion of left knee, initial encounter: Secondary | ICD-10-CM | POA: Diagnosis not present

## 2020-08-29 HISTORY — DX: Displaced intertrochanteric fracture of left femur, initial encounter for closed fracture: S72.142A

## 2020-08-29 HISTORY — DX: Unsteadiness on feet: R26.81

## 2020-08-29 HISTORY — DX: Depression, unspecified: F32.A

## 2020-08-29 HISTORY — DX: Muscle weakness (generalized): M62.81

## 2020-08-29 HISTORY — DX: Hyperlipidemia, unspecified: E78.5

## 2020-08-29 HISTORY — DX: Essential (primary) hypertension: I10

## 2020-08-29 HISTORY — DX: Moderate protein-calorie malnutrition: E44.0

## 2020-08-29 HISTORY — DX: History of falling: Z91.81

## 2020-08-29 HISTORY — DX: Cognitive communication deficit: R41.841

## 2020-08-29 HISTORY — DX: Unspecified atherosclerosis: I70.90

## 2020-08-29 LAB — COMPREHENSIVE METABOLIC PANEL
ALT: 20 U/L (ref 0–44)
AST: 25 U/L (ref 15–41)
Albumin: 4 g/dL (ref 3.5–5.0)
Alkaline Phosphatase: 80 U/L (ref 38–126)
Anion gap: 12 (ref 5–15)
BUN: 30 mg/dL — ABNORMAL HIGH (ref 8–23)
CO2: 24 mmol/L (ref 22–32)
Calcium: 9.1 mg/dL (ref 8.9–10.3)
Chloride: 103 mmol/L (ref 98–111)
Creatinine, Ser: 1.27 mg/dL — ABNORMAL HIGH (ref 0.44–1.00)
GFR, Estimated: 42 mL/min — ABNORMAL LOW (ref >60)
Glucose, Bld: 173 mg/dL — ABNORMAL HIGH (ref 70–99)
Potassium: 4.3 mmol/L (ref 3.5–5.1)
Sodium: 139 mmol/L (ref 135–145)
Total Bilirubin: 0.5 mg/dL (ref 0.3–1.2)
Total Protein: 7 g/dL (ref 6.5–8.1)

## 2020-08-29 LAB — CBC WITH DIFFERENTIAL/PLATELET
Abs Immature Granulocytes: 0.03 10*3/uL (ref 0.00–0.07)
Basophils Absolute: 0 10*3/uL (ref 0.0–0.1)
Basophils Relative: 0 %
Eosinophils Absolute: 0.2 10*3/uL (ref 0.0–0.5)
Eosinophils Relative: 3 %
HCT: 37.9 % (ref 36.0–46.0)
Hemoglobin: 12.1 g/dL (ref 12.0–15.0)
Immature Granulocytes: 1 %
Lymphocytes Relative: 29 %
Lymphs Abs: 1.8 10*3/uL (ref 0.7–4.0)
MCH: 31.6 pg (ref 26.0–34.0)
MCHC: 31.9 g/dL (ref 30.0–36.0)
MCV: 99 fL (ref 80.0–100.0)
Monocytes Absolute: 0.7 10*3/uL (ref 0.1–1.0)
Monocytes Relative: 11 %
Neutro Abs: 3.6 10*3/uL (ref 1.7–7.7)
Neutrophils Relative %: 56 %
Platelets: 175 10*3/uL (ref 150–400)
RBC: 3.83 MIL/uL — ABNORMAL LOW (ref 3.87–5.11)
RDW: 14 % (ref 11.5–15.5)
WBC: 6.3 10*3/uL (ref 4.0–10.5)
nRBC: 0 % (ref 0.0–0.2)

## 2020-08-29 LAB — RESP PANEL BY RT-PCR (FLU A&B, COVID) ARPGX2
Influenza A by PCR: NEGATIVE
Influenza B by PCR: NEGATIVE
SARS Coronavirus 2 by RT PCR: NEGATIVE

## 2020-08-29 MED ORDER — FENTANYL CITRATE (PF) 100 MCG/2ML IJ SOLN
50.0000 ug | Freq: Once | INTRAMUSCULAR | Status: AC
  Administered 2020-08-29: 50 ug via INTRAVENOUS
  Filled 2020-08-29: qty 2

## 2020-08-29 MED ORDER — FENTANYL CITRATE (PF) 100 MCG/2ML IJ SOLN
100.0000 ug | Freq: Once | INTRAMUSCULAR | Status: AC
Start: 2020-08-29 — End: 2020-08-29
  Administered 2020-08-29: 100 ug via INTRAVENOUS
  Filled 2020-08-29: qty 2

## 2020-08-29 NOTE — Discharge Instructions (Signed)
No injuries were discovered after the fall today.  Continue your current treatments.

## 2020-08-29 NOTE — ED Notes (Signed)
Emmanuel requests staff to meet him with pt in the tunnel, advised him that we currently don't have anyone to meet at this time but will check with Charge RN to see if this can be arranged shortly.

## 2020-08-29 NOTE — ED Notes (Signed)
Pt unable to sign MSE acknowledgement due to dementia

## 2020-08-29 NOTE — ED Triage Notes (Signed)
Pt presents to ED via [facility] for unwitnessed fall at the [facility], unsure LOC. Pt with left leg shortening, hematoma to right side of forehead, bruising to right knee.

## 2020-08-29 NOTE — ED Provider Notes (Signed)
[facility] EMERGENCY DEPARTMENT Provider Note   CSN: [id number] Arrival date & time: 08/29/20  1807     History Chief Complaint  Patient presents with  . Fall    Misty Price is a 85 y.o. female.  HPI Patient is living in a rehab facility, following recent femur rod placement about a month ago after traumatic fracture.  She was in the bathroom today, and fell.  This fall was unwitnessed.  She presents here for evaluation of a contusion of her head, left hip pain and right knee pain.  No reported loss of consciousness.  She is unable to give any history.  Daughter arrived to the ED and states that she talked to caregivers at the nursing home, who gave her the above history.  Per the daughter, the patient is currently at her baseline mental status  Level 5 caveat-confusion    Past Medical History:  Diagnosis Date  . Atherosclerosis   . Cognitive communication deficit   . Dementia (HCC)   . Depression   . Displaced intertrochanteric fracture of left femur (HCC)   . History of falling   . Hyperlipidemia   . Hypertension   . Moderate protein-calorie malnutrition (HCC)   . Muscle weakness   . Syncopal episodes   . 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 Unsteadiness on feet     Patient Active Problem List   Diagnosis Date Noted  . Hyperlipidemia associated with type 2 diabetes mellitus (HCC) 07/19/2020  . 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 Aortic atherosclerosis (HCC) 07/19/2020  . Protein-calorie malnutrition (HCC) 07/19/2020  . Hyperlipidemia   . Postoperative anemia due to acute blood loss   . Closed comminuted intertrochanteric fracture of proximal end of left femur (HCC) 07/12/2020  . Leukocytosis 07/12/2020  . Hyperglycemia due to diabetes mellitus (HCC) 07/12/2020  . Transaminitis 07/12/2020  . Diabetes mellitus type 2 with peripheral artery disease (HCC) 09/27/2019  . Hypertension associated with type 2 diabetes mellitus  (HCC) 09/27/2019  . Fall 09/26/2019  . Dementia (HCC) 09/26/2019  . Syncope 09/26/2019    Past Surgical History:  Procedure Laterality Date  . INTRAMEDULLARY (IM) NAIL INTERTROCHANTERIC Left 07/13/2020   Procedure: INTRAMEDULLARY (IM) NAIL INTERTROCHANTRIC;  Surgeon: Oliver Barre, MD;  Location: [facility];  Service: Orthopedics;  Laterality: Left;  . JOINT REPLACEMENT     bilateral knee replacement     OB History   No obstetric history on file.     Family History  Problem Relation Age of Onset  . Hypertension Mother   . Heart attack Father     Social History   Tobacco Use  . Smoking status: Former Games developer  . Smokeless tobacco: Never Used  Vaping Use  . Vaping Use: Never used  Substance Use Topics  . Alcohol use: Never  . Drug use: Never    Home Medications Prior to Admission medications   Medication Sig Start Date End Date Taking? Authorizing Provider  acetaminophen (TYLENOL) 325 MG tablet Take 2 tablets (650 mg total) by mouth every 6 (six) hours as needed for mild pain or fever. 07/18/20   Vassie Loll, MD  aspirin EC 81 MG EC tablet Take 1 tablet (81 mg total) by mouth 2 (two) times daily. Swallow whole. 07/18/20   Vassie Loll, MD  atorvastatin (LIPITOR) 40 MG tablet atorvastatin 40 mg tablet  TAKE 1 TABLET BY MOUTH IN THE EVENING FOR HIGH CHOLESTEROL    [provider]  icosapent  Ethyl (VASCEPA) 1 g capsule Take 1 g by mouth daily in the afternoon. 07/18/20   [provider]  insulin glargine (LANTUS SOLOSTAR) 100 UNIT/ML Solostar Pen Inject 11 Units into the skin at bedtime. 07/18/20   Vassie Loll, MD  iron polysaccharides (NIFEREX) 150 MG capsule Take 1 capsule (150 mg total) by mouth 2 (two) times daily. 07/18/20   Vassie Loll, MD  levETIRAcetam (KEPPRA) 500 MG tablet TAKE 1 TABLET BY MOUTH TWICE DAILY 07/10/20   Van Clines, MD  linagliptin (TRADJENTA) 5 MG TABS tablet Take 5 mg by mouth daily.    [provider]  lisinopril  (ZESTRIL) 20 MG tablet Take 20 mg by mouth daily.    [provider]  memantine (NAMENDA) 10 MG tablet Take 10 mg by mouth 2 (two) times daily. 04/06/20   [provider]  NON FORMULARY Diet NAS, Consistent Carbohydrate 07/18/20   [provider]  sertraline (ZOLOFT) 100 MG tablet Take by mouth. Take 100mg  in morning and 50 mg at bedtime    [provider]    Allergies    Keppra [levetiracetam]  Review of Systems   Review of Systems  All other systems reviewed and are negative.   Physical Exam Updated Vital Signs Ht 5\' 2"  (1.575 m)   Wt 70.5 kg   BMI 28.44 kg/m   Physical Exam Vitals and nursing note reviewed.  Constitutional:      General: She is in acute distress (Uncomfortable).     Appearance: She is well-developed. She is not ill-appearing, toxic-appearing or diaphoretic.  HENT:     Head: Normocephalic.     Comments: Contusion left forehead without bleeding or drainage    Right Ear: External ear normal.     Left Ear: External ear normal.  Eyes:     Conjunctiva/sclera: Conjunctivae normal.     Pupils: Pupils are equal, round, and reactive to light.  Neck:     Trachea: Phonation normal.  Cardiovascular:     Rate and Rhythm: Normal rate and regular rhythm.     Heart sounds: Normal heart sounds.  Pulmonary:     Effort: Pulmonary effort is normal.     Breath sounds: Normal breath sounds.  Abdominal:     General: There is no distension.     Palpations: Abdomen is soft.     Tenderness: There is no abdominal tenderness.  Musculoskeletal:     Cervical back: Normal range of motion and neck supple.     Comments: She guards against moving the left upper leg secondary to pain.  Mild tenderness right anterior knee with bruising present.  Some bruising present on the left anterior knee.  Normal range of motion of the arms  Skin:    General: Skin is warm and dry.  Neurological:     Mental Status: She is alert.     Cranial Nerves: No cranial  nerve deficit.     Motor: No abnormal muscle tone.     Coordination: Coordination normal.  Psychiatric:        Mood and Affect: Mood normal.        Behavior: Behavior normal.     ED Results / Procedures / Treatments   Labs (all labs ordered are listed, but only abnormal results are displayed) Labs Reviewed - No data to display  EKG None  Radiology No results found.  Procedures Procedures   Medications Ordered in ED Medications - No data to display  ED Course  I have  reviewed the triage vital signs and the nursing notes.  Pertinent labs & imaging results that were available during my care of the patient were reviewed by me and considered in my medical decision making (see chart for details).    MDM Rules/Calculators/A&P                           Patient Vitals for the past 24 hrs:  Height Weight  08/29/20 1817 5\' 2"  (1.575 m) 70.5 kg    10:36 PM Reevaluation with update and discussion. After initial assessment and treatment, an updated evaluation reveals patient remains alert and is fairly comfortable.  Her daughter is in the room I discussed the findings and plan.  Daughter agreeable.  Patient going back to her skilled nursing facility/rehab.   Medical Decision Making:  This patient is presenting for evaluation of injuries from fall, which does require a range of treatment options, and is a complaint that involves a moderate risk of morbidity and mortality. The differential diagnoses include contusion, fracture, disruption of prior hardware placement. I decided to review old records, and in summary elderly female, residing in a nursing care facility, fell while in the bathroom, unobserved.  I obtained additional historical information from daughter at bedside.  Clinical Laboratory Tests Ordered, included CBC and Metabolic panel. Review indicates normal except glucose high, BUN high creatinine high.  Screening viral panel normal. Radiologic Tests  Ordered, included CT head and cervical spine, radiographs of pelvis, left hip and both knees.  I independently Visualized: Radiographic images, which show no fracture.  Stable hardware left hip   Critical Interventions-clinical evaluation, laboratory testing, radiography, observation reassessment  After These Interventions, the Patient was reevaluated and was found stable for discharge.  Patient with fall without serious injury.  She is recovering from left femur fracture, imaging is reassuring.  No indication for hospitalization at this time.  CRITICAL CARE-no Performed by: Mancel Bale  Nursing Notes Reviewed/ Care Coordinated Applicable Imaging Reviewed Interpretation of Laboratory Data incorporated into ED treatment  The patient appears reasonably screened and/or stabilized for discharge and I doubt any other medical condition or other [facility] requiring further screening, evaluation, or treatment in the ED at this time prior to discharge.  Plan: Home Medications-continue usual medications; Home Treatments-rest, up with assistance; return here if the recommended treatment, does not improve the symptoms; Recommended follow up-PCP, as needed     Final Clinical Impression(s) / ED Diagnoses Final diagnoses:  Fall, initial encounter  Contusion, multiple sites    Rx / DC Orders ED Discharge Orders    None       06-22-1968, MD 08/30/20 0004

## 2020-09-05 ENCOUNTER — Other Ambulatory Visit: Payer: Self-pay | Admitting: Adult Health

## 2020-09-05 ENCOUNTER — Non-Acute Institutional Stay (SKILLED_NURSING_FACILITY): Payer: Medicare HMO | Admitting: Adult Health

## 2020-09-05 ENCOUNTER — Encounter: Payer: Self-pay | Admitting: Adult Health

## 2020-09-05 DIAGNOSIS — E1151 Type 2 diabetes mellitus with diabetic peripheral angiopathy without gangrene: Secondary | ICD-10-CM | POA: Diagnosis not present

## 2020-09-05 DIAGNOSIS — E440 Moderate protein-calorie malnutrition: Secondary | ICD-10-CM

## 2020-09-05 DIAGNOSIS — S72142D Displaced intertrochanteric fracture of left femur, subsequent encounter for closed fracture with routine healing: Secondary | ICD-10-CM

## 2020-09-05 DIAGNOSIS — S72142S Displaced intertrochanteric fracture of left femur, sequela: Secondary | ICD-10-CM | POA: Diagnosis not present

## 2020-09-05 DIAGNOSIS — I700 Atherosclerosis of aorta: Secondary | ICD-10-CM

## 2020-09-05 MED ORDER — ICOSAPENT ETHYL 1 G PO CAPS: 1.0000 g | ORAL_CAPSULE | Freq: Every day | ORAL | 0 refills | Status: DC

## 2020-09-05 MED ORDER — LANTUS SOLOSTAR 100 UNIT/ML ~~LOC~~ SOPN: 11.0000 [IU] | PEN_INJECTOR | Freq: Every day | SUBCUTANEOUS | 0 refills | Status: DC

## 2020-09-05 MED ORDER — ATORVASTATIN CALCIUM 40 MG PO TABS: ORAL_TABLET | ORAL | 0 refills | Status: AC

## 2020-09-05 MED ORDER — LINAGLIPTIN 5 MG PO TABS: 5.0000 mg | ORAL_TABLET | Freq: Every day | ORAL | 0 refills | Status: AC

## 2020-09-05 MED ORDER — POLYSACCHARIDE IRON COMPLEX 150 MG PO CAPS: 150.0000 mg | ORAL_CAPSULE | Freq: Two times a day (BID) | ORAL | 0 refills | Status: DC

## 2020-09-05 MED ORDER — LISINOPRIL 20 MG PO TABS: 20.0000 mg | ORAL_TABLET | Freq: Every day | ORAL | 0 refills | Status: DC

## 2020-09-05 MED ORDER — MEMANTINE HCL 10 MG PO TABS: 10.0000 mg | ORAL_TABLET | Freq: Two times a day (BID) | ORAL | 0 refills | Status: AC

## 2020-09-05 MED ORDER — SERTRALINE HCL 100 MG PO TABS: 50.0000 mg | ORAL_TABLET | Freq: Two times a day (BID) | ORAL | 0 refills | Status: DC

## 2020-09-05 MED ORDER — LEVETIRACETAM 500 MG PO TABS: ORAL_TABLET | ORAL | 0 refills | Status: DC

## 2020-09-05 NOTE — Progress Notes (Signed)
Location:   [facility]  Nursing Home Room Number: 222 Place of Service:  SNF (31)    CODE STATUS: full code  Allergies  Allergen Reactions  . Keppra [Levetiracetam]     Daughter states pt was "out of it" after taking it about 2 or 3 times    Chief Complaint  Patient presents with  . Discharge Note    HPI:  She is being discharged to home with family with home health for pt/ot. She will need a wheelchair and bedside commode. She will need her prescriptions written and will need to follow up with her medical provider. She suffered a left hip fracture with IM nailing. She was admitted to this facility for short term rehab; she has participated in pt/ot. She is back to her baseline. She is ready to continue therapy on a home health basis.    Past Medical History:  Diagnosis Date  . Atherosclerosis   . Cognitive communication deficit   . Dementia (HCC)   . Depression   . Displaced intertrochanteric fracture of left femur (HCC)   . History of falling   . Hyperlipidemia   . Hypertension   . Moderate protein-calorie malnutrition (HCC)   . Muscle weakness   . Syncopal episodes   . 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 Unsteadiness on feet     Past Surgical History:  Procedure Laterality Date  . INTRAMEDULLARY (IM) NAIL INTERTROCHANTERIC Left 07/13/2020   Procedure: INTRAMEDULLARY (IM) NAIL INTERTROCHANTRIC;  Surgeon: Oliver Barre, MD;  Location: [facility];  Service: Orthopedics;  Laterality: Left;  . JOINT REPLACEMENT     bilateral knee replacement    Social History   Socioeconomic History  . Marital status: Married    Spouse name: Not on file  . Number of children: Not on file  . Years of education: Not on file  . Highest education level: Not on file  Occupational History  . Not on file  Tobacco Use  . Smoking status: Former Games developer  . Smokeless tobacco: Never Used  Vaping Use  . Vaping Use: Never used  Substance and Sexual  Activity  . Alcohol use: Never  . Drug use: Never  . Sexual activity: Not on file  Other Topics Concern  . Not on file  Social History Narrative   Right handed    Lives family husband daughter and granddaughter    Social Determinants of Health   Financial Resource Strain: Not on file  Food Insecurity: Not on file  Transportation Needs: Not on file  Physical Activity: Not on file  Stress: Not on file  Social Connections: Not on file  Intimate Partner Violence: Not on file   Family History  Problem Relation Age of Onset  . Hypertension Mother   . Heart attack Father     VITAL SIGNS BP 119/78   Pulse 70   Temp 98.1 F (36.7 C)   Resp 18   Ht 5\' 2"  (1.575 m)   Wt 155 lb (70.3 kg)   BMI 28.35 kg/m   Patient's Medications  New Prescriptions   No medications on file  Previous Medications   ACETAMINOPHEN (TYLENOL) 325 MG TABLET    Take 2 tablets (650 mg total) by mouth every 6 (six) hours as needed for mild pain or fever.   ASPIRIN EC 81 MG EC TABLET    Take 1 tablet (81 mg total) by mouth 2 (two) times daily. Swallow whole.  ATORVASTATIN (LIPITOR) 40 MG TABLET    atorvastatin 40 mg tablet  TAKE 1 TABLET BY MOUTH IN THE EVENING FOR HIGH CHOLESTEROL   ICOSAPENT ETHYL (VASCEPA) 1 G CAPSULE    Take 1 g by mouth daily in the afternoon.   INSULIN GLARGINE (LANTUS SOLOSTAR) 100 UNIT/ML SOLOSTAR PEN    Inject 11 Units into the skin at bedtime.   IRON POLYSACCHARIDES (NIFEREX) 150 MG CAPSULE    Take 1 capsule (150 mg total) by mouth 2 (two) times daily.   LEVETIRACETAM (KEPPRA) 500 MG TABLET    TAKE 1 TABLET BY MOUTH TWICE DAILY   LINAGLIPTIN (TRADJENTA) 5 MG TABS TABLET    Take 5 mg by mouth daily.   LISINOPRIL (ZESTRIL) 20 MG TABLET    Take 20 mg by mouth daily.   MEMANTINE (NAMENDA) 10 MG TABLET    Take 10 mg by mouth 2 (two) times daily.   NON FORMULARY    Diet NAS, Consistent Carbohydrate   SERTRALINE (ZOLOFT) 100 MG TABLET    Take by mouth. Take 100mg  in morning and 50 mg  at bedtime  Modified Medications   No medications on file  Discontinued Medications   No medications on file     SIGNIFICANT DIAGNOSTIC EXAMS   PREVIOUS   07-11-20: left hip x-ray: Acute mildly comminuted left intertrochanteric fracture   07-11-20: chest x-ray: No active disease. Borderline to mild cardiomegaly.   07-11-20: ct of head and cervical spine:  1. No acute intracranial abnormality. 2. No acute fracture or static subluxation of the cervical spine.  07-12-20: left femur x-ray:  Displaced intertrochanteric fracture LEFT femur. Osseous demineralization with LEFT knee prosthesis noted.  NO NEW EXAMS.   LABS REVIEWED PREVIOUS   07-11-20: wbc 12.2; hgb 11.9 hct 36.6; mcv 97.1 plt 166; glucose 278; bun 24; creat 1.05; k+ 4.1; na++ 137; ca 9.0 GFR 53; ast 185; alt 66; albumin 3.5 07-12-20: wbc 9.4; hgb 11.5; hct 36.0; mcv 97.8 plt 148; glucose 363; bun 27; creat 1.22; k+ 4.0; na++ 137; ca 9.0; GFR 44; mag 1.9 phos 5.0 ast 518; alt 238; albumin 3.5 hgb a1c 10.5 07-13-20: glucose 238; bun 24; creat 0.98; k+ 4.0; na++ 138; ca 8.9 GFR 58 ast 81; alt 130; albumin 3.2 07-14-20: wbc 8.3; hgb 8.1; hct 25.0; mcv 97.7 plt 120; glucose 168; bun 18; creat 0.91; k+ 3.7; na++ 140; ca 8.6 GFR >60 07-16-20: wbc 7.2; hgb 7.2; hct 23.0; mcv 97.9 plt 142;  07-18-20: wbc 7.2; hgb 9.1; hct 27.9; mcv 93.9 plt 156; glucose 168; bun 17; creat 0.72; k+ 3.3; na++ 139; ca 8.1 GFR >60  NO NEW LABS    Review of Systems  Unable to perform ROS: Dementia (unable to participate )    Physical Exam Constitutional:      General: She is not in acute distress.    Appearance: She is well-developed. She is not diaphoretic.  Neck:     Thyroid: No thyromegaly.  Cardiovascular:     Rate and Rhythm: Normal rate and regular rhythm.     Pulses: Normal pulses.     Heart sounds: Murmur heard.      Comments: 2/6 Pulmonary:     Effort: Pulmonary effort is normal. No respiratory distress.     Breath sounds: Normal  breath sounds.  Abdominal:     General: Bowel sounds are normal. There is no distension.     Palpations: Abdomen is soft.     Tenderness: There is no abdominal tenderness.  Musculoskeletal:     Cervical back: Neck supple.     Right lower leg: No edema.     Left lower leg: No edema.     Comments: Is able to move all extremities 07-13-20: IM nail left hip     Lymphadenopathy:     Cervical: No cervical adenopathy.  Skin:    General: Skin is warm and dry.  Neurological:     Mental Status: She is alert. Mental status is at baseline.  Psychiatric:        Mood and Affect: Mood normal.       ASSESSMENT/ PLAN:   Patient is being discharged with the following home health services:  Pt/ot to evaluate and treat as indicated for gait balance strength adl training.   Patient is being discharged with the following durable medical equipment:  Bedside commode; wheelchair with cushions; anti-tippers brake extensions to allow her to maintain her current level of independence with her adls which cannot be achieved with a cane crutches walker. She is able to propel wheelchair independently.   Patient has been advised to f/u with their PCP in 1-2 weeks to bring them up to date on their rehab stay.  Social services at facility was responsible for arranging this appointment.  Pt was provided with a 30 day supply of prescriptions for medications and refills must be obtained from their PCP.  For controlled substances, a more limited supply may be provided adequate until PCP appointment only.  A 30 day supply of her prescription medications have been sent to [facility] [address]  Time spent with patient 40 minutes: medications; dme; home health needed    Synthia Innocent NP [facility]  Contact [phone] Monday through Friday 8am- 5pm  After hours call [phone]

## 2020-09-12 DIAGNOSIS — I119 Hypertensive heart disease without heart failure: Secondary | ICD-10-CM | POA: Diagnosis not present

## 2020-09-12 DIAGNOSIS — E440 Moderate protein-calorie malnutrition: Secondary | ICD-10-CM | POA: Diagnosis not present

## 2020-09-12 DIAGNOSIS — D62 Acute posthemorrhagic anemia: Secondary | ICD-10-CM | POA: Diagnosis not present

## 2020-09-12 DIAGNOSIS — F0280 Dementia in other diseases classified elsewhere without behavioral disturbance: Secondary | ICD-10-CM | POA: Diagnosis not present

## 2020-09-12 DIAGNOSIS — S72142D Displaced intertrochanteric fracture of left femur, subsequent encounter for closed fracture with routine healing: Secondary | ICD-10-CM | POA: Diagnosis not present

## 2020-09-12 DIAGNOSIS — E785 Hyperlipidemia, unspecified: Secondary | ICD-10-CM | POA: Diagnosis not present

## 2020-09-12 DIAGNOSIS — E538 Deficiency of other specified B group vitamins: Secondary | ICD-10-CM | POA: Diagnosis not present

## 2020-09-12 DIAGNOSIS — S72142A Displaced intertrochanteric fracture of left femur, initial encounter for closed fracture: Secondary | ICD-10-CM | POA: Diagnosis not present

## 2020-09-12 DIAGNOSIS — F32A Depression, unspecified: Secondary | ICD-10-CM | POA: Diagnosis not present

## 2020-09-12 DIAGNOSIS — E1151 Type 2 diabetes mellitus with diabetic peripheral angiopathy without gangrene: Secondary | ICD-10-CM | POA: Diagnosis not present

## 2020-09-13 DIAGNOSIS — N1831 Chronic kidney disease, stage 3a: Secondary | ICD-10-CM | POA: Diagnosis not present

## 2020-09-13 DIAGNOSIS — E782 Mixed hyperlipidemia: Secondary | ICD-10-CM | POA: Diagnosis not present

## 2020-09-13 DIAGNOSIS — I10 Essential (primary) hypertension: Secondary | ICD-10-CM | POA: Diagnosis not present

## 2020-09-13 DIAGNOSIS — I25119 Atherosclerotic heart disease of native coronary artery with unspecified angina pectoris: Secondary | ICD-10-CM | POA: Diagnosis not present

## 2020-09-13 DIAGNOSIS — F0390 Unspecified dementia without behavioral disturbance: Secondary | ICD-10-CM | POA: Diagnosis not present

## 2020-09-13 DIAGNOSIS — E1165 Type 2 diabetes mellitus with hyperglycemia: Secondary | ICD-10-CM | POA: Diagnosis not present

## 2020-09-13 DIAGNOSIS — S728X2D Other fracture of left femur, subsequent encounter for closed fracture with routine healing: Secondary | ICD-10-CM | POA: Diagnosis not present

## 2020-09-13 DIAGNOSIS — F330 Major depressive disorder, recurrent, mild: Secondary | ICD-10-CM | POA: Diagnosis not present

## 2020-09-13 DIAGNOSIS — D519 Vitamin B12 deficiency anemia, unspecified: Secondary | ICD-10-CM | POA: Diagnosis not present

## 2020-09-14 DIAGNOSIS — F32A Depression, unspecified: Secondary | ICD-10-CM | POA: Diagnosis not present

## 2020-09-14 DIAGNOSIS — E538 Deficiency of other specified B group vitamins: Secondary | ICD-10-CM | POA: Diagnosis not present

## 2020-09-14 DIAGNOSIS — E1151 Type 2 diabetes mellitus with diabetic peripheral angiopathy without gangrene: Secondary | ICD-10-CM | POA: Diagnosis not present

## 2020-09-14 DIAGNOSIS — E440 Moderate protein-calorie malnutrition: Secondary | ICD-10-CM | POA: Diagnosis not present

## 2020-09-14 DIAGNOSIS — E785 Hyperlipidemia, unspecified: Secondary | ICD-10-CM | POA: Diagnosis not present

## 2020-09-14 DIAGNOSIS — F0280 Dementia in other diseases classified elsewhere without behavioral disturbance: Secondary | ICD-10-CM | POA: Diagnosis not present

## 2020-09-14 DIAGNOSIS — I119 Hypertensive heart disease without heart failure: Secondary | ICD-10-CM | POA: Diagnosis not present

## 2020-09-14 DIAGNOSIS — S72142D Displaced intertrochanteric fracture of left femur, subsequent encounter for closed fracture with routine healing: Secondary | ICD-10-CM | POA: Diagnosis not present

## 2020-09-14 DIAGNOSIS — D62 Acute posthemorrhagic anemia: Secondary | ICD-10-CM | POA: Diagnosis not present

## 2020-09-19 DIAGNOSIS — S72142D Displaced intertrochanteric fracture of left femur, subsequent encounter for closed fracture with routine healing: Secondary | ICD-10-CM | POA: Diagnosis not present

## 2020-09-19 DIAGNOSIS — E538 Deficiency of other specified B group vitamins: Secondary | ICD-10-CM | POA: Diagnosis not present

## 2020-09-19 DIAGNOSIS — I119 Hypertensive heart disease without heart failure: Secondary | ICD-10-CM | POA: Diagnosis not present

## 2020-09-19 DIAGNOSIS — E440 Moderate protein-calorie malnutrition: Secondary | ICD-10-CM | POA: Diagnosis not present

## 2020-09-19 DIAGNOSIS — F32A Depression, unspecified: Secondary | ICD-10-CM | POA: Diagnosis not present

## 2020-09-19 DIAGNOSIS — E785 Hyperlipidemia, unspecified: Secondary | ICD-10-CM | POA: Diagnosis not present

## 2020-09-19 DIAGNOSIS — F0280 Dementia in other diseases classified elsewhere without behavioral disturbance: Secondary | ICD-10-CM | POA: Diagnosis not present

## 2020-09-19 DIAGNOSIS — E1151 Type 2 diabetes mellitus with diabetic peripheral angiopathy without gangrene: Secondary | ICD-10-CM | POA: Diagnosis not present

## 2020-09-19 DIAGNOSIS — D62 Acute posthemorrhagic anemia: Secondary | ICD-10-CM | POA: Diagnosis not present

## 2020-09-22 DIAGNOSIS — D62 Acute posthemorrhagic anemia: Secondary | ICD-10-CM | POA: Diagnosis not present

## 2020-09-22 DIAGNOSIS — E538 Deficiency of other specified B group vitamins: Secondary | ICD-10-CM | POA: Diagnosis not present

## 2020-09-22 DIAGNOSIS — E1151 Type 2 diabetes mellitus with diabetic peripheral angiopathy without gangrene: Secondary | ICD-10-CM | POA: Diagnosis not present

## 2020-09-22 DIAGNOSIS — E785 Hyperlipidemia, unspecified: Secondary | ICD-10-CM | POA: Diagnosis not present

## 2020-09-22 DIAGNOSIS — I119 Hypertensive heart disease without heart failure: Secondary | ICD-10-CM | POA: Diagnosis not present

## 2020-09-22 DIAGNOSIS — E440 Moderate protein-calorie malnutrition: Secondary | ICD-10-CM | POA: Diagnosis not present

## 2020-09-22 DIAGNOSIS — F0280 Dementia in other diseases classified elsewhere without behavioral disturbance: Secondary | ICD-10-CM | POA: Diagnosis not present

## 2020-09-22 DIAGNOSIS — F32A Depression, unspecified: Secondary | ICD-10-CM | POA: Diagnosis not present

## 2020-09-22 DIAGNOSIS — S72142D Displaced intertrochanteric fracture of left femur, subsequent encounter for closed fracture with routine healing: Secondary | ICD-10-CM | POA: Diagnosis not present

## 2020-09-26 DIAGNOSIS — F32A Depression, unspecified: Secondary | ICD-10-CM | POA: Diagnosis not present

## 2020-09-26 DIAGNOSIS — S72142D Displaced intertrochanteric fracture of left femur, subsequent encounter for closed fracture with routine healing: Secondary | ICD-10-CM | POA: Diagnosis not present

## 2020-09-26 DIAGNOSIS — I119 Hypertensive heart disease without heart failure: Secondary | ICD-10-CM | POA: Diagnosis not present

## 2020-09-26 DIAGNOSIS — F0280 Dementia in other diseases classified elsewhere without behavioral disturbance: Secondary | ICD-10-CM | POA: Diagnosis not present

## 2020-09-26 DIAGNOSIS — E538 Deficiency of other specified B group vitamins: Secondary | ICD-10-CM | POA: Diagnosis not present

## 2020-09-26 DIAGNOSIS — E1151 Type 2 diabetes mellitus with diabetic peripheral angiopathy without gangrene: Secondary | ICD-10-CM | POA: Diagnosis not present

## 2020-09-26 DIAGNOSIS — D62 Acute posthemorrhagic anemia: Secondary | ICD-10-CM | POA: Diagnosis not present

## 2020-09-26 DIAGNOSIS — E440 Moderate protein-calorie malnutrition: Secondary | ICD-10-CM | POA: Diagnosis not present

## 2020-09-26 DIAGNOSIS — E785 Hyperlipidemia, unspecified: Secondary | ICD-10-CM | POA: Diagnosis not present

## 2020-09-29 DIAGNOSIS — E1151 Type 2 diabetes mellitus with diabetic peripheral angiopathy without gangrene: Secondary | ICD-10-CM | POA: Diagnosis not present

## 2020-09-29 DIAGNOSIS — F32A Depression, unspecified: Secondary | ICD-10-CM | POA: Diagnosis not present

## 2020-09-29 DIAGNOSIS — F0280 Dementia in other diseases classified elsewhere without behavioral disturbance: Secondary | ICD-10-CM | POA: Diagnosis not present

## 2020-09-29 DIAGNOSIS — E538 Deficiency of other specified B group vitamins: Secondary | ICD-10-CM | POA: Diagnosis not present

## 2020-09-29 DIAGNOSIS — E785 Hyperlipidemia, unspecified: Secondary | ICD-10-CM | POA: Diagnosis not present

## 2020-09-29 DIAGNOSIS — I119 Hypertensive heart disease without heart failure: Secondary | ICD-10-CM | POA: Diagnosis not present

## 2020-09-29 DIAGNOSIS — S72142D Displaced intertrochanteric fracture of left femur, subsequent encounter for closed fracture with routine healing: Secondary | ICD-10-CM | POA: Diagnosis not present

## 2020-09-29 DIAGNOSIS — D62 Acute posthemorrhagic anemia: Secondary | ICD-10-CM | POA: Diagnosis not present

## 2020-09-29 DIAGNOSIS — E440 Moderate protein-calorie malnutrition: Secondary | ICD-10-CM | POA: Diagnosis not present

## 2020-10-02 DIAGNOSIS — F32A Depression, unspecified: Secondary | ICD-10-CM | POA: Diagnosis not present

## 2020-10-02 DIAGNOSIS — I119 Hypertensive heart disease without heart failure: Secondary | ICD-10-CM | POA: Diagnosis not present

## 2020-10-02 DIAGNOSIS — E1151 Type 2 diabetes mellitus with diabetic peripheral angiopathy without gangrene: Secondary | ICD-10-CM | POA: Diagnosis not present

## 2020-10-02 DIAGNOSIS — S72142D Displaced intertrochanteric fracture of left femur, subsequent encounter for closed fracture with routine healing: Secondary | ICD-10-CM | POA: Diagnosis not present

## 2020-10-02 DIAGNOSIS — F0280 Dementia in other diseases classified elsewhere without behavioral disturbance: Secondary | ICD-10-CM | POA: Diagnosis not present

## 2020-10-02 DIAGNOSIS — D62 Acute posthemorrhagic anemia: Secondary | ICD-10-CM | POA: Diagnosis not present

## 2020-10-02 DIAGNOSIS — E538 Deficiency of other specified B group vitamins: Secondary | ICD-10-CM | POA: Diagnosis not present

## 2020-10-02 DIAGNOSIS — E440 Moderate protein-calorie malnutrition: Secondary | ICD-10-CM | POA: Diagnosis not present

## 2020-10-02 DIAGNOSIS — E785 Hyperlipidemia, unspecified: Secondary | ICD-10-CM | POA: Diagnosis not present

## 2020-10-04 ENCOUNTER — Other Ambulatory Visit: Payer: Self-pay

## 2020-10-04 ENCOUNTER — Ambulatory Visit: Payer: Medicare HMO

## 2020-10-04 ENCOUNTER — Encounter: Payer: Self-pay | Admitting: Orthopedic Surgery

## 2020-10-04 ENCOUNTER — Ambulatory Visit (INDEPENDENT_AMBULATORY_CARE_PROVIDER_SITE_OTHER): Payer: Medicare HMO | Admitting: Orthopedic Surgery

## 2020-10-04 ENCOUNTER — Other Ambulatory Visit: Payer: Self-pay | Admitting: Adult Health

## 2020-10-04 VITALS — Ht 62.0 in | Wt 155.0 lb

## 2020-10-04 DIAGNOSIS — E1151 Type 2 diabetes mellitus with diabetic peripheral angiopathy without gangrene: Secondary | ICD-10-CM | POA: Diagnosis not present

## 2020-10-04 DIAGNOSIS — S72142D Displaced intertrochanteric fracture of left femur, subsequent encounter for closed fracture with routine healing: Secondary | ICD-10-CM

## 2020-10-04 DIAGNOSIS — Z8781 Personal history of (healed) traumatic fracture: Secondary | ICD-10-CM

## 2020-10-04 DIAGNOSIS — F0280 Dementia in other diseases classified elsewhere without behavioral disturbance: Secondary | ICD-10-CM | POA: Diagnosis not present

## 2020-10-04 DIAGNOSIS — E785 Hyperlipidemia, unspecified: Secondary | ICD-10-CM | POA: Diagnosis not present

## 2020-10-04 DIAGNOSIS — I119 Hypertensive heart disease without heart failure: Secondary | ICD-10-CM | POA: Diagnosis not present

## 2020-10-04 DIAGNOSIS — F32A Depression, unspecified: Secondary | ICD-10-CM | POA: Diagnosis not present

## 2020-10-04 DIAGNOSIS — E440 Moderate protein-calorie malnutrition: Secondary | ICD-10-CM | POA: Diagnosis not present

## 2020-10-04 DIAGNOSIS — E538 Deficiency of other specified B group vitamins: Secondary | ICD-10-CM | POA: Diagnosis not present

## 2020-10-04 DIAGNOSIS — D62 Acute posthemorrhagic anemia: Secondary | ICD-10-CM | POA: Diagnosis not present

## 2020-10-04 NOTE — Progress Notes (Signed)
Orthopaedic Postop Note  Assessment: Misty Price is a 85 y.o. female s/p cephalomedullary nail for left intertrochanteric femur fracture; persistent left knee pain s/p L TKA  DOS: 07/13/2020  Plan: Mrs. Detamore has done well since we last saw her in clinic.  Her pain is significantly improved.  She is now at home.  She does need considerable assistance, but she is able to ambulate.  No issues with her surgical incisions, anticipate that she will continue to improve.  Radiographs demonstrates maintenance of alignment.  We will plan to see her around the 1 year anniversary of her surgery.  All questions were answered and the family is amenable to this plan.    Follow-up: Return in about 9 months (around 07/13/2021). XR at next visit: [facility] pelvis and left hip/femur  Subjective:  Chief Complaint  Patient presents with  . Routine Post Op    S/P Lt hip ORIF DOS 07/13/20    History of Present Illness: Misty Price is a 85 y.o. female who presents following the above stated procedure.  She is doing well, compared to her last visit.  She has since been discharged from the skilled nursing facility, and has done very well at home.  Occasionally, she will ambulate without assistance, but usually is using an assistive device.  She does have some swelling which is noticeable more so in her operative extremity.  No issues with the surgical incisions.  She does not take any pain medications on a consistent basis.  Review of Systems: No fevers or chills No numbness or tingling No Chest Pain No shortness of breath   Objective: Ht 5\' 2"  (1.575 m)   Wt 155 lb (70.3 kg)   BMI 28.35 kg/m   Physical Exam:  Elderly female, seated in wheelchair.  No acute distress.  Left hip with well-healed surgical incisions.  No surrounding erythema or drainage.  She responds to light touch distally.  Active motion intact in the left lower extremity.  She is able to maintain a straight leg raise.  She  tolerates axial loading of the left hip.  She also tolerates gentle range of motion of the left hip.  IMAGING: I personally ordered and reviewed the following images:   X-rays of the left hip, as well as the left femur demonstrates maintenance of alignment of the intertrochanteric femur fracture.  There is been no interval hardware failure or loosening.  There is no lag screw cut out.  Hardware remains in a good position.  Evidence of healing at the fracture site.  No acute injuries.  Impression: Stable left intertrochanteric femur fracture status post fixation with a cephalomedullary nail.   , MD 10/04/2020 11:12 AM

## 2020-10-05 ENCOUNTER — Other Ambulatory Visit: Payer: Self-pay | Admitting: Adult Health

## 2020-10-05 DIAGNOSIS — S72142D Displaced intertrochanteric fracture of left femur, subsequent encounter for closed fracture with routine healing: Secondary | ICD-10-CM | POA: Diagnosis not present

## 2020-10-06 DIAGNOSIS — E440 Moderate protein-calorie malnutrition: Secondary | ICD-10-CM | POA: Diagnosis not present

## 2020-10-06 DIAGNOSIS — D62 Acute posthemorrhagic anemia: Secondary | ICD-10-CM | POA: Diagnosis not present

## 2020-10-06 DIAGNOSIS — S72142D Displaced intertrochanteric fracture of left femur, subsequent encounter for closed fracture with routine healing: Secondary | ICD-10-CM | POA: Diagnosis not present

## 2020-10-06 DIAGNOSIS — E538 Deficiency of other specified B group vitamins: Secondary | ICD-10-CM | POA: Diagnosis not present

## 2020-10-06 DIAGNOSIS — I119 Hypertensive heart disease without heart failure: Secondary | ICD-10-CM | POA: Diagnosis not present

## 2020-10-06 DIAGNOSIS — E785 Hyperlipidemia, unspecified: Secondary | ICD-10-CM | POA: Diagnosis not present

## 2020-10-06 DIAGNOSIS — E1151 Type 2 diabetes mellitus with diabetic peripheral angiopathy without gangrene: Secondary | ICD-10-CM | POA: Diagnosis not present

## 2020-10-06 DIAGNOSIS — F32A Depression, unspecified: Secondary | ICD-10-CM | POA: Diagnosis not present

## 2020-10-06 DIAGNOSIS — F0280 Dementia in other diseases classified elsewhere without behavioral disturbance: Secondary | ICD-10-CM | POA: Diagnosis not present

## 2020-10-07 ENCOUNTER — Other Ambulatory Visit: Payer: Self-pay | Admitting: Adult Health

## 2020-10-09 DIAGNOSIS — E785 Hyperlipidemia, unspecified: Secondary | ICD-10-CM | POA: Diagnosis not present

## 2020-10-09 DIAGNOSIS — I119 Hypertensive heart disease without heart failure: Secondary | ICD-10-CM | POA: Diagnosis not present

## 2020-10-09 DIAGNOSIS — E538 Deficiency of other specified B group vitamins: Secondary | ICD-10-CM | POA: Diagnosis not present

## 2020-10-09 DIAGNOSIS — D62 Acute posthemorrhagic anemia: Secondary | ICD-10-CM | POA: Diagnosis not present

## 2020-10-09 DIAGNOSIS — E1151 Type 2 diabetes mellitus with diabetic peripheral angiopathy without gangrene: Secondary | ICD-10-CM | POA: Diagnosis not present

## 2020-10-09 DIAGNOSIS — F0280 Dementia in other diseases classified elsewhere without behavioral disturbance: Secondary | ICD-10-CM | POA: Diagnosis not present

## 2020-10-09 DIAGNOSIS — F32A Depression, unspecified: Secondary | ICD-10-CM | POA: Diagnosis not present

## 2020-10-09 DIAGNOSIS — S72142D Displaced intertrochanteric fracture of left femur, subsequent encounter for closed fracture with routine healing: Secondary | ICD-10-CM | POA: Diagnosis not present

## 2020-10-09 DIAGNOSIS — E440 Moderate protein-calorie malnutrition: Secondary | ICD-10-CM | POA: Diagnosis not present

## 2020-10-10 DIAGNOSIS — I119 Hypertensive heart disease without heart failure: Secondary | ICD-10-CM | POA: Diagnosis not present

## 2020-10-10 DIAGNOSIS — E1151 Type 2 diabetes mellitus with diabetic peripheral angiopathy without gangrene: Secondary | ICD-10-CM | POA: Diagnosis not present

## 2020-10-10 DIAGNOSIS — D62 Acute posthemorrhagic anemia: Secondary | ICD-10-CM | POA: Diagnosis not present

## 2020-10-10 DIAGNOSIS — E440 Moderate protein-calorie malnutrition: Secondary | ICD-10-CM | POA: Diagnosis not present

## 2020-10-10 DIAGNOSIS — F32A Depression, unspecified: Secondary | ICD-10-CM | POA: Diagnosis not present

## 2020-10-10 DIAGNOSIS — E538 Deficiency of other specified B group vitamins: Secondary | ICD-10-CM | POA: Diagnosis not present

## 2020-10-10 DIAGNOSIS — S72142D Displaced intertrochanteric fracture of left femur, subsequent encounter for closed fracture with routine healing: Secondary | ICD-10-CM | POA: Diagnosis not present

## 2020-10-10 DIAGNOSIS — F0280 Dementia in other diseases classified elsewhere without behavioral disturbance: Secondary | ICD-10-CM | POA: Diagnosis not present

## 2020-10-10 DIAGNOSIS — E785 Hyperlipidemia, unspecified: Secondary | ICD-10-CM | POA: Diagnosis not present

## 2020-10-12 DIAGNOSIS — E440 Moderate protein-calorie malnutrition: Secondary | ICD-10-CM | POA: Diagnosis not present

## 2020-10-12 DIAGNOSIS — E1151 Type 2 diabetes mellitus with diabetic peripheral angiopathy without gangrene: Secondary | ICD-10-CM | POA: Diagnosis not present

## 2020-10-12 DIAGNOSIS — I119 Hypertensive heart disease without heart failure: Secondary | ICD-10-CM | POA: Diagnosis not present

## 2020-10-12 DIAGNOSIS — E538 Deficiency of other specified B group vitamins: Secondary | ICD-10-CM | POA: Diagnosis not present

## 2020-10-12 DIAGNOSIS — F32A Depression, unspecified: Secondary | ICD-10-CM | POA: Diagnosis not present

## 2020-10-12 DIAGNOSIS — S72142D Displaced intertrochanteric fracture of left femur, subsequent encounter for closed fracture with routine healing: Secondary | ICD-10-CM | POA: Diagnosis not present

## 2020-10-12 DIAGNOSIS — F0280 Dementia in other diseases classified elsewhere without behavioral disturbance: Secondary | ICD-10-CM | POA: Diagnosis not present

## 2020-10-12 DIAGNOSIS — E785 Hyperlipidemia, unspecified: Secondary | ICD-10-CM | POA: Diagnosis not present

## 2020-10-12 DIAGNOSIS — D62 Acute posthemorrhagic anemia: Secondary | ICD-10-CM | POA: Diagnosis not present

## 2020-10-16 DIAGNOSIS — I119 Hypertensive heart disease without heart failure: Secondary | ICD-10-CM | POA: Diagnosis not present

## 2020-10-16 DIAGNOSIS — D62 Acute posthemorrhagic anemia: Secondary | ICD-10-CM | POA: Diagnosis not present

## 2020-10-16 DIAGNOSIS — E1151 Type 2 diabetes mellitus with diabetic peripheral angiopathy without gangrene: Secondary | ICD-10-CM | POA: Diagnosis not present

## 2020-10-16 DIAGNOSIS — E440 Moderate protein-calorie malnutrition: Secondary | ICD-10-CM | POA: Diagnosis not present

## 2020-10-16 DIAGNOSIS — E785 Hyperlipidemia, unspecified: Secondary | ICD-10-CM | POA: Diagnosis not present

## 2020-10-16 DIAGNOSIS — E538 Deficiency of other specified B group vitamins: Secondary | ICD-10-CM | POA: Diagnosis not present

## 2020-10-16 DIAGNOSIS — F32A Depression, unspecified: Secondary | ICD-10-CM | POA: Diagnosis not present

## 2020-10-16 DIAGNOSIS — F0280 Dementia in other diseases classified elsewhere without behavioral disturbance: Secondary | ICD-10-CM | POA: Diagnosis not present

## 2020-10-16 DIAGNOSIS — S72142D Displaced intertrochanteric fracture of left femur, subsequent encounter for closed fracture with routine healing: Secondary | ICD-10-CM | POA: Diagnosis not present

## 2020-10-17 ENCOUNTER — Other Ambulatory Visit: Payer: Self-pay | Admitting: Adult Health

## 2020-10-17 NOTE — Telephone Encounter (Signed)
Called and left a vm on daughter Larene Beach) voicemail to have medication refilled by PCP.

## 2020-10-18 DIAGNOSIS — D62 Acute posthemorrhagic anemia: Secondary | ICD-10-CM | POA: Diagnosis not present

## 2020-10-18 DIAGNOSIS — E1151 Type 2 diabetes mellitus with diabetic peripheral angiopathy without gangrene: Secondary | ICD-10-CM | POA: Diagnosis not present

## 2020-10-18 DIAGNOSIS — S72142D Displaced intertrochanteric fracture of left femur, subsequent encounter for closed fracture with routine healing: Secondary | ICD-10-CM | POA: Diagnosis not present

## 2020-10-18 DIAGNOSIS — F0280 Dementia in other diseases classified elsewhere without behavioral disturbance: Secondary | ICD-10-CM | POA: Diagnosis not present

## 2020-10-18 DIAGNOSIS — E538 Deficiency of other specified B group vitamins: Secondary | ICD-10-CM | POA: Diagnosis not present

## 2020-10-18 DIAGNOSIS — I119 Hypertensive heart disease without heart failure: Secondary | ICD-10-CM | POA: Diagnosis not present

## 2020-10-18 DIAGNOSIS — E785 Hyperlipidemia, unspecified: Secondary | ICD-10-CM | POA: Diagnosis not present

## 2020-10-18 DIAGNOSIS — E440 Moderate protein-calorie malnutrition: Secondary | ICD-10-CM | POA: Diagnosis not present

## 2020-10-18 DIAGNOSIS — F32A Depression, unspecified: Secondary | ICD-10-CM | POA: Diagnosis not present

## 2020-10-19 DIAGNOSIS — D62 Acute posthemorrhagic anemia: Secondary | ICD-10-CM | POA: Diagnosis not present

## 2020-10-19 DIAGNOSIS — E785 Hyperlipidemia, unspecified: Secondary | ICD-10-CM | POA: Diagnosis not present

## 2020-10-19 DIAGNOSIS — I119 Hypertensive heart disease without heart failure: Secondary | ICD-10-CM | POA: Diagnosis not present

## 2020-10-19 DIAGNOSIS — E538 Deficiency of other specified B group vitamins: Secondary | ICD-10-CM | POA: Diagnosis not present

## 2020-10-19 DIAGNOSIS — E440 Moderate protein-calorie malnutrition: Secondary | ICD-10-CM | POA: Diagnosis not present

## 2020-10-19 DIAGNOSIS — F0280 Dementia in other diseases classified elsewhere without behavioral disturbance: Secondary | ICD-10-CM | POA: Diagnosis not present

## 2020-10-19 DIAGNOSIS — E1151 Type 2 diabetes mellitus with diabetic peripheral angiopathy without gangrene: Secondary | ICD-10-CM | POA: Diagnosis not present

## 2020-10-19 DIAGNOSIS — S72142D Displaced intertrochanteric fracture of left femur, subsequent encounter for closed fracture with routine healing: Secondary | ICD-10-CM | POA: Diagnosis not present

## 2020-10-19 DIAGNOSIS — F32A Depression, unspecified: Secondary | ICD-10-CM | POA: Diagnosis not present

## 2020-10-24 DIAGNOSIS — E1151 Type 2 diabetes mellitus with diabetic peripheral angiopathy without gangrene: Secondary | ICD-10-CM | POA: Diagnosis not present

## 2020-10-24 DIAGNOSIS — S72142D Displaced intertrochanteric fracture of left femur, subsequent encounter for closed fracture with routine healing: Secondary | ICD-10-CM | POA: Diagnosis not present

## 2020-10-24 DIAGNOSIS — E440 Moderate protein-calorie malnutrition: Secondary | ICD-10-CM | POA: Diagnosis not present

## 2020-10-24 DIAGNOSIS — D62 Acute posthemorrhagic anemia: Secondary | ICD-10-CM | POA: Diagnosis not present

## 2020-10-24 DIAGNOSIS — E538 Deficiency of other specified B group vitamins: Secondary | ICD-10-CM | POA: Diagnosis not present

## 2020-10-24 DIAGNOSIS — E785 Hyperlipidemia, unspecified: Secondary | ICD-10-CM | POA: Diagnosis not present

## 2020-10-24 DIAGNOSIS — F32A Depression, unspecified: Secondary | ICD-10-CM | POA: Diagnosis not present

## 2020-10-24 DIAGNOSIS — I119 Hypertensive heart disease without heart failure: Secondary | ICD-10-CM | POA: Diagnosis not present

## 2020-10-24 DIAGNOSIS — F0280 Dementia in other diseases classified elsewhere without behavioral disturbance: Secondary | ICD-10-CM | POA: Diagnosis not present

## 2020-10-26 DIAGNOSIS — F0280 Dementia in other diseases classified elsewhere without behavioral disturbance: Secondary | ICD-10-CM | POA: Diagnosis not present

## 2020-10-26 DIAGNOSIS — S72142D Displaced intertrochanteric fracture of left femur, subsequent encounter for closed fracture with routine healing: Secondary | ICD-10-CM | POA: Diagnosis not present

## 2020-10-26 DIAGNOSIS — D62 Acute posthemorrhagic anemia: Secondary | ICD-10-CM | POA: Diagnosis not present

## 2020-10-26 DIAGNOSIS — E1151 Type 2 diabetes mellitus with diabetic peripheral angiopathy without gangrene: Secondary | ICD-10-CM | POA: Diagnosis not present

## 2020-10-26 DIAGNOSIS — F32A Depression, unspecified: Secondary | ICD-10-CM | POA: Diagnosis not present

## 2020-10-26 DIAGNOSIS — I119 Hypertensive heart disease without heart failure: Secondary | ICD-10-CM | POA: Diagnosis not present

## 2020-10-26 DIAGNOSIS — E538 Deficiency of other specified B group vitamins: Secondary | ICD-10-CM | POA: Diagnosis not present

## 2020-10-26 DIAGNOSIS — E440 Moderate protein-calorie malnutrition: Secondary | ICD-10-CM | POA: Diagnosis not present

## 2020-10-26 DIAGNOSIS — E785 Hyperlipidemia, unspecified: Secondary | ICD-10-CM | POA: Diagnosis not present

## 2020-10-30 DIAGNOSIS — F0280 Dementia in other diseases classified elsewhere without behavioral disturbance: Secondary | ICD-10-CM | POA: Diagnosis not present

## 2020-10-30 DIAGNOSIS — E785 Hyperlipidemia, unspecified: Secondary | ICD-10-CM | POA: Diagnosis not present

## 2020-10-30 DIAGNOSIS — S72142D Displaced intertrochanteric fracture of left femur, subsequent encounter for closed fracture with routine healing: Secondary | ICD-10-CM | POA: Diagnosis not present

## 2020-10-30 DIAGNOSIS — E538 Deficiency of other specified B group vitamins: Secondary | ICD-10-CM | POA: Diagnosis not present

## 2020-10-30 DIAGNOSIS — E440 Moderate protein-calorie malnutrition: Secondary | ICD-10-CM | POA: Diagnosis not present

## 2020-10-30 DIAGNOSIS — F32A Depression, unspecified: Secondary | ICD-10-CM | POA: Diagnosis not present

## 2020-10-30 DIAGNOSIS — E1151 Type 2 diabetes mellitus with diabetic peripheral angiopathy without gangrene: Secondary | ICD-10-CM | POA: Diagnosis not present

## 2020-10-30 DIAGNOSIS — I119 Hypertensive heart disease without heart failure: Secondary | ICD-10-CM | POA: Diagnosis not present

## 2020-10-30 DIAGNOSIS — D62 Acute posthemorrhagic anemia: Secondary | ICD-10-CM | POA: Diagnosis not present

## 2020-11-01 DIAGNOSIS — E1151 Type 2 diabetes mellitus with diabetic peripheral angiopathy without gangrene: Secondary | ICD-10-CM | POA: Diagnosis not present

## 2020-11-01 DIAGNOSIS — F0280 Dementia in other diseases classified elsewhere without behavioral disturbance: Secondary | ICD-10-CM | POA: Diagnosis not present

## 2020-11-01 DIAGNOSIS — E538 Deficiency of other specified B group vitamins: Secondary | ICD-10-CM | POA: Diagnosis not present

## 2020-11-01 DIAGNOSIS — D62 Acute posthemorrhagic anemia: Secondary | ICD-10-CM | POA: Diagnosis not present

## 2020-11-01 DIAGNOSIS — E440 Moderate protein-calorie malnutrition: Secondary | ICD-10-CM | POA: Diagnosis not present

## 2020-11-01 DIAGNOSIS — F32A Depression, unspecified: Secondary | ICD-10-CM | POA: Diagnosis not present

## 2020-11-01 DIAGNOSIS — E785 Hyperlipidemia, unspecified: Secondary | ICD-10-CM | POA: Diagnosis not present

## 2020-11-01 DIAGNOSIS — S72142D Displaced intertrochanteric fracture of left femur, subsequent encounter for closed fracture with routine healing: Secondary | ICD-10-CM | POA: Diagnosis not present

## 2020-11-01 DIAGNOSIS — I119 Hypertensive heart disease without heart failure: Secondary | ICD-10-CM | POA: Diagnosis not present

## 2020-11-05 DIAGNOSIS — S72142D Displaced intertrochanteric fracture of left femur, subsequent encounter for closed fracture with routine healing: Secondary | ICD-10-CM | POA: Diagnosis not present

## 2020-11-06 DIAGNOSIS — E440 Moderate protein-calorie malnutrition: Secondary | ICD-10-CM | POA: Diagnosis not present

## 2020-11-06 DIAGNOSIS — F0280 Dementia in other diseases classified elsewhere without behavioral disturbance: Secondary | ICD-10-CM | POA: Diagnosis not present

## 2020-11-06 DIAGNOSIS — S72142D Displaced intertrochanteric fracture of left femur, subsequent encounter for closed fracture with routine healing: Secondary | ICD-10-CM | POA: Diagnosis not present

## 2020-11-06 DIAGNOSIS — E1151 Type 2 diabetes mellitus with diabetic peripheral angiopathy without gangrene: Secondary | ICD-10-CM | POA: Diagnosis not present

## 2020-11-06 DIAGNOSIS — E785 Hyperlipidemia, unspecified: Secondary | ICD-10-CM | POA: Diagnosis not present

## 2020-11-06 DIAGNOSIS — E538 Deficiency of other specified B group vitamins: Secondary | ICD-10-CM | POA: Diagnosis not present

## 2020-11-06 DIAGNOSIS — I119 Hypertensive heart disease without heart failure: Secondary | ICD-10-CM | POA: Diagnosis not present

## 2020-11-06 DIAGNOSIS — F32A Depression, unspecified: Secondary | ICD-10-CM | POA: Diagnosis not present

## 2020-11-06 DIAGNOSIS — D62 Acute posthemorrhagic anemia: Secondary | ICD-10-CM | POA: Diagnosis not present

## 2020-11-10 ENCOUNTER — Other Ambulatory Visit: Payer: Self-pay | Admitting: Adult Health

## 2020-11-16 DIAGNOSIS — D510 Vitamin B12 deficiency anemia due to intrinsic factor deficiency: Secondary | ICD-10-CM | POA: Diagnosis not present

## 2020-11-16 DIAGNOSIS — I119 Hypertensive heart disease without heart failure: Secondary | ICD-10-CM | POA: Diagnosis not present

## 2020-11-16 DIAGNOSIS — E539 Vitamin B deficiency, unspecified: Secondary | ICD-10-CM | POA: Diagnosis not present

## 2020-11-28 ENCOUNTER — Other Ambulatory Visit: Payer: Self-pay | Admitting: Adult Health

## 2020-12-05 DIAGNOSIS — S72142D Displaced intertrochanteric fracture of left femur, subsequent encounter for closed fracture with routine healing: Secondary | ICD-10-CM | POA: Diagnosis not present

## 2020-12-19 DIAGNOSIS — Z0001 Encounter for general adult medical examination with abnormal findings: Secondary | ICD-10-CM | POA: Diagnosis not present

## 2020-12-19 DIAGNOSIS — I10 Essential (primary) hypertension: Secondary | ICD-10-CM | POA: Diagnosis not present

## 2020-12-19 DIAGNOSIS — N1831 Chronic kidney disease, stage 3a: Secondary | ICD-10-CM | POA: Diagnosis not present

## 2020-12-19 DIAGNOSIS — E782 Mixed hyperlipidemia: Secondary | ICD-10-CM | POA: Diagnosis not present

## 2020-12-19 DIAGNOSIS — R1310 Dysphagia, unspecified: Secondary | ICD-10-CM | POA: Diagnosis not present

## 2020-12-19 DIAGNOSIS — Z0000 Encounter for general adult medical examination without abnormal findings: Secondary | ICD-10-CM | POA: Diagnosis not present

## 2020-12-19 DIAGNOSIS — E1165 Type 2 diabetes mellitus with hyperglycemia: Secondary | ICD-10-CM | POA: Diagnosis not present

## 2020-12-19 DIAGNOSIS — S728X2D Other fracture of left femur, subsequent encounter for closed fracture with routine healing: Secondary | ICD-10-CM | POA: Diagnosis not present

## 2020-12-19 DIAGNOSIS — G40909 Epilepsy, unspecified, not intractable, without status epilepticus: Secondary | ICD-10-CM | POA: Diagnosis not present

## 2020-12-19 DIAGNOSIS — Z9189 Other specified personal risk factors, not elsewhere classified: Secondary | ICD-10-CM | POA: Diagnosis not present

## 2021-01-02 ENCOUNTER — Other Ambulatory Visit: Payer: Self-pay

## 2021-01-02 ENCOUNTER — Ambulatory Visit: Payer: Medicare HMO | Admitting: Neurology

## 2021-01-02 ENCOUNTER — Encounter: Payer: Self-pay | Admitting: Neurology

## 2021-01-02 VITALS — BP 124/76 | HR 95 | Resp 20 | Ht 60.0 in | Wt 146.0 lb

## 2021-01-02 DIAGNOSIS — R404 Transient alteration of awareness: Secondary | ICD-10-CM

## 2021-01-02 NOTE — Patient Instructions (Signed)
Start weaning off Keppra: take 1 tablet every night for 1 week, then reduce to 1 tablets every other night for 1 week, then stop.  2. Follow-up in 6 months, call for any changes
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
## 2021-01-04 ENCOUNTER — Other Ambulatory Visit (HOSPITAL_COMMUNITY): Payer: Self-pay | Admitting: Specialist

## 2021-01-04 DIAGNOSIS — T17908S Unspecified foreign body in respiratory tract, part unspecified causing other injury, sequela: Secondary | ICD-10-CM

## 2021-01-04 DIAGNOSIS — R1312 Dysphagia, oropharyngeal phase: Secondary | ICD-10-CM

## 2021-01-05 DIAGNOSIS — S72142D Displaced intertrochanteric fracture of left femur, subsequent encounter for closed fracture with routine healing: Secondary | ICD-10-CM | POA: Diagnosis not present

## 2021-01-09 ENCOUNTER — Ambulatory Visit: Payer: Medicare HMO | Admitting: Neurology

## 2021-01-11 ENCOUNTER — Other Ambulatory Visit: Payer: Self-pay

## 2021-01-11 ENCOUNTER — Ambulatory Visit (HOSPITAL_COMMUNITY)
Admission: RE | Admit: 2021-01-11 | Discharge: 2021-01-11 | Disposition: A | Discharge status: Home / Self Care | Payer: Medicare HMO | Source: Ambulatory Visit | Attending: Internal Medicine | Admitting: Internal Medicine

## 2021-01-11 ENCOUNTER — Encounter (HOSPITAL_COMMUNITY): Payer: Self-pay | Admitting: Speech Pathology

## 2021-01-11 ENCOUNTER — Ambulatory Visit (HOSPITAL_COMMUNITY): Payer: Medicare HMO | Attending: Internal Medicine | Admitting: Speech Pathology

## 2021-01-11 DIAGNOSIS — R1312 Dysphagia, oropharyngeal phase: Secondary | ICD-10-CM | POA: Diagnosis not present

## 2021-01-11 DIAGNOSIS — R6339 Other feeding difficulties: Secondary | ICD-10-CM | POA: Diagnosis not present

## 2021-01-11 DIAGNOSIS — T17908S Unspecified foreign body in respiratory tract, part unspecified causing other injury, sequela: Secondary | ICD-10-CM | POA: Diagnosis not present

## 2021-01-11 DIAGNOSIS — R1310 Dysphagia, unspecified: Secondary | ICD-10-CM | POA: Diagnosis not present

## 2021-01-11 DIAGNOSIS — T17320A Food in larynx causing asphyxiation, initial encounter: Secondary | ICD-10-CM | POA: Diagnosis not present

## 2021-01-11 NOTE — Therapy (Signed)
[facility] [address] [place], [place], [address] Phone: [phone]   Fax:  [phone]  Modified Barium Swallow  Patient Details  Name: Misty Price MRN: [id number] Date of Birth: 1935/10/13 No data recorded  Encounter Date: 01/11/2021   End of Session - 01/11/21 1515     Visit Number 1    Number of Visits 1    Authorization Type [employer] HMO    SLP Start Time 1130    SLP Stop Time  1210    SLP Time Calculation (min) 40 min    Activity Tolerance Patient tolerated treatment well             Past Medical History:  Diagnosis Date   Atherosclerosis    Cognitive communication deficit    Dementia (HCC)    Depression    Displaced intertrochanteric fracture of left femur (HCC)    History of falling    Hyperlipidemia    Hypertension    Moderate protein-calorie malnutrition (HCC)    Muscle weakness    Syncopal episodes    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   Subjective Assessment - 01/11/21 1454     Subjective "My mom says she coughs when she drinks liquids." -Pt's grandaughter    Patient is accompained by: Family member    Special Tests MBSS    Currently in Pain? No/denies                 General - 01/11/21 1456       General Information   HPI Misty Price is an 85 yo female who was referred by Dr. Catalina Pizza for MBSS due to Pt's daughter reporting that Pt has been coughing when drinking liquids. Pt was accompanied to today's appointment by her granddaughter.    Type of Study MBS-Modified  Barium Swallow Study    Diet Prior to this Study Regular;Thin liquids    Temperature Spikes Noted No    Respiratory Status Room air    History of Recent Intubation No    Behavior/Cognition Alert;Cooperative;Requires cueing    Oral Cavity Assessment Within Functional Limits   mild lingual coating   Oral Care Completed by SLP No    Oral Cavity - Dentition Adequate natural dentition    Vision Functional for self feeding    Self-Feeding Abilities Able to feed self    Patient Positioning Upright in chair    Baseline Vocal Quality Normal    Volitional Cough Strong    Volitional Swallow Able to elicit    Anatomy Within functional limits    Pharyngeal Secretions Not observed secondary MBS                Oral Preparation/Oral Phase - 01/11/21 1459       Oral Preparation/Oral Phase   Oral Phase Within functional limits      Electrical stimulation - Oral Phase   Was Electrical Stimulation Used No              Pharyngeal Phase - 01/11/21 1502  Pharyngeal Phase   Pharyngeal Phase Impaired      Pharyngeal - Thin   Pharyngeal- Thin Teaspoon Swallow initiation at vallecula;Within functional limits    Pharyngeal- Thin Cup Swallow initiation at vallecula;Within functional limits    Pharyngeal- Thin Straw Swallow initiation at vallecula;Within functional limits;Pharyngeal residue - valleculae   trace/min vallecular residue, clears with dry swallow     Pharyngeal - Solids   Pharyngeal- Puree Within functional limits;Swallow initiation at vallecula    Pharyngeal- Regular Within functional limits    Pharyngeal- Pill Penetration/Aspiration during swallow    Pharyngeal Material enters airway, remains ABOVE vocal cords then ejected out   of thins when taking barium tablet     Electrical Stimulation - Pharyngeal Phase   Was Electrical Stimulation Used No              Cricopharyngeal Phase - 01/11/21 1510       Cervical Esophageal Phase   Cervical Esophageal Phase  Impaired      Cervical Esophageal Phase - Solids   Pill Esophageal backflow into cervical esophagus      Cervical Esophageal Phase - Comment   Other Esophageal Phase Observations Barium tablet did not pass through GEJ with liquids, but did after a bite of puree wash, diffuse esophageal dysmotility observed as confirmed by radiologist and some backflow observed into the cervical esophagus.               Plan - 01/11/21 1516     Clinical Impression Statement Pt presents with normal for age oropharyngeal swallow with normal oral transit and swallow initiation at the level of the valleculae (occasional spilling to the pyriforms), adequate hyolaryngeal excursion, no signifcant residuals post swallow, and one episode of flash penetration of thins when taking the barium tablet with cup of thin barium. Esophageal sweep revealed stasis of barium tablet near the GE junction, but cleared after a bite of puree. Diffuse esophageal dysmotility noted and confirmed by radiologist with some backflow of barium noted to the cervical esophagus. Recommend regular textures and thin liquids with standard aspiration precautions and more strict reflux precautions (sit upright for all eating/drinking and remain upright for 30+ minutes after). No further SLP services indicated at this time. Pt and granddaughter appreciative of visit and information provided.   Treatment/Interventions Patient/family education    Consulted and Agree with Plan of Care Patient;Family member/caregiver             Patient will benefit from skilled therapeutic intervention in order to improve the following deficits and impairments:   Dysphagia, oropharyngeal phase     Recommendations/Treatment - 01/11/21 1513       Swallow Evaluation Recommendations   SLP Diet Recommendations Age appropriate regular;Thin    Liquid Administration via Cup;Straw    Medication Administration Whole meds with liquid    Supervision Patient able to  self feed    Postural Changes Seated upright at 90 degrees;Remain upright for at least 30 minutes after feeds/meals               Problem List Patient Active Problem List   Diagnosis Date Noted   Hyperlipidemia associated with type 2 diabetes mellitus (HCC) 07/19/2020   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) 07/19/2020   Aortic atherosclerosis (HCC) 07/19/2020   Protein-calorie malnutrition (HCC) 07/19/2020   Hyperlipidemia    Postoperative anemia due to acute blood loss    Closed comminuted intertrochanteric fracture of proximal end of left femur (HCC) 07/12/2020   Leukocytosis 07/12/2020  Hyperglycemia due to diabetes mellitus (HCC) 07/12/2020   Transaminitis 07/12/2020   Diabetes mellitus type 2 with peripheral artery disease (HCC) 09/27/2019   Hypertension associated with type 2 diabetes mellitus (HCC) 09/27/2019   Fall 09/26/2019   Dementia (HCC) 09/26/2019   Syncope 09/26/2019   Thank you,  Havery Moros, CCC-SLP [phone]   Emric Kowalewski 01/11/2021, 3:26 PM  [place] [facility] [address] [place], [place], [address] Phone: [phone]   Fax:  [phone]  Name: Misty Price MRN: [id number] Date of Birth: 02-Apr-1936

## 2021-02-05 DIAGNOSIS — S72142D Displaced intertrochanteric fracture of left femur, subsequent encounter for closed fracture with routine healing: Secondary | ICD-10-CM | POA: Diagnosis not present

## 2021-02-07 ENCOUNTER — Other Ambulatory Visit: Payer: Self-pay | Admitting: Adult Health

## 2021-03-07 DIAGNOSIS — S72142D Displaced intertrochanteric fracture of left femur, subsequent encounter for closed fracture with routine healing: Secondary | ICD-10-CM | POA: Diagnosis not present

## 2021-04-02 DIAGNOSIS — Z515 Encounter for palliative care: Secondary | ICD-10-CM | POA: Diagnosis not present

## 2021-04-02 DIAGNOSIS — F0390 Unspecified dementia without behavioral disturbance: Secondary | ICD-10-CM | POA: Diagnosis not present

## 2021-04-07 DIAGNOSIS — S72142D Displaced intertrochanteric fracture of left femur, subsequent encounter for closed fracture with routine healing: Secondary | ICD-10-CM | POA: Diagnosis not present

## 2021-04-17 ENCOUNTER — Encounter: Payer: Self-pay | Admitting: Neurology

## 2021-05-10 ENCOUNTER — Encounter: Payer: Self-pay | Admitting: Orthopedic Surgery

## 2021-05-22 DIAGNOSIS — D519 Vitamin B12 deficiency anemia, unspecified: Secondary | ICD-10-CM | POA: Diagnosis not present

## 2021-05-22 DIAGNOSIS — I25119 Atherosclerotic heart disease of native coronary artery with unspecified angina pectoris: Secondary | ICD-10-CM | POA: Diagnosis not present

## 2021-05-22 DIAGNOSIS — I10 Essential (primary) hypertension: Secondary | ICD-10-CM | POA: Diagnosis not present

## 2021-05-22 DIAGNOSIS — E1165 Type 2 diabetes mellitus with hyperglycemia: Secondary | ICD-10-CM | POA: Diagnosis not present

## 2021-05-22 DIAGNOSIS — R1310 Dysphagia, unspecified: Secondary | ICD-10-CM | POA: Diagnosis not present

## 2021-05-22 DIAGNOSIS — F331 Major depressive disorder, recurrent, moderate: Secondary | ICD-10-CM | POA: Diagnosis not present

## 2021-05-22 DIAGNOSIS — N1831 Chronic kidney disease, stage 3a: Secondary | ICD-10-CM | POA: Diagnosis not present

## 2021-05-22 DIAGNOSIS — S728X2D Other fracture of left femur, subsequent encounter for closed fracture with routine healing: Secondary | ICD-10-CM | POA: Diagnosis not present

## 2021-05-22 DIAGNOSIS — E782 Mixed hyperlipidemia: Secondary | ICD-10-CM | POA: Diagnosis not present

## 2021-07-10 ENCOUNTER — Ambulatory Visit: Payer: Medicare HMO | Admitting: Neurology

## 2021-07-10 ENCOUNTER — Other Ambulatory Visit: Payer: Self-pay

## 2021-07-10 ENCOUNTER — Encounter: Payer: Self-pay | Admitting: Neurology

## 2021-07-10 ENCOUNTER — Ambulatory Visit: Payer: Medicare HMO | Admitting: Orthopedic Surgery

## 2021-07-10 VITALS — BP 129/69 | HR 76 | Ht 62.0 in | Wt 155.0 lb

## 2021-07-10 DIAGNOSIS — G301 Alzheimer's disease with late onset: Secondary | ICD-10-CM | POA: Diagnosis not present

## 2021-07-10 DIAGNOSIS — G40209 Localization-related (focal) (partial) symptomatic epilepsy and epileptic syndromes with complex partial seizures, not intractable, without status epilepticus: Secondary | ICD-10-CM | POA: Diagnosis not present

## 2021-07-10 DIAGNOSIS — F02818 Dementia in other diseases classified elsewhere, unspecified severity, with other behavioral disturbance: Secondary | ICD-10-CM | POA: Diagnosis not present

## 2021-07-10 MED ORDER — LEVETIRACETAM 500 MG PO TABS: ORAL_TABLET | ORAL | 3 refills | Status: DC

## 2021-07-10 MED ORDER — SERTRALINE HCL 100 MG PO TABS: ORAL_TABLET | ORAL | 3 refills | Status: DC
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
## 2021-07-11 ENCOUNTER — Ambulatory Visit: Payer: Medicare HMO | Admitting: Orthopedic Surgery

## 2021-07-16 ENCOUNTER — Ambulatory Visit: Payer: Medicare HMO | Admitting: Neurology

## 2021-07-17 ENCOUNTER — Other Ambulatory Visit: Payer: Self-pay

## 2021-07-17 ENCOUNTER — Other Ambulatory Visit: Payer: Self-pay | Admitting: Neurology

## 2021-07-17 ENCOUNTER — Ambulatory Visit: Payer: Medicare HMO

## 2021-07-17 ENCOUNTER — Encounter: Payer: Self-pay | Admitting: Orthopedic Surgery

## 2021-07-17 ENCOUNTER — Ambulatory Visit: Payer: Medicare HMO | Admitting: Orthopedic Surgery

## 2021-07-17 DIAGNOSIS — Z8781 Personal history of (healed) traumatic fracture: Secondary | ICD-10-CM

## 2021-07-17 NOTE — Progress Notes (Signed)
Orthopaedic Postop Note  Assessment: Misty Price is a 86 y.o. female s/p cephalomedullary nail for left intertrochanteric femur fracture  DOS: 07/13/2020  Plan: No pain in her operative hip.  Radiographs remained stable.  She has no restrictions.  Continue with medications as needed.  Follow-up as needed.    Follow-up: No follow-ups on file. XR at next visit: AP pelvis and left hip/femur  Subjective:  Chief Complaint  Patient presents with   Routine Post Op    Lt hip DOS 07/13/20    History of Present Illness: Misty Price is a 86 y.o. female who presents following the above stated procedure.  Surgery was approximately 1 year ago.  She has done well.  She is now at home with her daughter.  Limited ambulation, but she is not having any pain in her left hip.  No issues with her surgical incisions.   Review of Systems: No fevers or chills No numbness or tingling No Chest Pain No shortness of breath   Objective: There were no vitals taken for this visit.  Physical Exam:  Elderly female, seated in wheelchair.  No acute distress.  Left hip with well-healed surgical incisions.  No surrounding erythema or drainage.  She responds to light touch distally.  Active motion intact in the left lower extremity.  She is able to maintain a straight leg raise.  She tolerates axial loading of the left hip.  She also tolerates gentle range of motion of the left hip.  IMAGING: I personally ordered and reviewed the following images:  AP pelvis, as well as the left femur x-rays demonstrate maintenance of alignment.  There is been no subsidence of the hardware.  No hardware failure or loosening.  No acute injuries are noted.  Impression: Stable left intertrochanteric femur fracture, without hardware failure.   Oliver Barre, MD 07/17/2021 11:59 AM

## 2021-09-14 DIAGNOSIS — D519 Vitamin B12 deficiency anemia, unspecified: Secondary | ICD-10-CM | POA: Diagnosis not present

## 2021-09-14 DIAGNOSIS — E1165 Type 2 diabetes mellitus with hyperglycemia: Secondary | ICD-10-CM | POA: Diagnosis not present

## 2021-09-14 DIAGNOSIS — E782 Mixed hyperlipidemia: Secondary | ICD-10-CM | POA: Diagnosis not present

## 2021-09-20 DIAGNOSIS — E1165 Type 2 diabetes mellitus with hyperglycemia: Secondary | ICD-10-CM | POA: Diagnosis not present

## 2021-09-20 DIAGNOSIS — I700 Atherosclerosis of aorta: Secondary | ICD-10-CM | POA: Diagnosis not present

## 2021-09-20 DIAGNOSIS — I10 Essential (primary) hypertension: Secondary | ICD-10-CM | POA: Diagnosis not present

## 2021-09-20 DIAGNOSIS — R1310 Dysphagia, unspecified: Secondary | ICD-10-CM | POA: Diagnosis not present

## 2021-09-20 DIAGNOSIS — D519 Vitamin B12 deficiency anemia, unspecified: Secondary | ICD-10-CM | POA: Diagnosis not present

## 2021-09-20 DIAGNOSIS — S728X2D Other fracture of left femur, subsequent encounter for closed fracture with routine healing: Secondary | ICD-10-CM | POA: Diagnosis not present

## 2021-09-20 DIAGNOSIS — F331 Major depressive disorder, recurrent, moderate: Secondary | ICD-10-CM | POA: Diagnosis not present

## 2021-09-20 DIAGNOSIS — N1831 Chronic kidney disease, stage 3a: Secondary | ICD-10-CM | POA: Diagnosis not present

## 2021-09-20 DIAGNOSIS — Z9189 Other specified personal risk factors, not elsewhere classified: Secondary | ICD-10-CM | POA: Diagnosis not present

## 2021-09-20 DIAGNOSIS — E782 Mixed hyperlipidemia: Secondary | ICD-10-CM | POA: Diagnosis not present

## 2021-09-20 DIAGNOSIS — D696 Thrombocytopenia, unspecified: Secondary | ICD-10-CM | POA: Diagnosis not present

## 2021-10-17 DIAGNOSIS — H5203 Hypermetropia, bilateral: Secondary | ICD-10-CM | POA: Diagnosis not present

## 2021-10-17 DIAGNOSIS — H52209 Unspecified astigmatism, unspecified eye: Secondary | ICD-10-CM | POA: Diagnosis not present

## 2021-10-17 DIAGNOSIS — H524 Presbyopia: Secondary | ICD-10-CM | POA: Diagnosis not present

## 2021-11-06 DIAGNOSIS — I10 Essential (primary) hypertension: Secondary | ICD-10-CM | POA: Diagnosis not present

## 2021-11-06 DIAGNOSIS — F430 Acute stress reaction: Secondary | ICD-10-CM | POA: Diagnosis not present

## 2021-11-06 DIAGNOSIS — E119 Type 2 diabetes mellitus without complications: Secondary | ICD-10-CM | POA: Diagnosis not present

## 2021-11-06 DIAGNOSIS — G40909 Epilepsy, unspecified, not intractable, without status epilepticus: Secondary | ICD-10-CM | POA: Diagnosis not present

## 2021-11-06 DIAGNOSIS — H6692 Otitis media, unspecified, left ear: Secondary | ICD-10-CM | POA: Diagnosis not present

## 2021-11-06 DIAGNOSIS — E782 Mixed hyperlipidemia: Secondary | ICD-10-CM | POA: Diagnosis not present

## 2021-11-06 DIAGNOSIS — G309 Alzheimer's disease, unspecified: Secondary | ICD-10-CM | POA: Diagnosis not present

## 2021-11-06 DIAGNOSIS — F32A Depression, unspecified: Secondary | ICD-10-CM | POA: Diagnosis not present

## 2022-01-16 ENCOUNTER — Ambulatory Visit: Payer: Medicare HMO | Admitting: Neurology

## 2022-07-25 ENCOUNTER — Encounter: Payer: Self-pay | Admitting: Radiology

## 2022-10-06 ENCOUNTER — Other Ambulatory Visit: Payer: Self-pay | Admitting: Neurology

## 2022-10-07 ENCOUNTER — Other Ambulatory Visit: Payer: Self-pay | Admitting: Neurology

## 2023-10-14 DIAGNOSIS — R8290 Unspecified abnormal findings in urine: Secondary | ICD-10-CM | POA: Diagnosis not present

## 2023-10-14 DIAGNOSIS — N1831 Chronic kidney disease, stage 3a: Secondary | ICD-10-CM | POA: Diagnosis not present

## 2023-10-14 DIAGNOSIS — G309 Alzheimer's disease, unspecified: Secondary | ICD-10-CM | POA: Diagnosis not present

## 2023-10-14 DIAGNOSIS — Z7689 Persons encountering health services in other specified circumstances: Secondary | ICD-10-CM | POA: Diagnosis not present

## 2023-10-14 DIAGNOSIS — E1165 Type 2 diabetes mellitus with hyperglycemia: Secondary | ICD-10-CM | POA: Diagnosis not present

## 2023-10-14 DIAGNOSIS — F0280 Dementia in other diseases classified elsewhere without behavioral disturbance: Secondary | ICD-10-CM | POA: Diagnosis not present

## 2023-10-14 DIAGNOSIS — R7989 Other specified abnormal findings of blood chemistry: Secondary | ICD-10-CM | POA: Diagnosis not present

## 2023-10-14 DIAGNOSIS — I10 Essential (primary) hypertension: Secondary | ICD-10-CM | POA: Diagnosis not present

## 2023-10-14 DIAGNOSIS — I129 Hypertensive chronic kidney disease with stage 1 through stage 4 chronic kidney disease, or unspecified chronic kidney disease: Secondary | ICD-10-CM | POA: Diagnosis not present

## 2023-10-15 DIAGNOSIS — E1165 Type 2 diabetes mellitus with hyperglycemia: Secondary | ICD-10-CM | POA: Diagnosis not present

## 2023-10-16 ENCOUNTER — Emergency Department (HOSPITAL_COMMUNITY)

## 2023-10-16 ENCOUNTER — Inpatient Hospital Stay (HOSPITAL_COMMUNITY)
Admission: EM | Admit: 2023-10-16 | Discharge: 2023-10-21 | DRG: 689 | Disposition: A | Discharge status: Home Health | Attending: Family Medicine | Admitting: Family Medicine

## 2023-10-16 ENCOUNTER — Other Ambulatory Visit: Payer: Self-pay

## 2023-10-16 DIAGNOSIS — E1169 Type 2 diabetes mellitus with other specified complication: Secondary | ICD-10-CM | POA: Diagnosis present

## 2023-10-16 DIAGNOSIS — E441 Mild protein-calorie malnutrition: Secondary | ICD-10-CM | POA: Diagnosis present

## 2023-10-16 DIAGNOSIS — R627 Adult failure to thrive: Secondary | ICD-10-CM | POA: Diagnosis present

## 2023-10-16 DIAGNOSIS — L8990 Pressure ulcer of unspecified site, unspecified stage: Secondary | ICD-10-CM | POA: Insufficient documentation

## 2023-10-16 DIAGNOSIS — N3001 Acute cystitis with hematuria: Principal | ICD-10-CM

## 2023-10-16 DIAGNOSIS — E1149 Type 2 diabetes mellitus with other diabetic neurological complication: Secondary | ICD-10-CM | POA: Diagnosis present

## 2023-10-16 DIAGNOSIS — N200 Calculus of kidney: Secondary | ICD-10-CM | POA: Diagnosis present

## 2023-10-16 DIAGNOSIS — L89153 Pressure ulcer of sacral region, stage 3: Secondary | ICD-10-CM | POA: Diagnosis present

## 2023-10-16 DIAGNOSIS — N218 Other lower urinary tract calculus: Secondary | ICD-10-CM | POA: Diagnosis not present

## 2023-10-16 DIAGNOSIS — E785 Hyperlipidemia, unspecified: Secondary | ICD-10-CM | POA: Diagnosis not present

## 2023-10-16 DIAGNOSIS — M8448XA Pathological fracture, other site, initial encounter for fracture: Secondary | ICD-10-CM | POA: Diagnosis present

## 2023-10-16 DIAGNOSIS — F32A Depression, unspecified: Secondary | ICD-10-CM | POA: Diagnosis present

## 2023-10-16 DIAGNOSIS — Z8249 Family history of ischemic heart disease and other diseases of the circulatory system: Secondary | ICD-10-CM

## 2023-10-16 DIAGNOSIS — Z993 Dependence on wheelchair: Secondary | ICD-10-CM | POA: Diagnosis not present

## 2023-10-16 DIAGNOSIS — Z9181 History of falling: Secondary | ICD-10-CM

## 2023-10-16 DIAGNOSIS — Z7984 Long term (current) use of oral hypoglycemic drugs: Secondary | ICD-10-CM | POA: Diagnosis not present

## 2023-10-16 DIAGNOSIS — E538 Deficiency of other specified B group vitamins: Secondary | ICD-10-CM | POA: Diagnosis present

## 2023-10-16 DIAGNOSIS — R0902 Hypoxemia: Secondary | ICD-10-CM | POA: Diagnosis not present

## 2023-10-16 DIAGNOSIS — Z794 Long term (current) use of insulin: Secondary | ICD-10-CM | POA: Diagnosis not present

## 2023-10-16 DIAGNOSIS — G9341 Metabolic encephalopathy: Secondary | ICD-10-CM | POA: Diagnosis present

## 2023-10-16 DIAGNOSIS — E46 Unspecified protein-calorie malnutrition: Secondary | ICD-10-CM | POA: Diagnosis present

## 2023-10-16 DIAGNOSIS — N132 Hydronephrosis with renal and ureteral calculous obstruction: Secondary | ICD-10-CM | POA: Diagnosis not present

## 2023-10-16 DIAGNOSIS — E860 Dehydration: Secondary | ICD-10-CM | POA: Diagnosis present

## 2023-10-16 DIAGNOSIS — I959 Hypotension, unspecified: Secondary | ICD-10-CM | POA: Diagnosis not present

## 2023-10-16 DIAGNOSIS — Z6824 Body mass index (BMI) 24.0-24.9, adult: Secondary | ICD-10-CM | POA: Diagnosis not present

## 2023-10-16 DIAGNOSIS — Z96653 Presence of artificial knee joint, bilateral: Secondary | ICD-10-CM | POA: Diagnosis present

## 2023-10-16 DIAGNOSIS — Z87891 Personal history of nicotine dependence: Secondary | ICD-10-CM | POA: Diagnosis not present

## 2023-10-16 DIAGNOSIS — E876 Hypokalemia: Secondary | ICD-10-CM | POA: Diagnosis present

## 2023-10-16 DIAGNOSIS — I10 Essential (primary) hypertension: Secondary | ICD-10-CM | POA: Diagnosis present

## 2023-10-16 DIAGNOSIS — R531 Weakness: Secondary | ICD-10-CM | POA: Diagnosis not present

## 2023-10-16 DIAGNOSIS — R739 Hyperglycemia, unspecified: Secondary | ICD-10-CM | POA: Diagnosis not present

## 2023-10-16 DIAGNOSIS — Z7982 Long term (current) use of aspirin: Secondary | ICD-10-CM

## 2023-10-16 DIAGNOSIS — K429 Umbilical hernia without obstruction or gangrene: Secondary | ICD-10-CM | POA: Diagnosis not present

## 2023-10-16 DIAGNOSIS — M84454A Pathological fracture, pelvis, initial encounter for fracture: Secondary | ICD-10-CM | POA: Diagnosis present

## 2023-10-16 DIAGNOSIS — E7800 Pure hypercholesterolemia, unspecified: Secondary | ICD-10-CM | POA: Diagnosis present

## 2023-10-16 DIAGNOSIS — J90 Pleural effusion, not elsewhere classified: Secondary | ICD-10-CM | POA: Diagnosis not present

## 2023-10-16 DIAGNOSIS — F0393 Unspecified dementia, unspecified severity, with mood disturbance: Secondary | ICD-10-CM | POA: Diagnosis present

## 2023-10-16 DIAGNOSIS — Z79899 Other long term (current) drug therapy: Secondary | ICD-10-CM

## 2023-10-16 DIAGNOSIS — N136 Pyonephrosis: Secondary | ICD-10-CM | POA: Diagnosis present

## 2023-10-16 DIAGNOSIS — E440 Moderate protein-calorie malnutrition: Secondary | ICD-10-CM | POA: Diagnosis not present

## 2023-10-16 LAB — URINALYSIS, ROUTINE W REFLEX MICROSCOPIC
Bilirubin Urine: NEGATIVE
Glucose, UA: NEGATIVE mg/dL
Ketones, ur: 20 mg/dL — AB
Nitrite: NEGATIVE
Protein, ur: 100 mg/dL — AB
RBC / HPF: >50 RBC/hpf (ref 0–5)
Specific Gravity, Urine: 1.041 — ABNORMAL HIGH (ref 1.005–1.030)
WBC, UA: >50 WBC/hpf (ref 0–5)
pH: 5 (ref 5.0–8.0)

## 2023-10-16 LAB — COMPREHENSIVE METABOLIC PANEL WITH GFR
ALT: 10 U/L (ref 0–44)
AST: 13 U/L — ABNORMAL LOW (ref 15–41)
Albumin: 2.2 g/dL — ABNORMAL LOW (ref 3.5–5.0)
Alkaline Phosphatase: 104 U/L (ref 38–126)
Anion gap: 14 (ref 5–15)
BUN: 17 mg/dL (ref 8–23)
CO2: 29 mmol/L (ref 22–32)
Calcium: 7.6 mg/dL — ABNORMAL LOW (ref 8.9–10.3)
Chloride: 95 mmol/L — ABNORMAL LOW (ref 98–111)
Creatinine, Ser: 1.11 mg/dL — ABNORMAL HIGH (ref 0.44–1.00)
GFR, Estimated: 49 mL/min — ABNORMAL LOW (ref >60)
Glucose, Bld: 251 mg/dL — ABNORMAL HIGH (ref 70–99)
Potassium: 2.3 mmol/L — CL (ref 3.5–5.1)
Sodium: 138 mmol/L (ref 135–145)
Total Bilirubin: 1.1 mg/dL (ref 0.0–1.2)
Total Protein: 5.3 g/dL — ABNORMAL LOW (ref 6.5–8.1)

## 2023-10-16 LAB — CBC WITH DIFFERENTIAL/PLATELET
Abs Immature Granulocytes: 0.07 10*3/uL (ref 0.00–0.07)
Basophils Absolute: 0 10*3/uL (ref 0.0–0.1)
Basophils Relative: 0 %
Eosinophils Absolute: 0 10*3/uL (ref 0.0–0.5)
Eosinophils Relative: 0 %
HCT: 31.8 % — ABNORMAL LOW (ref 36.0–46.0)
Hemoglobin: 9.8 g/dL — ABNORMAL LOW (ref 12.0–15.0)
Immature Granulocytes: 1 %
Lymphocytes Relative: 7 %
Lymphs Abs: 0.8 10*3/uL (ref 0.7–4.0)
MCH: 31.8 pg (ref 26.0–34.0)
MCHC: 30.8 g/dL (ref 30.0–36.0)
MCV: 103.2 fL — ABNORMAL HIGH (ref 80.0–100.0)
Monocytes Absolute: 0.4 10*3/uL (ref 0.1–1.0)
Monocytes Relative: 3 %
Neutro Abs: 11 10*3/uL — ABNORMAL HIGH (ref 1.7–7.7)
Neutrophils Relative %: 89 %
Platelets: 203 10*3/uL (ref 150–400)
RBC: 3.08 MIL/uL — ABNORMAL LOW (ref 3.87–5.11)
RDW: 15.4 % (ref 11.5–15.5)
WBC: 12.3 10*3/uL — ABNORMAL HIGH (ref 4.0–10.5)
nRBC: 0 % (ref 0.0–0.2)

## 2023-10-16 LAB — LACTIC ACID, PLASMA: Lactic Acid, Venous: 1.6 mmol/L (ref 0.5–1.9)

## 2023-10-16 MED ORDER — POTASSIUM CHLORIDE 10 MEQ/100ML IV SOLN
10.0000 meq | Freq: Once | INTRAVENOUS | Status: AC
  Administered 2023-10-16: 10 meq via INTRAVENOUS
  Filled 2023-10-16: qty 100

## 2023-10-16 MED ORDER — MAGNESIUM OXIDE -MG SUPPLEMENT 400 (240 MG) MG PO TABS
800.0000 mg | ORAL_TABLET | Freq: Once | ORAL | Status: AC
  Administered 2023-10-16: 800 mg via ORAL
  Filled 2023-10-16: qty 2

## 2023-10-16 MED ORDER — POTASSIUM CHLORIDE CRYS ER 20 MEQ PO TBCR
40.0000 meq | EXTENDED_RELEASE_TABLET | Freq: Once | ORAL | Status: AC
  Administered 2023-10-16: 40 meq via ORAL
  Filled 2023-10-16: qty 2

## 2023-10-16 MED ORDER — LACTATED RINGERS IV BOLUS
1000.0000 mL | Freq: Once | INTRAVENOUS | Status: AC
  Administered 2023-10-16: 1000 mL via INTRAVENOUS

## 2023-10-16 MED ORDER — IOHEXOL 300 MG/ML  SOLN
100.0000 mL | Freq: Once | INTRAMUSCULAR | Status: AC | PRN
  Administered 2023-10-16: 100 mL via INTRAVENOUS

## 2023-10-16 MED ORDER — SODIUM CHLORIDE 0.9 % IV SOLN
2.0000 g | Freq: Once | INTRAVENOUS | Status: AC
  Administered 2023-10-16: 2 g via INTRAVENOUS
  Filled 2023-10-16: qty 20

## 2023-10-16 NOTE — ED Provider Notes (Signed)
 [facility] MEDICAL SURGICAL UNIT Provider Note  CSN: [id number] Arrival date & time: 10/16/23 1752  Chief Complaint(s) Weakness  HPI Misty Price is a 88 y.o. female with PMH HTN, vitamin B12 deficiency, dementia who presents emergency room for evaluation of generalized weakness.  History obtained from patient's daughter who states that she went to pick her up from her sister's place 5 hours away and she has had a progressive neurologic and functional decline.  She has a new wound on her buttocks and arrives hypoxic in the 80s.  Additional history unable to be obtained given patient's dementia.   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)    Unsteadiness on feet    Patient Active Problem List   Diagnosis Date Noted   Hypokalemia 10/17/2023   Urolithiasis 10/17/2023   Hyperlipidemia associated with type 2 diabetes mellitus (HCC) 07/19/2020   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) 07/19/2020   Aortic atherosclerosis (HCC) 07/19/2020   Protein-calorie malnutrition (HCC) 07/19/2020   Hyperlipidemia    Postoperative anemia due to acute blood loss    Closed comminuted intertrochanteric fracture of proximal end of left femur (HCC) 07/12/2020   Leukocytosis 07/12/2020   Hyperglycemia due to diabetes mellitus (HCC) 07/12/2020   Transaminitis 07/12/2020   Type 2 diabetes mellitus with hyperlipidemia (HCC) 09/27/2019   Essential hypertension 09/27/2019   Fall 09/26/2019   Dementia (HCC) 09/26/2019   Syncope 09/26/2019   Home Medication(s) Prior to Admission medications   Medication Sig Start Date End Date Taking?  Authorizing Provider  acetaminophen  (TYLENOL ) 325 MG tablet Take 2 tablets (650 mg total) by mouth every 6 (six) hours as needed for mild pain or fever. 07/18/20   [name], MD  aspirin  EC 81 MG EC tablet Take 1 tablet (81 mg total) by mouth 2 (two) times daily. Swallow whole. Patient taking differently: Take 81 mg by mouth daily. Swallow whole. 07/18/20   [name], MD  atorvastatin  (LIPITOR) 40 MG tablet atorvastatin  40 mg tablet  TAKE 1 TABLET BY MOUTH IN THE EVENING FOR HIGH CHOLESTEROL 09/05/20   [name], NP  donepezil  (ARICEPT ) 5 MG tablet  06/06/21   [provider]  insulin  glargine (LANTUS  SOLOSTAR) 100 UNIT/ML Solostar Pen Inject 11 Units into the skin at bedtime. Patient taking differently: Inject 15 Units into the skin at bedtime. 09/05/20   [name], NP  levETIRAcetam  (KEPPRA ) 500 MG tablet TAKE 1 TABLET BY MOUTH TWICE DAILY 10/07/22   [name], MD  linagliptin  (TRADJENTA ) 5 MG TABS tablet Take 1 tablet (5 mg total) by mouth daily. 09/05/20   [name], NP  lisinopril  (ZESTRIL ) 20 MG tablet Take 1 tablet (20 mg total) by mouth daily. 09/05/20   [name], NP  memantine  (NAMENDA ) 10 MG tablet Take 1 tablet (10 mg total) by mouth 2 (two) times daily. 09/05/20   [name], NP  NON FORMULARY Diet NAS, Consistent Carbohydrate 07/18/20   [provider]  sertraline  (ZOLOFT ) 100 MG tablet Take 1 tablet twice a day 07/10/21   [name],  MD  vitamin B-12 (CYANOCOBALAMIN ) 1000 MCG tablet Take 1,000 mcg by mouth daily.    [provider]                                                                                                                                    Past Surgical History Past Surgical History:  Procedure Laterality Date   INTRAMEDULLARY (IM) NAIL INTERTROCHANTERIC Left 07/13/2020   Procedure: INTRAMEDULLARY (IM) NAIL INTERTROCHANTRIC;  Surgeon: Tonita Frater, MD;  Location: [facility];  Service:  Orthopedics;  Laterality: Left;   JOINT REPLACEMENT     bilateral knee replacement   Family History Family History  Problem Relation Age of Onset   Hypertension Mother    Heart attack Father     Social History Social History   Tobacco Use   Smoking status: Former   Smokeless tobacco: Never  Advertising account planner   Vaping status: Never Used  Substance Use Topics   Alcohol use: Never   Drug use: Never   Allergies Patient has no active allergies.  Review of Systems Review of Systems  Unable to perform ROS: Dementia    Physical Exam Vital Signs  I have reviewed the triage vital signs BP (!) 107/94 (BP Location: Left Arm)   Pulse 92   Temp 98.8 F (37.1 C) (Oral)   Resp 16   Ht 5\' 5"  (1.651 m)   Wt 68 kg   SpO2 97%   BMI 24.96 kg/m   Physical Exam Vitals and nursing note reviewed.  Constitutional:      General: She is not in acute distress.    Appearance: She is well-developed. She is ill-appearing.  HENT:     Head: Normocephalic and atraumatic.  Eyes:     Conjunctiva/sclera: Conjunctivae normal.  Cardiovascular:     Rate and Rhythm: Normal rate and regular rhythm.     Heart sounds: No murmur heard. Pulmonary:     Effort: Pulmonary effort is normal. No respiratory distress.     Breath sounds: Normal breath sounds.  Abdominal:     Palpations: Abdomen is soft.     Tenderness: There is no abdominal tenderness.  Musculoskeletal:        General: No swelling.     Cervical back: Neck supple.  Skin:    General: Skin is warm and dry.     Capillary Refill: Capillary refill takes less than 2 seconds.     Findings: Lesion present.  Neurological:     Mental Status: She is alert.  Psychiatric:        Mood and Affect: Mood normal.      ED Results and Treatments Labs (all labs ordered are listed, but only abnormal results are displayed) Labs Reviewed  COMPREHENSIVE METABOLIC PANEL WITH GFR - Abnormal; Notable for the following components:      Result Value    Potassium 2.3 (*)    Chloride 95 (*)  Glucose, Bld 251 (*)    Creatinine, Ser 1.11 (*)    Calcium  7.6 (*)    Total Protein 5.3 (*)    Albumin 2.2 (*)    AST 13 (*)    GFR, Estimated 49 (*)    All other components within normal limits  CBC WITH DIFFERENTIAL/PLATELET - Abnormal; Notable for the following components:   WBC 12.3 (*)    RBC 3.08 (*)    Hemoglobin 9.8 (*)    HCT 31.8 (*)    MCV 103.2 (*)    Neutro Abs 11.0 (*)    All other components within normal limits  URINALYSIS, ROUTINE W REFLEX MICROSCOPIC - Abnormal; Notable for the following components:   Color, Urine AMBER (*)    APPearance CLOUDY (*)    Specific Gravity, Urine 1.041 (*)    Hgb urine dipstick LARGE (*)    Ketones, ur 20 (*)    Protein, ur 100 (*)    Leukocytes,Ua SMALL (*)    Bacteria, UA MANY (*)    All other components within normal limits  POTASSIUM - Abnormal; Notable for the following components:   Potassium 3.0 (*)    All other components within normal limits  BASIC METABOLIC PANEL WITH GFR - Abnormal; Notable for the following components:   Potassium 2.9 (*)    Glucose, Bld 193 (*)    Calcium  7.3 (*)    All other components within normal limits  CBC - Abnormal; Notable for the following components:   WBC 12.0 (*)    RBC 2.92 (*)    Hemoglobin 9.6 (*)    HCT 30.4 (*)    MCV 104.1 (*)    All other components within normal limits  URINE CULTURE  LACTIC ACID, PLASMA  MAGNESIUM                                                                                                                          Radiology CT CHEST ABDOMEN PELVIS W CONTRAST Result Date: 10/16/2023 CLINICAL DATA:  Sepsis EXAM: CT CHEST, ABDOMEN, AND PELVIS WITH CONTRAST TECHNIQUE: Multidetector CT imaging of the chest, abdomen and pelvis was performed following the standard protocol during bolus administration of intravenous contrast. RADIATION DOSE REDUCTION: This exam was performed according to the departmental  dose-optimization program which includes automated exposure control, adjustment of the mA and/or kV according to patient size and/or use of iterative reconstruction technique. CONTRAST:  100mL OMNIPAQUE IOHEXOL 300 MG/ML  SOLN COMPARISON:  None Available. FINDINGS: CT CHEST FINDINGS Cardiovascular: No significant vascular findings. Normal heart size. No pericardial effusion. There are atherosclerotic calcifications of the aorta and coronary arteries. Mediastinum/Nodes: No enlarged mediastinal, hilar, or axillary lymph nodes. Thyroid  gland, trachea, and esophagus demonstrate no significant findings. Lungs/Pleura: There are trace bilateral pleural effusions. There is mild atelectasis in the bilateral lower lobes and lingula. The lungs are otherwise clear. There secretions in the left mainstem bronchus. Musculoskeletal: There is mild compression deformity of T3 which is  age indeterminate, but favored as chronic. CT ABDOMEN PELVIS FINDINGS Hepatobiliary: No focal liver abnormality is seen. Status post cholecystectomy. No biliary dilatation. Pancreas: Unremarkable. No pancreatic ductal dilatation or surrounding inflammatory changes. Spleen: Normal in size without focal abnormality. Adrenals/Urinary Tract: There is a 4 mm calculus at the right ureterovesicular junction. There is mild to moderate right-sided hydroureteronephrosis. There is a 2 mm calculus in the left kidney. Otherwise, the left kidney is within normal limits. Adrenal glands and bladder are within normal limits. Stomach/Bowel: There is circumferential rectal wall thickening with presacral edema. There is no bowel obstruction, pneumatosis or free air. The appendix is within normal limits. There is questionable gastric antral wall thickening in there is wall thickening of the proximal duodenum. There is mild surrounding inflammatory stranding. The stomach is otherwise within normal limits. Vascular/Lymphatic: Aortic atherosclerosis. No enlarged abdominal or  pelvic lymph nodes. Reproductive: Uterus and bilateral adnexa are unremarkable. Other: There is no ascites. There is a small fat containing umbilical hernia. There are sutures along the anterior abdominal wall. Musculoskeletal: There are left superior and inferior pubic rami fractures which are likely subacute. Left-sided hip screw and intramedullary nail are present. There also bilateral sacral ala fractures which appear subacute or chronic. The bones are diffusely osteopenic. There is also fracture of the inferior endplate of S1 favored as subacute or chronic. No soft tissue fluid collection or gas. IMPRESSION: 1. 4 mm calculus at the right ureterovesicular junction with mild to moderate right-sided hydroureteronephrosis. 2. Nonobstructing left renal calculus. 3. Trace bilateral pleural effusions with mild bibasilar atelectasis. 4. Secretions in the left mainstem bronchus. 5. Circumferential rectal wall thickening with presacral edema compatible with proctitis. 6. Wall thickening of the proximal duodenum and gastric antrum with surrounding inflammatory stranding worrisome for duodenitis and gastritis. 7. Subacute or chronic fractures of the left superior and inferior pubic rami, bilateral sacral ala, and inferior endplate of S1. 8. Mild compression deformity of T3 is age indeterminate, but favored as chronic. 9. Aortic atherosclerosis. Aortic Atherosclerosis (ICD10-I70.0). Electronically Signed   By: Tyron Gallon M.D.   On: 10/16/2023 21:09    Pertinent labs & imaging results that were available during my care of the patient were reviewed by me and considered in my medical decision making (see MDM for details).  Medications Ordered in ED Medications  atorvastatin  (LIPITOR) tablet 40 mg (40 mg Oral Given 10/17/23 0848)  enoxaparin (LOVENOX) injection 40 mg (40 mg Subcutaneous Given 10/17/23 0849)  acetaminophen  (TYLENOL ) tablet 650 mg (650 mg Oral Patient Refused/Not Given 10/17/23 0848)    Or   acetaminophen  (TYLENOL ) suppository 650 mg ( Rectal See Alternative 10/17/23 0848)  ondansetron  (ZOFRAN ) tablet 4 mg (has no administration in time range)    Or  ondansetron  (ZOFRAN ) injection 4 mg (has no administration in time range)  pantoprazole (PROTONIX) injection 40 mg (40 mg Intravenous Given 10/17/23 0250)  dextrose  5 %-0.9 % sodium chloride  infusion ( Intravenous New Bag/Given 10/17/23 0247)  tamsulosin (FLOMAX) capsule 0.4 mg (0.4 mg Oral Given 10/17/23 0848)  pneumococcal 20-valent conjugate vaccine (PREVNAR 20) injection 0.5 mL (has no administration in time range)  leptospermum manuka honey (MEDIHONEY) paste 1 Application (has no administration in time range)  liver oil-zinc oxide (DESITIN) 40 % ointment (has no administration in time range)  cefTRIAXone (ROCEPHIN) 2 g in sodium chloride  0.9 % 100 mL IVPB (has no administration in time range)  potassium chloride  SA (KLOR-CON  M) CR tablet 40 mEq (40 mEq Oral Given 10/16/23 2050)  magnesium  oxide (MAG-OX) tablet 800 mg (800 mg Oral Given 10/16/23 2047)  potassium chloride  10 mEq in 100 mL IVPB (0 mEq Intravenous Stopped 10/16/23 2212)  iohexol (OMNIPAQUE) 300 MG/ML solution 100 mL (100 mLs Intravenous Contrast Given 10/16/23 2035)  lactated ringers  bolus 1,000 mL (0 mLs Intravenous Stopped 10/16/23 2346)  cefTRIAXone (ROCEPHIN) 2 g in sodium chloride  0.9 % 100 mL IVPB (0 g Intravenous Stopped 10/17/23 0020)  potassium chloride  10 mEq in 100 mL IVPB (10 mEq Intravenous New Bag/Given 10/17/23 0822)                                                                                                                                     Procedures .Critical Care  Performed by: Karlyn Overman, MD Authorized by: Karlyn Overman, MD   Critical care provider statement:    Critical care time (minutes):  30   Critical care was necessary to treat or prevent imminent or life-threatening deterioration of the following conditions:  Metabolic crisis and  respiratory failure   Critical care was time spent personally by me on the following activities:  Development of treatment plan with patient or surrogate, discussions with consultants, evaluation of patient's response to treatment, examination of patient, ordering and review of laboratory studies, ordering and review of radiographic studies, ordering and performing treatments and interventions, pulse oximetry, re-evaluation of patient's condition and review of old charts   (including critical care time)  Medical Decision Making / ED Course   This patient presents to the ED for concern of failure to thrive, generalized weakness, this involves an extensive number of treatment options, and is a complaint that carries with it a high risk of complications and morbidity.  The differential diagnosis includes infection, metabolic/toxic encephalopathy, hypoglycemia, malperfusion, hypoxia, trauma or other intracranial process  MDM: Patient seen emergency room for evaluation of failure to thrive and generalized weakness.  Physical exam reveals an ill-appearing patient with dry tacky mucous membrane and a developing wound over the sacrum as seen above.  Patient intermittently hypoxic requiring 2 L to maintain oxygen  saturations.  Laboratory evaluation with a leukocytosis to 12.3, hemoglobin 9.8, potassium severely low at 2.3 and aggressive IV and oral repletion has begun, creatinine 1.11, albumin 2.2.  Catheterized urine sample concerning for infection and ceftriaxone begun.  CT imaging concerning for a 4 mm calculus at the right UVJ with mild to moderate right-sided hydro, trace bilateral pleural effusions, secretions in the mainstem bronchus, rectal wall thickening concerning for proctitis, duodenal thickening concerning for duodenitis, chronic fractures of multiple pubic rami, chronic T3 fracture.  Spoke with Dr. Claretta Croft of urology about her infected urine and kidney stone and as patient is hemodynamically  stable with a normal creatinine normal lactic acid, patient okay to stay at [facility] and he will evaluate her in the morning.  Patient fluid resuscitated and will require hospital admission for UTI, failure to thrive and severe electrolyte derangements.  Additional history obtained: -Additional history obtained from multiple family members -External records from outside source obtained and reviewed including: Chart review including previous notes, labs, imaging, consultation notes   Lab Tests: -I ordered, reviewed, and interpreted labs.   The pertinent results include:   Labs Reviewed  COMPREHENSIVE METABOLIC PANEL WITH GFR - Abnormal; Notable for the following components:      Result Value   Potassium 2.3 (*)    Chloride 95 (*)    Glucose, Bld 251 (*)    Creatinine, Ser 1.11 (*)    Calcium  7.6 (*)    Total Protein 5.3 (*)    Albumin 2.2 (*)    AST 13 (*)    GFR, Estimated 49 (*)    All other components within normal limits  CBC WITH DIFFERENTIAL/PLATELET - Abnormal; Notable for the following components:   WBC 12.3 (*)    RBC 3.08 (*)    Hemoglobin 9.8 (*)    HCT 31.8 (*)    MCV 103.2 (*)    Neutro Abs 11.0 (*)    All other components within normal limits  URINALYSIS, ROUTINE W REFLEX MICROSCOPIC - Abnormal; Notable for the following components:   Color, Urine AMBER (*)    APPearance CLOUDY (*)    Specific Gravity, Urine 1.041 (*)    Hgb urine dipstick LARGE (*)    Ketones, ur 20 (*)    Protein, ur 100 (*)    Leukocytes,Ua SMALL (*)    Bacteria, UA MANY (*)    All other components within normal limits  POTASSIUM - Abnormal; Notable for the following components:   Potassium 3.0 (*)    All other components within normal limits  BASIC METABOLIC PANEL WITH GFR - Abnormal; Notable for the following components:   Potassium 2.9 (*)    Glucose, Bld 193 (*)    Calcium  7.3 (*)    All other components within normal limits  CBC - Abnormal; Notable for the following  components:   WBC 12.0 (*)    RBC 2.92 (*)    Hemoglobin 9.6 (*)    HCT 30.4 (*)    MCV 104.1 (*)    All other components within normal limits  URINE CULTURE  LACTIC ACID, PLASMA  MAGNESIUM       Imaging Studies ordered: I ordered imaging studies including CT chest abdomen pelvis I independently visualized and interpreted imaging. I agree with the radiologist interpretation   Medicines ordered and prescription drug management: Meds ordered this encounter  Medications   potassium chloride  SA (KLOR-CON  M) CR tablet 40 mEq   magnesium oxide (MAG-OX) tablet 800 mg   potassium chloride  10 mEq in 100 mL IVPB   iohexol (OMNIPAQUE) 300 MG/ML solution 100 mL   lactated ringers  bolus 1,000 mL   cefTRIAXone (ROCEPHIN) 2 g in sodium chloride  0.9 % 100 mL IVPB    Antibiotic Indication::   UTI   atorvastatin  (LIPITOR) tablet 40 mg    atorvastatin  40 mg tablet  TAKE 1 TABLET BY MOUTH IN THE EVENING FOR HIGH CHOLESTEROL     enoxaparin (LOVENOX) injection 40 mg   OR Linked Order Group    acetaminophen  (TYLENOL ) tablet 650 mg    acetaminophen  (TYLENOL ) suppository 650 mg   OR Linked Order Group    ondansetron  (ZOFRAN ) tablet 4 mg    ondansetron  (ZOFRAN ) injection 4 mg   pantoprazole (PROTONIX) injection 40 mg   dextrose  5 %-0.9 % sodium chloride  infusion   potassium chloride  10 mEq in  100 mL IVPB   DISCONTD: cefTRIAXone (ROCEPHIN) 1 g in sodium chloride  0.9 % 100 mL IVPB    Antibiotic Indication::   UTI   tamsulosin (FLOMAX) capsule 0.4 mg   pneumococcal 20-valent conjugate vaccine (PREVNAR 20) injection 0.5 mL   leptospermum manuka honey (MEDIHONEY) paste 1 Application   liver oil-zinc oxide (DESITIN) 40 % ointment   cefTRIAXone (ROCEPHIN) 2 g in sodium chloride  0.9 % 100 mL IVPB    Antibiotic Indication::   UTI    -I have reviewed the patients home medicines and have made adjustments as needed  Critical interventions Fluids, electrolyte repletion, supplemental  oxygen   Consultations Obtained: I requested consultation with the urologist on-call Dr. Claretta Croft,  and discussed lab and imaging findings as well as pertinent plan - they recommend: Evaluation in the morning, antibiotics   Cardiac Monitoring: The patient was maintained on a cardiac monitor.  I personally viewed and interpreted the cardiac monitored which showed an underlying rhythm of: NSR  Social Determinants of Health:  Factors impacting patients care include: Currently living at home, has been off of all medications for over a year   Reevaluation: After the interventions noted above, I reevaluated the patient and found that they have :improved  Co morbidities that complicate the patient evaluation  Past Medical History:  Diagnosis Date   Atherosclerosis    Cognitive communication deficit    Dementia (HCC)    Depression    Displaced intertrochanteric fracture of left femur (HCC)    History of falling    Hyperlipidemia    Hypertension    Moderate protein-calorie malnutrition (HCC)    Muscle weakness    Syncopal episodes    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

## 2023-10-16 NOTE — ED Notes (Signed)
 Pt requested water and EDP at bedside and agreed. Pt able to suck out of a straw and no choking or coughing noted.

## 2023-10-16 NOTE — ED Notes (Signed)
 Patient transported to CT

## 2023-10-16 NOTE — ED Triage Notes (Signed)
 Pt arrived [facility] from home with daughter. Pt just went to live with daughter a week ago. Pt has dementia and family is concerned about areas to buttocks.

## 2023-10-16 NOTE — ED Notes (Signed)
 Nasal cannula d/t pt on RA 88,89 % . 2lpm applied

## 2023-10-17 DIAGNOSIS — M84454A Pathological fracture, pelvis, initial encounter for fracture: Secondary | ICD-10-CM | POA: Diagnosis present

## 2023-10-17 DIAGNOSIS — Z6824 Body mass index (BMI) 24.0-24.9, adult: Secondary | ICD-10-CM | POA: Diagnosis not present

## 2023-10-17 DIAGNOSIS — R627 Adult failure to thrive: Secondary | ICD-10-CM | POA: Diagnosis present

## 2023-10-17 DIAGNOSIS — F0393 Unspecified dementia, unspecified severity, with mood disturbance: Secondary | ICD-10-CM | POA: Diagnosis present

## 2023-10-17 DIAGNOSIS — F32A Depression, unspecified: Secondary | ICD-10-CM | POA: Diagnosis present

## 2023-10-17 DIAGNOSIS — N136 Pyonephrosis: Secondary | ICD-10-CM | POA: Diagnosis present

## 2023-10-17 DIAGNOSIS — N200 Calculus of kidney: Secondary | ICD-10-CM | POA: Diagnosis present

## 2023-10-17 DIAGNOSIS — E876 Hypokalemia: Secondary | ICD-10-CM | POA: Diagnosis present

## 2023-10-17 DIAGNOSIS — M8448XA Pathological fracture, other site, initial encounter for fracture: Secondary | ICD-10-CM | POA: Diagnosis present

## 2023-10-17 DIAGNOSIS — E1149 Type 2 diabetes mellitus with other diabetic neurological complication: Secondary | ICD-10-CM | POA: Diagnosis present

## 2023-10-17 DIAGNOSIS — E7800 Pure hypercholesterolemia, unspecified: Secondary | ICD-10-CM | POA: Diagnosis present

## 2023-10-17 DIAGNOSIS — E441 Mild protein-calorie malnutrition: Secondary | ICD-10-CM | POA: Diagnosis present

## 2023-10-17 DIAGNOSIS — Z7984 Long term (current) use of oral hypoglycemic drugs: Secondary | ICD-10-CM | POA: Diagnosis not present

## 2023-10-17 DIAGNOSIS — N218 Other lower urinary tract calculus: Secondary | ICD-10-CM | POA: Diagnosis not present

## 2023-10-17 DIAGNOSIS — Z8249 Family history of ischemic heart disease and other diseases of the circulatory system: Secondary | ICD-10-CM | POA: Diagnosis not present

## 2023-10-17 DIAGNOSIS — R0902 Hypoxemia: Secondary | ICD-10-CM | POA: Diagnosis present

## 2023-10-17 DIAGNOSIS — Z87891 Personal history of nicotine dependence: Secondary | ICD-10-CM | POA: Diagnosis not present

## 2023-10-17 DIAGNOSIS — L89153 Pressure ulcer of sacral region, stage 3: Secondary | ICD-10-CM | POA: Diagnosis present

## 2023-10-17 DIAGNOSIS — Z993 Dependence on wheelchair: Secondary | ICD-10-CM | POA: Diagnosis not present

## 2023-10-17 DIAGNOSIS — E1169 Type 2 diabetes mellitus with other specified complication: Secondary | ICD-10-CM | POA: Diagnosis present

## 2023-10-17 DIAGNOSIS — I10 Essential (primary) hypertension: Secondary | ICD-10-CM | POA: Diagnosis present

## 2023-10-17 DIAGNOSIS — G9341 Metabolic encephalopathy: Secondary | ICD-10-CM | POA: Diagnosis present

## 2023-10-17 DIAGNOSIS — E440 Moderate protein-calorie malnutrition: Secondary | ICD-10-CM | POA: Diagnosis not present

## 2023-10-17 DIAGNOSIS — E860 Dehydration: Secondary | ICD-10-CM | POA: Diagnosis present

## 2023-10-17 DIAGNOSIS — Z794 Long term (current) use of insulin: Secondary | ICD-10-CM | POA: Diagnosis not present

## 2023-10-17 DIAGNOSIS — R531 Weakness: Secondary | ICD-10-CM | POA: Diagnosis present

## 2023-10-17 DIAGNOSIS — L8990 Pressure ulcer of unspecified site, unspecified stage: Secondary | ICD-10-CM | POA: Insufficient documentation

## 2023-10-17 DIAGNOSIS — E785 Hyperlipidemia, unspecified: Secondary | ICD-10-CM | POA: Diagnosis not present

## 2023-10-17 DIAGNOSIS — Z96653 Presence of artificial knee joint, bilateral: Secondary | ICD-10-CM | POA: Diagnosis present

## 2023-10-17 LAB — POTASSIUM: Potassium: 3 mmol/L — ABNORMAL LOW (ref 3.5–5.1)

## 2023-10-17 LAB — BASIC METABOLIC PANEL WITH GFR
Anion gap: 10 (ref 5–15)
BUN: 17 mg/dL (ref 8–23)
CO2: 28 mmol/L (ref 22–32)
Calcium: 7.3 mg/dL — ABNORMAL LOW (ref 8.9–10.3)
Chloride: 101 mmol/L (ref 98–111)
Creatinine, Ser: 0.83 mg/dL (ref 0.44–1.00)
GFR, Estimated: >60 mL/min (ref >60)
Glucose, Bld: 193 mg/dL — ABNORMAL HIGH (ref 70–99)
Potassium: 2.9 mmol/L — ABNORMAL LOW (ref 3.5–5.1)
Sodium: 139 mmol/L (ref 135–145)

## 2023-10-17 LAB — CBC
HCT: 30.4 % — ABNORMAL LOW (ref 36.0–46.0)
Hemoglobin: 9.6 g/dL — ABNORMAL LOW (ref 12.0–15.0)
MCH: 32.9 pg (ref 26.0–34.0)
MCHC: 31.6 g/dL (ref 30.0–36.0)
MCV: 104.1 fL — ABNORMAL HIGH (ref 80.0–100.0)
Platelets: 215 10*3/uL (ref 150–400)
RBC: 2.92 MIL/uL — ABNORMAL LOW (ref 3.87–5.11)
RDW: 15.1 % (ref 11.5–15.5)
WBC: 12 10*3/uL — ABNORMAL HIGH (ref 4.0–10.5)
nRBC: 0 % (ref 0.0–0.2)

## 2023-10-17 LAB — MAGNESIUM: Magnesium: 1.8 mg/dL (ref 1.7–2.4)

## 2023-10-17 MED ORDER — ACETAMINOPHEN 650 MG RE SUPP: 650.0000 mg | Freq: Four times a day (QID) | RECTAL | Status: DC | PRN

## 2023-10-17 MED ORDER — SODIUM CHLORIDE 0.9 % IV SOLN: 1.0000 g | INTRAVENOUS | Status: DC

## 2023-10-17 MED ORDER — PANTOPRAZOLE SODIUM 40 MG IV SOLR
40.0000 mg | INTRAVENOUS | Status: DC
  Administered 2023-10-17 – 2023-10-21 (×5): 40 mg via INTRAVENOUS
  Filled 2023-10-17 (×5): qty 10

## 2023-10-17 MED ORDER — OXYCODONE HCL 5 MG PO TABS
5.0000 mg | ORAL_TABLET | Freq: Four times a day (QID) | ORAL | Status: DC | PRN
  Administered 2023-10-17 – 2023-10-21 (×8): 5 mg via ORAL
  Filled 2023-10-17 (×8): qty 1

## 2023-10-17 MED ORDER — MAGNESIUM SULFATE 2 GM/50ML IV SOLN
2.0000 g | Freq: Once | INTRAVENOUS | Status: AC
  Administered 2023-10-17: 2 g via INTRAVENOUS
  Filled 2023-10-17: qty 50

## 2023-10-17 MED ORDER — ACETAMINOPHEN 325 MG PO TABS
650.0000 mg | ORAL_TABLET | Freq: Four times a day (QID) | ORAL | Status: DC | PRN
  Administered 2023-10-21: 650 mg via ORAL
  Filled 2023-10-17 (×2): qty 2

## 2023-10-17 MED ORDER — ENOXAPARIN SODIUM 40 MG/0.4ML IJ SOSY
40.0000 mg | PREFILLED_SYRINGE | INTRAMUSCULAR | Status: DC
  Administered 2023-10-17 – 2023-10-21 (×5): 40 mg via SUBCUTANEOUS
  Filled 2023-10-17 (×5): qty 0.4

## 2023-10-17 MED ORDER — FENTANYL CITRATE PF 50 MCG/ML IJ SOSY
12.5000 ug | PREFILLED_SYRINGE | INTRAMUSCULAR | Status: DC | PRN
  Administered 2023-10-17 – 2023-10-19 (×3): 12.5 ug via INTRAVENOUS
  Filled 2023-10-17 (×3): qty 1

## 2023-10-17 MED ORDER — MEDIHONEY WOUND/BURN DRESSING EX PSTE
1.0000 | PASTE | Freq: Every day | CUTANEOUS | Status: DC
  Administered 2023-10-17 – 2023-10-21 (×5): 1 via TOPICAL
  Filled 2023-10-17 (×2): qty 44

## 2023-10-17 MED ORDER — POTASSIUM CHLORIDE 10 MEQ/100ML IV SOLN
10.0000 meq | INTRAVENOUS | Status: AC
  Administered 2023-10-17 (×6): 10 meq via INTRAVENOUS
  Filled 2023-10-17 (×6): qty 100

## 2023-10-17 MED ORDER — POTASSIUM CHLORIDE CRYS ER 20 MEQ PO TBCR
40.0000 meq | EXTENDED_RELEASE_TABLET | Freq: Once | ORAL | Status: AC
  Administered 2023-10-17: 40 meq via ORAL
  Filled 2023-10-17: qty 2

## 2023-10-17 MED ORDER — TAMSULOSIN HCL 0.4 MG PO CAPS
0.4000 mg | ORAL_CAPSULE | Freq: Every day | ORAL | Status: DC
  Administered 2023-10-17 – 2023-10-21 (×5): 0.4 mg via ORAL
  Filled 2023-10-17 (×5): qty 1

## 2023-10-17 MED ORDER — PNEUMOCOCCAL 20-VAL CONJ VACC 0.5 ML IM SUSY
0.5000 mL | PREFILLED_SYRINGE | INTRAMUSCULAR | Status: DC
  Filled 2023-10-17: qty 0.5

## 2023-10-17 MED ORDER — ATORVASTATIN CALCIUM 40 MG PO TABS
40.0000 mg | ORAL_TABLET | Freq: Every day | ORAL | Status: DC
  Administered 2023-10-17 – 2023-10-21 (×5): 40 mg via ORAL
  Filled 2023-10-17 (×5): qty 1

## 2023-10-17 MED ORDER — ONDANSETRON HCL 4 MG/2ML IJ SOLN: 4.0000 mg | Freq: Four times a day (QID) | INTRAMUSCULAR | Status: DC | PRN

## 2023-10-17 MED ORDER — DEXTROSE-SODIUM CHLORIDE 5-0.9 % IV SOLN: INTRAVENOUS | Status: AC

## 2023-10-17 MED ORDER — ZINC OXIDE 40 % EX OINT
TOPICAL_OINTMENT | Freq: Two times a day (BID) | CUTANEOUS | Status: DC
  Administered 2023-10-17: 1 via TOPICAL
  Filled 2023-10-17: qty 57

## 2023-10-17 MED ORDER — ONDANSETRON HCL 4 MG PO TABS: 4.0000 mg | ORAL_TABLET | Freq: Four times a day (QID) | ORAL | Status: DC | PRN

## 2023-10-17 MED ORDER — SODIUM CHLORIDE 0.9 % IV SOLN
2.0000 g | INTRAVENOUS | Status: AC
Start: 2023-10-17 — End: 2023-10-20
  Administered 2023-10-17 – 2023-10-20 (×4): 2 g via INTRAVENOUS
  Filled 2023-10-17 (×4): qty 20

## 2023-10-17 NOTE — Assessment & Plan Note (Signed)
 Glucose is not elevated on admission Hold on insulin  coverage due to risk of hypoglycemia.  Continue close monitoring   Continue statin therapy

## 2023-10-17 NOTE — Assessment & Plan Note (Signed)
 Positive dehydration.   Plan to continue IV fluids with dextrose  and isotonic saline at 100 ml per hr Continue KCl correction IV. Not clear if patient can safely swallow.  Follow up renal function and electrolytes.

## 2023-10-17 NOTE — Assessment & Plan Note (Signed)
 Right obstructive stone,  complicated with urine infection (no sepsis)   Plan to continue broad spectrum antibiotic therapy with ceftriaxone.  Follow up cultures, cell count and temperature curve Add flomax  Follow up with urology recommendations

## 2023-10-17 NOTE — H&P (Addendum)
 History and Physical    Patient: Misty Price ZOX:[id number] DOB: 1935/09/23 DOA: 10/16/2023 DOS: the patient was seen and examined on 10/17/2023 PCP: Omie Bickers, MD  Patient coming from: Home  Chief Complaint:  Chief Complaint  Patient presents with   Weakness   HPI: Misty Price is a 88 y.o. female with medical history significant of advanced dementia, depression, T2DM and calorie protein malnutrition who presented with altered mental status.   For the last 5 days patient has been living with one of her daughters, prior to this she was under the care of other daughter. Unclear the patient's health status prior to last 5 days, but since she was moved, she was noted to have very poor oral intake. She has been very somnolent and hyporeactive, non verbal.  All the information is from her daughter at the beside, patient not able to give any history due to altered mental status and cognitive impairment.    Apparently her dementia is very severe, and she has been non ambulatory, wheelchair bound for 5 years.  She has been not taking her medications for long time. There is no evidence of pelvic pain or changes with her urination. No evidence of back pain. She has been non verbal.   Review of Systems: unable to review all systems due to the inability of the patient to answer questions. Past Medical History:  Diagnosis Date   Atherosclerosis    Cognitive communication deficit    Dementia (HCC)    Depression    Displaced intertrochanteric fracture of left femur (HCC)    History of falling    Hyperlipidemia    Hypertension    Moderate protein-calorie malnutrition (HCC)    Muscle weakness    Syncopal episodes    Type 2 diabetes mellitus with neurologic complication (HCC)    Unsteadiness on feet    Past Surgical History:  Procedure Laterality Date   INTRAMEDULLARY (IM) NAIL INTERTROCHANTERIC Left 07/13/2020   Procedure: INTRAMEDULLARY (IM) NAIL INTERTROCHANTRIC;  Surgeon: Tonita Frater, MD;  Location: [facility];  Service: Orthopedics;  Laterality: Left;   JOINT REPLACEMENT     bilateral knee replacement   Social History:  reports that she has quit smoking. She has never used smokeless tobacco. She reports that she does not drink alcohol and does not use drugs.  No Active Allergies  Family History  Problem Relation Age of Onset   Hypertension Mother    Heart attack Father     Prior to Admission medications   Medication Sig Start Date End Date Taking? Authorizing Provider  acetaminophen  (TYLENOL ) 325 MG tablet Take 2 tablets (650 mg total) by mouth every 6 (six) hours as needed for mild pain or fever. 07/18/20   [name], MD  aspirin  EC 81 MG EC tablet Take 1 tablet (81 mg total) by mouth 2 (two) times daily. Swallow whole. Patient taking differently: Take 81 mg by mouth daily. Swallow whole. 07/18/20   [name], MD  atorvastatin  (LIPITOR) 40 MG tablet atorvastatin  40 mg tablet  TAKE 1 TABLET BY MOUTH IN THE EVENING FOR HIGH CHOLESTEROL 09/05/20   [name], NP  donepezil  (ARICEPT ) 5 MG tablet  06/06/21   [provider]  insulin  glargine (LANTUS  SOLOSTAR) 100 UNIT/ML Solostar Pen Inject 11 Units into the skin at bedtime. Patient taking differently: Inject 15 Units into the skin at bedtime. 09/05/20   [name], NP  levETIRAcetam  (KEPPRA ) 500 MG tablet TAKE 1 TABLET BY MOUTH TWICE DAILY  10/07/22   [name], MD  linagliptin  (TRADJENTA ) 5 MG TABS tablet Take 1 tablet (5 mg total) by mouth daily. 09/05/20   [name], NP  lisinopril  (ZESTRIL ) 20 MG tablet Take 1 tablet (20 mg total) by mouth daily. 09/05/20   [name], NP  memantine  (NAMENDA ) 10 MG tablet Take 1 tablet (10 mg total) by mouth 2 (two) times daily. 09/05/20   [name], NP  NON FORMULARY Diet NAS, Consistent Carbohydrate 07/18/20   [provider]  sertraline  (ZOLOFT ) 100 MG tablet Take 1 tablet twice a day 07/10/21   [name], MD   vitamin B-12 (CYANOCOBALAMIN ) 1000 MCG tablet Take 1,000 mcg by mouth daily.    [provider]    Physical Exam: Vitals:   10/16/23 2100 10/16/23 2130 10/16/23 2200 10/16/23 2300  BP: 113/65 126/68 135/74 (!) 142/66  Pulse: 98 95 90 88  Resp: 20 18 19 17   Temp:   (!) 97.4 F (36.3 C)   TempSrc:   Oral   SpO2: 97% 100% 100% 100%  Weight:      Height:       Neurology patient with eyes closed, not responding to voice or touch. ENT with mild pallor with no icterus, oral mucosa dry Cardiovascular with S1 and S2 present and regular with no gallops, rubs or murmurs Respiratory with decreased breath sounds bilaterally with bilateral rhonchi and very poor inspiratory effort Abdomen is not distended, is soft with no rebound or guarding No lower extremity edema Positive stage 3 pressure ulcer sacrum and bilateral buttocks   Data Reviewed:   Na 138, K 2.3 Cl 95 bicarbonate 29 glucose 251, bun 17 cr 1,11 AST 13 ALT 10  Lactic acid 1,6  Wbc 12.3 hgb 9,8 plt 203   Urine analysis SG 1,041, protein 100, ketones 20, positive leukocytes and large hgb. > 50 wbc and > 50 rbc  CT chest, abdomen and pelvis, with 4 mm calculus at the right uretero-vesicular junction mild to moderate right sided hydronephrosis.  Non obstructing left renal calculus.  Trace bilateral pleural effusions with mild bibasilar atelectasis.  Secretions in the left main bronchus.  Circumferential rectal wall thickening with presacral edema compatible with proctitis Wall thickening of the proximal duodenum and gastric antrum with surrounding inflammatory stranding worrisome for duodenitis and gastritis Subacute or chronic fractures of the left superior and inferior pubic rami, bilateral sacral ala and inferior endplate of S1.  Mild compression deformity of T3 is age indeterminate but favored as chronic.   Assessment and Plan: * Hypokalemia Positive dehydration.   Plan to continue IV fluids with dextrose  and  isotonic saline at 100 ml per hr Continue KCl correction IV. Not clear if patient can safely swallow.  Follow up renal function and electrolytes.   Urolithiasis Right obstructive stone,  complicated with urine infection (no sepsis)   Plan to continue broad spectrum antibiotic therapy with ceftriaxone .  Follow up cultures, cell count and temperature curve Add flomax   Follow up with urology recommendations   Essential hypertension Continue blood pressure monitoring Hold on antihypertensive medications due to risk of hypotension   Type 2 diabetes mellitus with hyperlipidemia (HCC) Glucose is not elevated on admission Hold on insulin  coverage due to risk of hypoglycemia.  Continue close monitoring   Continue statin therapy   Depression due to dementia [facility]) Patient with acute metabolic encephalopathy, possible hypoactive delirium.   Plan to hold on psychotropic medications for now.  Continue neuro checks  per unit protocol Fall and aspiration precautions Consult speech for swallow evaluation.    Positive sacrum pressure ulcer stage 3 present on admission, consult wound care.   Multiple chronic fractures, pelvic and spine, very poor prognosis.  Consult palliative care.   Protein-calorie malnutrition (HCC) Consult nutrition for evaluation and supplements.       Advance Care Planning:   Code Status: Full Code   Consults: urology per ED   Family Communication: I spoke with patient's daughter at the bedside, we talked in detail about patient's condition, plan of care and prognosis and all questions were addressed.   Severity of Illness: The appropriate patient status for this patient is OBSERVATION. Observation status is judged to be reasonable and necessary in order to provide the required intensity of service to ensure the patient's safety. The patient's presenting symptoms, physical exam findings, and initial radiographic and laboratory data in the context of their medical  condition is felt to place them at decreased risk for further clinical deterioration. Furthermore, it is anticipated that the patient will be medically stable for discharge from the hospital within 2 midnights of admission.   Author: Albertus Alt, MD 10/17/2023 12:15 AM  For on call review [email or web address].

## 2023-10-17 NOTE — Evaluation (Signed)
 Clinical/Bedside Swallow Evaluation Patient Details  Name: Misty Price MRN: [id number] Date of Birth: March 22, 1936  Today's Date: 10/17/2023 Time: SLP Start Time (ACUTE ONLY): 1505 SLP Stop Time (ACUTE ONLY): 1524 SLP Time Calculation (min) (ACUTE ONLY): 19 min  Past Medical History:  Past Medical History:  Diagnosis Date   Atherosclerosis    Cognitive communication deficit    Dementia (HCC)    Depression    Displaced intertrochanteric fracture of left femur (HCC)    History of falling    Hyperlipidemia    Hypertension    Moderate protein-calorie malnutrition (HCC)    Muscle weakness    Syncopal episodes    Type 2 diabetes mellitus with neurologic complication (HCC)    Unsteadiness on feet    Past Surgical History:  Past Surgical History:  Procedure Laterality Date   INTRAMEDULLARY (IM) NAIL INTERTROCHANTERIC Left 07/13/2020   Procedure: INTRAMEDULLARY (IM) NAIL INTERTROCHANTRIC;  Surgeon: Tonita Frater, MD;  Location: [facility];  Service: Orthopedics;  Laterality: Left;   JOINT REPLACEMENT     bilateral knee replacement   HPI:  88 y.o. female with medical history significant of advanced dementia, depression, T2DM and calorie protein malnutrition who presented with altered mental status. Since moving in with daughter currently staying with, pt reportedly has decreased intake, somnolance.    Assessment / Plan / Recommendation  Clinical Impression  Pt presents with overall functional and safe oropharyngeal swallow, as best determined at bedside. OME completed, though limited. Pt presenting with intact ROM of lips, mandible, and tongue. Mildly reduced lingual strength. Pt with clear, strong vocal quality. She accepted PO trials of thin liquids via straw, puree, soft solids, regular solids. Oral phase intact with timely mastication, minimal oral residue given dry solids. She utilized liquid wash to fully clear oral cavity. No overt s/sx aspiration. PT maintains clear vocal quality  and no overt change in breathing. She did consistantly report everything tasted bad, making faces which suggests potential dysgeusia. This may be impacting overall intake and may resolve with increased hydration. Recommend full range of solids to maximize options available in hopes pt will increase intake. No further ST is indicated at this time, please reconsult if needs change. SLP Visit Diagnosis: Dysphagia, unspecified (R13.10)    Aspiration Risk  Mild aspiration risk    Diet Recommendation Regular;Thin liquid    Liquid Administration via: Cup;Straw Medication Administration: Whole meds with puree Supervision: Patient able to self feed    Other  Recommendations Oral Care Recommendations: Oral care BID    Recommendations for follow up therapy are one component of a multi-disciplinary discharge planning process, led by the attending physician.  Recommendations may be updated based on patient status, additional functional criteria and insurance authorization.  Follow up Recommendations No SLP follow up      Assistance Recommended at Discharge    Functional Status Assessment Patient has not had a recent decline in their functional status  Frequency and Duration Other (Comment) (eval and discharge)          Prognosis        Swallow Study   General Date of Onset: 10/16/23 HPI: 88 y.o. female with medical history significant of advanced dementia, depression, T2DM and calorie protein malnutrition who presented with altered mental status. Since moving in with daughter currently staying with, pt reportedly has decreased intake, somnolance. Type of Study: Bedside Swallow Evaluation Previous Swallow Assessment: 2022 MBSS demonstrating functional and safe swallow Diet Prior to this Study: Regular;Thin liquids (Level 0) Temperature Spikes  Noted: No Respiratory Status: Room air History of Recent Intubation: No Behavior/Cognition: Confused;Cooperative Oral Cavity Assessment: Within  Functional Limits Oral Care Completed by SLP: Yes Oral Cavity - Dentition: Adequate natural dentition Vision: Functional for self-feeding Self-Feeding Abilities: Able to feed self Patient Positioning: Upright in bed Baseline Vocal Quality: Normal Volitional Cough: Weak Volitional Swallow: Able to elicit    Oral/Motor/Sensory Function Overall Oral Motor/Sensory Function: Generalized oral weakness   Ice Chips Ice chips: Within functional limits Presentation: Spoon   Thin Liquid Thin Liquid: Within functional limits Presentation: Spoon    Nectar Thick Nectar Thick Liquid: Not tested   Honey Thick Honey Thick Liquid: Not tested   Puree Puree: Within functional limits   Solid     Solid: Within functional limits Presentation: Self Fed Other Comments:  (mildly prolonged mastication of dry solids, likely 2/2 xerostomia in setting of dehydration. timely mastication demonstrated with moistened soft solid (diced fruit))      Misty Price 10/17/2023,3:41 PM

## 2023-10-17 NOTE — TOC CM/SW Note (Signed)
 Transition of Care [facility]) - Inpatient Brief Assessment   Patient Details  Name: Misty Price MRN: [id number] Date of Birth: [birth year]  Transition of Care [facility] CM/SW Contact:    Grandville Lax, LCSWA Phone Number: 10/17/2023, 3:08 PM   Clinical Narrative: Transition of Care Department [facility]) has reviewed patient and no TOC needs have been identified at this time. We will continue to monitor patient advancement through interdiciplinary progression rounds. If new patient transition needs arise, please place a TOC consult.   Transition of Care Asessment: Insurance and Status: Insurance coverage has been reviewed Patient has primary care physician: Yes Home environment has been reviewed: From home with daughter Prior level of function:: Needs assistance Prior/Current Home Services: No current home services Social Drivers of Health Review: SDOH reviewed no interventions necessary Readmission risk has been reviewed: Yes Transition of care needs: no transition of care needs at this time

## 2023-10-17 NOTE — Progress Notes (Signed)
 OT Cancellation Note  Patient Details Name: Denetria Luevanos MRN: [id number] DOB: 1935/12/11   Cancelled Treatment:    Reason Eval/Treat Not Completed: Other (comment) (Pt screaming whenever therapist touched her leg and frequnetly askign where her dauighter is. Pt wheelchair bound at baseline.) Will attempt to see pt when daughter is present to see if this affects participation.  Rawleigh Cadet, OTR/L  10/17/2023, 9:30 AM

## 2023-10-17 NOTE — Progress Notes (Signed)
 Patient turned and repositioned during shift, crying out for family members at times. Given PRN fentanyl  IV without much relief , given Oxy PO 5mg  seemed to work a little better . Appetite poor .

## 2023-10-17 NOTE — Progress Notes (Signed)
 ASSUMPTION OF CARE NOTE   10/17/2023 12:49 PM  Misty Price was seen and examined.  The H&P by the admitting provider, orders, imaging was reviewed.  Please see new orders.  Will continue to follow.    88 y.o. female with medical history significant of advanced dementia, depression, T2DM and calorie protein malnutrition who presented with altered mental status.    For the last 5 days patient has been living with one of her daughters, prior to this she was under the care of other daughter. Unclear the patient's health status prior to last 5 days, but since she was moved, she was noted to have very poor oral intake. She has been very somnolent and hyporeactive, non verbal.  All the information is from her daughter at the beside, patient not able to give any history due to altered mental status and cognitive impairment.     Apparently her dementia is very severe, and she has been non ambulatory, wheelchair bound for 5 years.  She has been not taking her medications for long time. There is no evidence of pelvic pain or changes with her urination. No evidence of back pain. She has been non verbal.    Vitals:   10/17/23 0606 10/17/23 1245  BP: (!) 107/94 (!) 106/43  Pulse: 92 99  Resp: 16   Temp: 98.8 F (37.1 C) 98 F (36.7 C)  SpO2: 97% 91%    Results for orders placed or performed during the hospital encounter of 10/16/23  Comprehensive metabolic panel   Collection Time: 10/16/23  7:18 PM  Result Value Ref Range   Sodium 138 135 - 145 mmol/L   Potassium 2.3 (LL) 3.5 - 5.1 mmol/L   Chloride 95 (L) 98 - 111 mmol/L   CO2 29 22 - 32 mmol/L   Glucose, Bld 251 (H) 70 - 99 mg/dL   BUN 17 8 - 23 mg/dL   Creatinine, Ser 1.32 (H) 0.44 - 1.00 mg/dL   Calcium  7.6 (L) 8.9 - 10.3 mg/dL   Total Protein 5.3 (L) 6.5 - 8.1 g/dL   Albumin 2.2 (L) 3.5 - 5.0 g/dL   AST 13 (L) 15 - 41 U/L   ALT 10 0 - 44 U/L   Alkaline Phosphatase 104 38 - 126 U/L   Total Bilirubin 1.1 0.0 - 1.2 mg/dL   GFR,  Estimated 49 (L) >60 mL/min   Anion gap 14 5 - 15  CBC with Differential   Collection Time: 10/16/23  7:18 PM  Result Value Ref Range   WBC 12.3 (H) 4.0 - 10.5 K/uL   RBC 3.08 (L) 3.87 - 5.11 MIL/uL   Hemoglobin 9.8 (L) 12.0 - 15.0 g/dL   HCT 44.0 (L) 10.2 - 72.5 %   MCV 103.2 (H) 80.0 - 100.0 fL   MCH 31.8 26.0 - 34.0 pg   MCHC 30.8 30.0 - 36.0 g/dL   RDW 36.6 44.0 - 34.7 %   Platelets 203 150 - 400 K/uL   nRBC 0.0 0.0 - 0.2 %   Neutrophils Relative % 89 %   Neutro Abs 11.0 (H) 1.7 - 7.7 K/uL   Lymphocytes Relative 7 %   Lymphs Abs 0.8 0.7 - 4.0 K/uL   Monocytes Relative 3 %   Monocytes Absolute 0.4 0.1 - 1.0 K/uL   Eosinophils Relative 0 %   Eosinophils Absolute 0.0 0.0 - 0.5 K/uL   Basophils Relative 0 %   Basophils Absolute 0.0 0.0 - 0.1 K/uL   Immature Granulocytes 1 %  Abs Immature Granulocytes 0.07 0.00 - 0.07 K/uL  Lactic acid, plasma   Collection Time: 10/16/23  7:18 PM  Result Value Ref Range   Lactic Acid, Venous 1.6 0.5 - 1.9 mmol/L  Urinalysis, Routine w reflex microscopic -Urine, Catheterized   Collection Time: 10/16/23 10:10 PM  Result Value Ref Range   Color, Urine AMBER (A) YELLOW   APPearance CLOUDY (A) CLEAR   Specific Gravity, Urine 1.041 (H) 1.005 - 1.030   pH 5.0 5.0 - 8.0   Glucose, UA NEGATIVE NEGATIVE mg/dL   Hgb urine dipstick LARGE (A) NEGATIVE   Bilirubin Urine NEGATIVE NEGATIVE   Ketones, ur 20 (A) NEGATIVE mg/dL   Protein, ur 161 (A) NEGATIVE mg/dL   Nitrite NEGATIVE NEGATIVE   Leukocytes,Ua SMALL (A) NEGATIVE   RBC / HPF >50 0 - 5 RBC/hpf   WBC, UA >50 0 - 5 WBC/hpf   Bacteria, UA MANY (A) NONE SEEN   Squamous Epithelial / HPF 0-5 0 - 5 /HPF   WBC Clumps PRESENT    Mucus PRESENT   Potassium   Collection Time: 10/17/23 12:41 AM  Result Value Ref Range   Potassium 3.0 (L) 3.5 - 5.1 mmol/L  Basic metabolic panel   Collection Time: 10/17/23  4:21 AM  Result Value Ref Range   Sodium 139 135 - 145 mmol/L   Potassium 2.9 (L) 3.5 -  5.1 mmol/L   Chloride 101 98 - 111 mmol/L   CO2 28 22 - 32 mmol/L   Glucose, Bld 193 (H) 70 - 99 mg/dL   BUN 17 8 - 23 mg/dL   Creatinine, Ser 0.96 0.44 - 1.00 mg/dL   Calcium  7.3 (L) 8.9 - 10.3 mg/dL   GFR, Estimated >04 >54 mL/min   Anion gap 10 5 - 15  CBC   Collection Time: 10/17/23  4:21 AM  Result Value Ref Range   WBC 12.0 (H) 4.0 - 10.5 K/uL   RBC 2.92 (L) 3.87 - 5.11 MIL/uL   Hemoglobin 9.6 (L) 12.0 - 15.0 g/dL   HCT 09.8 (L) 11.9 - 14.7 %   MCV 104.1 (H) 80.0 - 100.0 fL   MCH 32.9 26.0 - 34.0 pg   MCHC 31.6 30.0 - 36.0 g/dL   RDW 82.9 56.2 - 13.0 %   Platelets 215 150 - 400 K/uL   nRBC 0.0 0.0 - 0.2 %  Magnesium   Collection Time: 10/17/23  4:21 AM  Result Value Ref Range   Magnesium 1.8 1.7 - 2.4 mg/dL   Olga Berthold, MD [facility]   10/16/2023  5:57 PM How to contact the [facility] Attending or Consulting provider 7A - 7P or covering provider during after hours 7P -7A, for this patient?  Check the care team in [facility] and look for a) attending/consulting [facility] provider listed and b) the [facility] team listed Log into [email or web address] and use [facility] universal password to access. If you do not have the password, please contact the hospital operator. Locate the [facility] provider you are looking for under [facility] and page to a number that you can be directly reached. If you still have difficulty reaching the provider, please page the [facility] (Director on Call) for the Hospitalists listed on amion for assistance.

## 2023-10-17 NOTE — Consult Note (Signed)
 WOC Nurse Consult Note: Reason for Consult: sacral wound  Wound type: Deep Tissue Pressure Injury that is evolving to Stage 3  Pressure Injury POA: Yes Measurement: see nursing flowsheet  Wound bed: purple maroon discoloration to sacrum/upper buttocks that is evolving with some areas now presenting as Stage 3, some with evolving soft eschar  Drainage (amount, consistency, odor) see nursing flowsheet  Periwound: erythema to buttocks  Dressing procedure/placement/frequency:  Cleanse buttocks/sacrum with Vashe wound cleanser Timm Foot [phone]) do not rinse and allow to air dry. Apply Medihoney to wound beds daily, cover with dry gauze and silicone foam.    Patient should be placed on a low air loss mattress for pressure redistribution and moisture management.   This is a Deep Tissue Pressure Injury and is at high risk for deterioration. If patient returning to home would benefit from home health to follow or wound care center referral for ongoing management.   POC discussed with bedside nurse. WOC team will not follow. Re-consult if further needs arise.   Thank you,    Ronni Colace MSN, RN-BC, [employer] [phone]

## 2023-10-17 NOTE — Hospital Course (Addendum)
 88 y.o. female with medical history significant of advanced dementia, depression, T2DM and calorie protein malnutrition who presented with altered mental status.    For the last 5 days patient has been living with one of her daughters, prior to this she was under the care of other daughter. Unclear the patient's health status prior to last 5 days, but since she was moved, she was noted to have very poor oral intake. She has been very somnolent and hyporeactive, non verbal.  All the information is from her daughter at the beside, patient not able to give any history due to altered mental status and cognitive impairment.     Apparently her dementia is very severe, and she has been non ambulatory, wheelchair bound for 5 years.  She has been not taking her medications for long time. There is no evidence of pelvic pain or changes with her urination. No evidence of back pain. She has been non verbal.

## 2023-10-17 NOTE — Assessment & Plan Note (Signed)
 Consult nutrition for evaluation and supplements.

## 2023-10-17 NOTE — Assessment & Plan Note (Addendum)
 Patient with acute metabolic encephalopathy, possible hypoactive delirium.   Plan to hold on psychotropic medications for now.  Continue neuro checks per unit protocol Fall and aspiration precautions Consult speech for swallow evaluation.    Positive sacrum pressure ulcer stage 3 present on admission, consult wound care.   Multiple chronic fractures, pelvic and spine, very poor prognosis.  Consult palliative care.

## 2023-10-17 NOTE — Progress Notes (Signed)
 PT Cancellation Note  Patient Details Name: Misty Price MRN: [id number] DOB: [birth year]   Cancelled Treatment:    Reason Eval/Treat Not Completed: Other (comment) PT/OT evaluation attempted. Patient very confused (advanced dementia). Patient screaming and stating "she's hurting me" to very light touch to LE. Patient repeatedly asking where daughter is throughout. Per medical note, patient has been w/c/bed bound for at least 5 years, would assume max/total assist needed. Will attempt again at later time/date.   10:15 AM, 10/17/23 Marysue Sola, PT, DPT [place] with [facility]

## 2023-10-17 NOTE — Assessment & Plan Note (Signed)
 Continue blood pressure monitoring Hold on antihypertensive medications due to risk of hypotension

## 2023-10-17 NOTE — Consult Note (Signed)
 Urology Consult  Referring physician: Dr. Lincoln Renshaw Reason for referral: right ureteral calculus  Chief Complaint: right flank pain  History of Present Illness: Ms Griffiths is a 88yo with a history of dementia, DMII wjo presented to the ER with confusion and failure to thrive/poor PO intake. On presentation to the ER she underwent CT which showed a 3mm right distal ureteral calculus with moderate hydronephrosis. No perinephric stranding. Patient is afebrile, WBC count 12, UA shows RBCs, WBCs, bacteria. She denies any flank pain. She denies any significant LUTS. She is incontinent at baseline and has multiple pressure ulcers. N other associated symptoms. No exacerbating events.   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 Allergies: No Active Allergies  Family History  Problem Relation Age of Onset   Hypertension Mother    Heart attack Father    Social History:  reports that she has quit smoking. She has never used smokeless tobacco. She reports that she does not drink alcohol and does not use drugs.  Review of Systems  Unable to perform ROS: Dementia    Physical Exam:  Vital signs in last 24 hours: Temp:  [97.4 F (36.3 C)-99.3 F (37.4  C)] 98.8 F (37.1 C) (05/23 0606) Pulse Rate:  [69-98] 92 (05/23 0606) Resp:  [13-20] 16 (05/23 0606) BP: (107-152)/(48-99) 107/94 (05/23 0606) SpO2:  [89 %-100 %] 97 % (05/23 0606) Weight:  [68 kg] 68 kg (05/22 1817) Physical Exam Vitals reviewed.  HENT:     Head: Normocephalic and atraumatic.  Eyes:     Extraocular Movements: Extraocular movements intact.     Pupils: Pupils are equal, round, and reactive to light.  Cardiovascular:     Rate and Rhythm: Normal rate and regular rhythm.  Pulmonary:     Effort: Pulmonary effort is normal.     Breath sounds: Normal breath sounds.  Abdominal:     General: Abdomen is flat. There is no distension.  Musculoskeletal:        General: No swelling. Normal range of motion.     Cervical back: Normal range of motion and neck supple.  Skin:    General: Skin is warm and dry.  Neurological:     Mental Status: She is alert. She is disoriented.     Laboratory Data:  Results for orders placed or performed during the hospital encounter of 10/16/23 (from the past 72 hours)  Comprehensive metabolic panel     Status: Abnormal   Collection Time: 10/16/23  7:18 PM  Result Value Ref Range   Sodium 138 135 - 145 mmol/L   Potassium 2.3 (LL) 3.5 - 5.1 mmol/L    Comment: CRITICAL RESULT CALLED TO, READ BACK BY AND VERIFIED WITH  CRIDER,C ON 10/16/23 AT 2012 BY LOY,C   Chloride 95 (L) 98 - 111 mmol/L   CO2 29 22 - 32 mmol/L   Glucose, Bld 251 (H) 70 - 99 mg/dL    Comment: Glucose reference range applies only to samples taken after fasting for at least 8 hours.   BUN 17 8 - 23 mg/dL   Creatinine, Ser 1.61 (H) 0.44 - 1.00 mg/dL   Calcium  7.6 (L) 8.9 - 10.3 mg/dL   Total Protein 5.3 (L) 6.5 - 8.1 g/dL   Albumin 2.2 (L) 3.5 - 5.0 g/dL   AST 13 (L) 15 - 41 U/L   ALT 10 0 - 44 U/L   Alkaline Phosphatase 104 38 - 126 U/L   Total Bilirubin 1.1 0.0 - 1.2 mg/dL   GFR, Estimated 49 (L) >60 mL/min    Comment: (NOTE) Calculated using the CKD-EPI Creatinine  Equation (2021)    Anion gap 14 5 - 15    Comment: Performed at [facility], [address] [place], [place] [address]  CBC with Differential     Status: Abnormal   Collection Time: 10/16/23  7:18 PM  Result Value Ref Range   WBC 12.3 (H) 4.0 - 10.5 K/uL   RBC 3.08 (L) 3.87 - 5.11 MIL/uL   Hemoglobin 9.8 (L) 12.0 - 15.0 g/dL   HCT 54.0 (L) 98.1 - 19.1 %   MCV 103.2 (H) 80.0 - 100.0 fL   MCH 31.8 26.0 - 34.0 pg   MCHC 30.8 30.0 - 36.0 g/dL   RDW 47.8 29.5 - 62.1 %   Platelets 203 150 - 400 K/uL   nRBC 0.0 0.0 - 0.2 %   Neutrophils Relative % 89 %   Neutro Abs 11.0 (H) 1.7 - 7.7 K/uL   Lymphocytes Relative 7 %   Lymphs Abs 0.8 0.7 - 4.0 K/uL   Monocytes Relative 3 %   Monocytes Absolute 0.4 0.1 - 1.0 K/uL   Eosinophils Relative 0 %   Eosinophils Absolute 0.0 0.0 - 0.5 K/uL   Basophils Relative 0 %   Basophils Absolute 0.0 0.0 - 0.1 K/uL   Immature Granulocytes 1 %   Abs Immature Granulocytes 0.07 0.00 - 0.07 K/uL    Comment: Performed at [facility], [address] [place], [place] [address]  Lactic acid, plasma     Status: None   Collection Time: 10/16/23  7:18 PM  Result Value Ref Range   Lactic Acid, Venous 1.6 0.5 - 1.9 mmol/L    Comment: Performed at [facility], [address] [place], [place] [address]  Urinalysis, Routine w reflex microscopic -Urine, Catheterized     Status: Abnormal   Collection Time: 10/16/23 10:10 PM  Result Value Ref Range   Color, Urine AMBER (A) YELLOW    Comment: BIOCHEMICALS MAY BE AFFECTED BY COLOR   APPearance CLOUDY (A) CLEAR   Specific Gravity, Urine 1.041 (H) 1.005 - 1.030   pH 5.0 5.0 - 8.0   Glucose, UA NEGATIVE NEGATIVE mg/dL   Hgb urine dipstick LARGE (A) NEGATIVE   Bilirubin Urine NEGATIVE NEGATIVE   Ketones, ur 20 (A) NEGATIVE mg/dL   Protein, ur 629 (A) NEGATIVE mg/dL   Nitrite NEGATIVE NEGATIVE   Leukocytes,Ua SMALL (A) NEGATIVE   RBC / HPF >50 0 - 5 RBC/hpf   WBC, UA >50 0 - 5 WBC/hpf   Bacteria, UA MANY (A) NONE SEEN    Squamous Epithelial / HPF 0-5 0 - 5 /HPF   WBC Clumps  PRESENT    Mucus PRESENT     Comment: Performed at [facility], [address] [place], [place] [address]  Potassium     Status: Abnormal   Collection Time: 10/17/23 12:41 AM  Result Value Ref Range   Potassium 3.0 (L) 3.5 - 5.1 mmol/L    Comment: Performed at [facility], [address] [place], [place] [address]  Basic metabolic panel     Status: Abnormal   Collection Time: 10/17/23  4:21 AM  Result Value Ref Range   Sodium 139 135 - 145 mmol/L   Potassium 2.9 (L) 3.5 - 5.1 mmol/L   Chloride 101 98 - 111 mmol/L   CO2 28 22 - 32 mmol/L   Glucose, Bld 193 (H) 70 - 99 mg/dL    Comment: Glucose reference range applies only to samples taken after fasting for at least 8 hours.   BUN 17 8 - 23 mg/dL   Creatinine, Ser 3.08 0.44 - 1.00 mg/dL   Calcium  7.3 (L) 8.9 - 10.3 mg/dL   GFR, Estimated >65 >78 mL/min    Comment: (NOTE) Calculated using the CKD-EPI Creatinine Equation (2021)    Anion gap 10 5 - 15    Comment: Performed at [facility], [address] [place], [place] [address]  CBC     Status: Abnormal   Collection Time: 10/17/23  4:21 AM  Result Value Ref Range   WBC 12.0 (H) 4.0 - 10.5 K/uL   RBC 2.92 (L) 3.87 - 5.11 MIL/uL   Hemoglobin 9.6 (L) 12.0 - 15.0 g/dL   HCT 95.2 (L) 84.1 - 32.4 %   MCV 104.1 (H) 80.0 - 100.0 fL   MCH 32.9 26.0 - 34.0 pg   MCHC 31.6 30.0 - 36.0 g/dL   RDW 40.1 02.7 - 25.3 %   Platelets 215 150 - 400 K/uL   nRBC 0.0 0.0 - 0.2 %    Comment: Performed at [facility], [address], [place], [place] [address]  Magnesium     Status: None   Collection Time: 10/17/23  4:21 AM  Result Value Ref Range   Magnesium 1.8 1.7 - 2.4 mg/dL    Comment: Performed at [facility], [address] [place], [place] [address]   No results found for this or any previous visit (from the past 240 hours). Creatinine: Recent Labs    10/16/23 1918 10/17/23 0421  CREATININE 1.11* 0.83   Baseline  Creatinine: 0.8  Impression/Assessment:  88yo with right ureteral calculus, UTI  Plan:  UTI: Please continue broad spectrum antibiotics pending her urine culture Right ureteral calculus: -We discussed the management of kidney stones. These options include observation, ureteroscopy, shockwave lithotripsy (ESWL) and percutaneous nephrolithotomy (PCNL). We discussed which options are relevant to the patient's stone(s). We discussed the natural history of kidney stones as well as the complications of untreated stones and the impact on quality of life without treatment as well as with each of the above listed treatments. We also discussed the efficacy of each treatment in its ability to clear the stone burden. With any of these management options I discussed the signs and symptoms of infection and the need for emergent treatment should these be experienced. For each option we discussed the ability of each procedure to clear the patient of their stone burden.   For observation I described the risks which include but are not limited to silent renal damage, life-threatening infection, need for emergent surgery, failure to pass stone and pain.  For ureteroscopy I described the risks which include bleeding, infection, damage to contiguous structures, positioning injury, ureteral stricture, ureteral avulsion, ureteral injury, need for prolonged ureteral stent, inability to perform ureteroscopy, need for an interval procedure, inability to clear stone burden, stent discomfort/pain, heart attack, stroke, pulmonary embolus and the inherent risks with general anesthesia.   For shockwave lithotripsy I described the risks which include arrhythmia, kidney contusion, kidney hemorrhage, need for transfusion, pain, inability to adequately break up stone, inability to pass stone fragments, Steinstrasse, infection associated with obstructing stones, need for alternate surgical procedure, need for repeat shockwave  lithotripsy, MI, CVA, PE and the inherent risks with anesthesia/conscious sedation.   For PCNL I described the risks including positioning injury, pneumothorax, hydrothorax, need for chest tube, inability to clear stone burden, renal laceration, arterial venous fistula or malformation, need for embolization of kidney, loss of kidney or renal function, need for repeat procedure, need for prolonged nephrostomy tube, ureteral avulsion, MI, CVA, PE and the inherent risks of general anesthesia.   - The patient would like to proceed with medical expulsive therapy. Please start flomax 0.4mg  daily and strain her urine. If she develops a fever over 101 she will require ureteral stent placement.   Johnie Nailer 10/17/2023, 8:48 AM

## 2023-10-17 NOTE — Plan of Care (Signed)
   Problem: Education: Goal: Knowledge of General Education information will improve Description Including pain rating scale, medication(s)/side effects and non-pharmacologic comfort measures Outcome: Progressing   Problem: Health Behavior/Discharge Planning: Goal: Ability to manage health-related needs will improve Outcome: Progressing

## 2023-10-17 NOTE — Plan of Care (Signed)
 ?  Problem: Clinical Measurements: ?Goal: Respiratory complications will improve ?Outcome: Progressing ?Goal: Cardiovascular complication will be avoided ?Outcome: Progressing ?  ?Problem: Safety: ?Goal: Ability to remain free from injury will improve ?Outcome: Progressing ?  ?

## 2023-10-18 DIAGNOSIS — N218 Other lower urinary tract calculus: Secondary | ICD-10-CM | POA: Diagnosis not present

## 2023-10-18 DIAGNOSIS — I10 Essential (primary) hypertension: Secondary | ICD-10-CM

## 2023-10-18 DIAGNOSIS — F0393 Unspecified dementia, unspecified severity, with mood disturbance: Secondary | ICD-10-CM

## 2023-10-18 DIAGNOSIS — E876 Hypokalemia: Secondary | ICD-10-CM | POA: Diagnosis not present

## 2023-10-18 DIAGNOSIS — E1169 Type 2 diabetes mellitus with other specified complication: Secondary | ICD-10-CM | POA: Diagnosis not present

## 2023-10-18 DIAGNOSIS — E440 Moderate protein-calorie malnutrition: Secondary | ICD-10-CM

## 2023-10-18 DIAGNOSIS — E785 Hyperlipidemia, unspecified: Secondary | ICD-10-CM

## 2023-10-18 LAB — MAGNESIUM: Magnesium: 2.1 mg/dL (ref 1.7–2.4)

## 2023-10-18 LAB — BASIC METABOLIC PANEL WITH GFR
Anion gap: 3 — ABNORMAL LOW (ref 5–15)
BUN: 12 mg/dL (ref 8–23)
CO2: 29 mmol/L (ref 22–32)
Calcium: 7.4 mg/dL — ABNORMAL LOW (ref 8.9–10.3)
Chloride: 106 mmol/L (ref 98–111)
Creatinine, Ser: 0.54 mg/dL (ref 0.44–1.00)
GFR, Estimated: >60 mL/min (ref >60)
Glucose, Bld: 194 mg/dL — ABNORMAL HIGH (ref 70–99)
Potassium: 3.2 mmol/L — ABNORMAL LOW (ref 3.5–5.1)
Sodium: 138 mmol/L (ref 135–145)

## 2023-10-18 LAB — URINE CULTURE: Culture: NO GROWTH

## 2023-10-18 MED ORDER — POTASSIUM CHLORIDE CRYS ER 20 MEQ PO TBCR
40.0000 meq | EXTENDED_RELEASE_TABLET | Freq: Once | ORAL | Status: AC
  Administered 2023-10-18: 40 meq via ORAL
  Filled 2023-10-18: qty 2

## 2023-10-18 MED ORDER — MELATONIN 3 MG PO TABS: 6.0000 mg | ORAL_TABLET | Freq: Every evening | ORAL | Status: DC | PRN

## 2023-10-18 NOTE — Plan of Care (Signed)
   Problem: Clinical Measurements: Goal: Respiratory complications will improve Outcome: Progressing Goal: Cardiovascular complication will be avoided Outcome: Progressing

## 2023-10-18 NOTE — Progress Notes (Signed)
 PROGRESS NOTE   Misty Price  ZOX:[id number] DOB: 04-20-1936 DOA: 10/16/2023 PCP: Omie Bickers, MD   Chief Complaint  Patient presents with   Weakness   Level of care: Telemetry  Brief Admission History:   88 y.o. female with medical history significant of advanced dementia, depression, T2DM and calorie protein malnutrition who presented with altered mental status.    For the last 5 days patient has been living with one of her daughters, prior to this she was under the care of other daughter. Unclear the patient's health status prior to last 5 days, but since she was moved, she was noted to have very poor oral intake. She has been very somnolent and hyporeactive, non verbal.  All the information is from her daughter at the beside, patient not able to give any history due to altered mental status and cognitive impairment.     Apparently her dementia is very severe, and she has been non ambulatory, wheelchair bound for 5 years.  She has been not taking her medications for long time. There is no evidence of pelvic pain or changes with her urination. No evidence of back pain. She has been non verbal.    Assessment and Plan:   Nephrolithiasis Right obstructive stone,  complicated with urine infection (no sepsis)  Plan to continue broad spectrum antibiotic therapy with ceftriaxone.  Follow up cultures, cell count and temperature curve Add flomax  Follow up with urology recommendations per Dr. Claretta Croft to try medical expulsive therapy  If patient spikes fever over 101 will likely need to have ureteral stent placed  Daughter unsure patient would survive surgical procedure. She was agreeable to a palliative medicine consultation.   Hypokalemia Continue repletion, recheck in AM   Follow up Mg in AM   Protein-calorie malnutrition  Consult nutrition for evaluation and supplements.   Depression due to dementia  Patient with acute metabolic encephalopathy which is resolving  Improving  with delirium precautions and family support at bedside   Positive sacrum pressure ulcer stage 3 present on admission, consult wound care.  Pressure Injury 10/17/23 Sacrum Bilateral Stage 2 -  Partial thickness loss of dermis presenting as a shallow open injury with a red, pink wound bed without slough. (Active)  10/17/23 0315  Location: Sacrum  Location Orientation: Bilateral  Staging: Stage 2 -  Partial thickness loss of dermis presenting as a shallow open injury with a red, pink wound bed without slough.  Wound Description (Comments):   Present on Admission: Yes  Waiting on air mattress to be delivered   End stages of Dementia  Multiple chronic fractures, pelvic and spine, very poor prognosis.  Consult palliative care.  Pt has very poor oral intake and not drinking much and not eating.  Assist with feeding order added  Essential hypertension Continue blood pressure monitoring Hold on antihypertensive medications due to risk of hypotension   Type 2 diabetes mellitus with hyperlipidemia  Glucose is not elevated on admission Hold on insulin  coverage due to risk of hypoglycemia.  Continue close monitoring  Continue statin therapy   DVT prophylaxis: enoxaparin Code Status: Full  Family Communication: daughter at bedside  Disposition: TBD    Consultants:  Palliative care Urology  Procedures:   Antimicrobials:  Ceftriaxone 5/23>>   Subjective: Pt is nonverbal today  Objective: Vitals:   10/17/23 1245 10/17/23 2037 10/18/23 0452 10/18/23 1517  BP: (!) 106/43 117/85 (!) 150/69 116/76  Pulse: 99 96 96 96  Resp:  13 15 16   Temp:  98 F (36.7 C) (!) 97.4 F (36.3 C) 97.6 F (36.4 C) 98 F (36.7 C)  TempSrc: Oral Oral Oral Oral  SpO2: 91% 90% 93% 91%  Weight:      Height:        Intake/Output Summary (Last 24 hours) at 10/18/2023 1659 Last data filed at 10/18/2023 1519 Gross per 24 hour  Intake 1765.71 ml  Output 500 ml  Net 1265.71 ml   Filed Weights   10/16/23  1817  Weight: 68 kg   Examination:  General exam: Appears calm and comfortable  Respiratory system: Clear to auscultation. Respiratory effort normal. Cardiovascular system: normal S1 & S2 heard. No JVD, murmurs, rubs, gallops or clicks. No pedal edema. Gastrointestinal system: Abdomen is nondistended, soft and nontender. No organomegaly or masses felt. Normal bowel sounds heard. Central nervous system: somnolent but arousable. No focal neurological deficits. Extremities: Symmetric 5 x 5 power. Skin: sacral pressure wounds Psychiatry: UTD.  Pressure Injury 10/17/23 Sacrum Bilateral Stage 2 -  Partial thickness loss of dermis presenting as a shallow open injury with a red, pink wound bed without slough. (Active)  10/17/23 0315  Location: Sacrum  Location Orientation: Bilateral  Staging: Stage 2 -  Partial thickness loss of dermis presenting as a shallow open injury with a red, pink wound bed without slough.  Wound Description (Comments):   Present on Admission: Yes   Data Reviewed: I have personally reviewed following labs and imaging studies  CBC: Recent Labs  Lab 10/16/23 1918 10/17/23 0421  WBC 12.3* 12.0*  NEUTROABS 11.0*  --   HGB 9.8* 9.6*  HCT 31.8* 30.4*  MCV 103.2* 104.1*  PLT 203 215    Basic Metabolic Panel: Recent Labs  Lab 10/16/23 1918 10/17/23 0041 10/17/23 0421 10/18/23 0401  NA 138  --  139 138  K 2.3* 3.0* 2.9* 3.2*  CL 95*  --  101 106  CO2 29  --  28 29  GLUCOSE 251*  --  193* 194*  BUN 17  --  17 12  CREATININE 1.11*  --  0.83 0.54  CALCIUM  7.6*  --  7.3* 7.4*  MG  --   --  1.8 2.1    CBG: No results for input(s): "GLUCAP" in the last 168 hours.  Recent Results (from the past 240 hours)  Urine Culture     Status: None   Collection Time: 10/17/23 11:02 AM   Specimen: Urine, Catheterized  Result Value Ref Range Status   Specimen Description   Final    URINE, CATHETERIZED Performed at [facility], [address], [place], [place]  [address]    Special Requests   Final    NONE Performed at [facility], [address], [place], [place] [address]    Culture   Final    NO GROWTH Performed at [facility], [address], [place], [place] [address]    Report Status 10/18/2023 FINAL  Final     Radiology Studies: CT CHEST ABDOMEN PELVIS W CONTRAST Result Date: 10/16/2023 CLINICAL DATA:  Sepsis EXAM: CT CHEST, ABDOMEN, AND PELVIS WITH CONTRAST TECHNIQUE: Multidetector CT imaging of the chest, abdomen and pelvis was performed following the standard protocol during bolus administration of intravenous contrast. RADIATION DOSE REDUCTION: This exam was performed according to the departmental dose-optimization program which includes automated exposure control, adjustment of the mA and/or kV according to patient size and/or use of iterative reconstruction technique. CONTRAST:  100mL OMNIPAQUE IOHEXOL 300 MG/ML  SOLN COMPARISON:  None Available. FINDINGS: CT CHEST FINDINGS Cardiovascular: No significant vascular findings. Normal heart size. No pericardial effusion. There are atherosclerotic calcifications of the aorta and coronary arteries. Mediastinum/Nodes: No enlarged mediastinal, hilar, or axillary lymph nodes. Thyroid  gland, trachea, and esophagus demonstrate no significant findings. Lungs/Pleura: There are trace bilateral pleural effusions. There is mild atelectasis in the bilateral lower lobes and lingula. The lungs are otherwise clear. There secretions in the left mainstem bronchus. Musculoskeletal: There is mild compression deformity of T3 which is age indeterminate, but favored as chronic. CT ABDOMEN PELVIS FINDINGS Hepatobiliary: No focal liver abnormality is seen. Status post cholecystectomy. No biliary dilatation. Pancreas: Unremarkable. No pancreatic ductal dilatation or surrounding inflammatory changes. Spleen: Normal in size without focal abnormality. Adrenals/Urinary Tract: There is a 4 mm calculus at the right  ureterovesicular junction. There is mild to moderate right-sided hydroureteronephrosis. There is a 2 mm calculus in the left kidney. Otherwise, the left kidney is within normal limits. Adrenal glands and bladder are within normal limits. Stomach/Bowel: There is circumferential rectal wall thickening with presacral edema. There is no bowel obstruction, pneumatosis or free air. The appendix is within normal limits. There is questionable gastric antral wall thickening in there is wall thickening of the proximal duodenum. There is mild surrounding inflammatory stranding. The stomach is otherwise within normal limits. Vascular/Lymphatic: Aortic atherosclerosis. No enlarged abdominal or pelvic lymph nodes. Reproductive: Uterus and bilateral adnexa are unremarkable. Other: There is no ascites. There is a small fat containing umbilical hernia. There are sutures along the anterior abdominal wall. Musculoskeletal: There are left superior and inferior pubic rami fractures which are likely subacute. Left-sided hip screw and intramedullary nail are present. There also bilateral sacral ala fractures which appear subacute or chronic. The bones are diffusely osteopenic. There is also fracture of the inferior endplate of S1 favored as subacute or chronic. No soft tissue fluid collection or gas. IMPRESSION: 1. 4 mm calculus at the right ureterovesicular junction with mild to moderate right-sided hydroureteronephrosis. 2. Nonobstructing left renal calculus. 3. Trace bilateral pleural effusions with mild bibasilar atelectasis. 4. Secretions in the left mainstem bronchus. 5. Circumferential rectal wall thickening with presacral edema compatible with proctitis. 6. Wall thickening of the proximal duodenum and gastric antrum with surrounding inflammatory stranding worrisome for duodenitis and gastritis. 7. Subacute or chronic fractures of the left superior and inferior pubic rami, bilateral sacral ala, and inferior endplate of S1. 8. Mild  compression deformity of T3 is age indeterminate, but favored as chronic. 9. Aortic atherosclerosis. Aortic Atherosclerosis (ICD10-I70.0). Electronically Signed   By: Tyron Gallon M.D.   On: 10/16/2023 21:09    Scheduled Meds:  atorvastatin   40 mg Oral Daily   enoxaparin (LOVENOX) injection  40 mg Subcutaneous Q24H   leptospermum manuka honey  1 Application Topical Daily   liver oil-zinc oxide   Topical BID   pantoprazole (PROTONIX) IV  40 mg Intravenous Q24H   pneumococcal 20-valent conjugate vaccine  0.5 mL Intramuscular Tomorrow-1000   tamsulosin  0.4 mg Oral QPC breakfast   Continuous Infusions:  cefTRIAXone (ROCEPHIN)  IV Stopped (10/17/23 2320)     LOS: 1 day   Time spent: 59 mins  Eileene Kisling Lincoln Renshaw, MD How to contact the [facility] Attending or Consulting provider 7A - 7P or covering provider during after hours 7P -7A, for this patient?  Check the care team in [facility] and look for a) attending/consulting [facility] provider listed and b) the [facility] team listed Log into [email or web address] to find provider on call.  Locate  the [facility] provider you are looking for under [facility] and page to a number that you can be directly reached. If you still have difficulty reaching the provider, please page the [facility] (Director on Call) for the Hospitalists listed on amion for assistance.  10/18/2023, 4:59 PM

## 2023-10-18 NOTE — Plan of Care (Signed)

## 2023-10-18 NOTE — Progress Notes (Signed)
 At 2300, RN observed patient's left hand and wrist were swollen around the IV site where D5NS continuous fluids were infusing. RN stopped fluids, removed the IV, placed gauze dressing, and elevated the patient's left arm on a pillow. The patient has a ring on the middle finger that was moveable, but not able to lift off the swollen finger. Charge nurse and oncoming shift nurse both observed patient's hand.

## 2023-10-19 DIAGNOSIS — I10 Essential (primary) hypertension: Secondary | ICD-10-CM | POA: Diagnosis not present

## 2023-10-19 DIAGNOSIS — E1169 Type 2 diabetes mellitus with other specified complication: Secondary | ICD-10-CM | POA: Diagnosis not present

## 2023-10-19 DIAGNOSIS — N218 Other lower urinary tract calculus: Secondary | ICD-10-CM | POA: Diagnosis not present

## 2023-10-19 DIAGNOSIS — E876 Hypokalemia: Secondary | ICD-10-CM | POA: Diagnosis not present

## 2023-10-19 LAB — CBC WITH DIFFERENTIAL/PLATELET
Abs Immature Granulocytes: 0.04 10*3/uL (ref 0.00–0.07)
Basophils Absolute: 0 10*3/uL (ref 0.0–0.1)
Basophils Relative: 0 %
Eosinophils Absolute: 0.1 10*3/uL (ref 0.0–0.5)
Eosinophils Relative: 2 %
HCT: 29.3 % — ABNORMAL LOW (ref 36.0–46.0)
Hemoglobin: 9.2 g/dL — ABNORMAL LOW (ref 12.0–15.0)
Immature Granulocytes: 1 %
Lymphocytes Relative: 23 %
Lymphs Abs: 1.5 10*3/uL (ref 0.7–4.0)
MCH: 32.5 pg (ref 26.0–34.0)
MCHC: 31.4 g/dL (ref 30.0–36.0)
MCV: 103.5 fL — ABNORMAL HIGH (ref 80.0–100.0)
Monocytes Absolute: 0.6 10*3/uL (ref 0.1–1.0)
Monocytes Relative: 8 %
Neutro Abs: 4.3 10*3/uL (ref 1.7–7.7)
Neutrophils Relative %: 66 %
Platelets: 210 10*3/uL (ref 150–400)
RBC: 2.83 MIL/uL — ABNORMAL LOW (ref 3.87–5.11)
RDW: 14.9 % (ref 11.5–15.5)
WBC: 6.5 10*3/uL (ref 4.0–10.5)
nRBC: 0 % (ref 0.0–0.2)

## 2023-10-19 LAB — BASIC METABOLIC PANEL WITH GFR
Anion gap: 2 — ABNORMAL LOW (ref 5–15)
BUN: 8 mg/dL (ref 8–23)
CO2: 29 mmol/L (ref 22–32)
Calcium: 7.5 mg/dL — ABNORMAL LOW (ref 8.9–10.3)
Chloride: 106 mmol/L (ref 98–111)
Creatinine, Ser: 0.5 mg/dL (ref 0.44–1.00)
GFR, Estimated: >60 mL/min (ref >60)
Glucose, Bld: 166 mg/dL — ABNORMAL HIGH (ref 70–99)
Potassium: 3.6 mmol/L (ref 3.5–5.1)
Sodium: 137 mmol/L (ref 135–145)

## 2023-10-19 NOTE — Plan of Care (Signed)

## 2023-10-19 NOTE — Progress Notes (Signed)
 PROGRESS NOTE   Misty Price  MVH:[id number] DOB: 21-Feb-1936 DOA: 10/16/2023 PCP: Omie Bickers, MD   Chief Complaint  Patient presents with   Weakness   Level of care: Med-Surg  Brief Admission History:   88 y.o. female with medical history significant of advanced dementia, depression, T2DM and calorie protein malnutrition who presented with altered mental status.    For the last 5 days patient has been living with one of her daughters, prior to this she was under the care of other daughter. Unclear the patient's health status prior to last 5 days, but since she was moved, she was noted to have very poor oral intake. She has been very somnolent and hyporeactive, non verbal.  All the information is from her daughter at the beside, patient not able to give any history due to altered mental status and cognitive impairment.     Apparently her dementia is very severe, and she has been non ambulatory, wheelchair bound for 5 years.  She has been not taking her medications for long time. There is no evidence of pelvic pain or changes with her urination. No evidence of back pain. She has been non verbal.    Assessment and Plan:   Nephrolithiasis Right obstructive stone,  complicated with urine infection (no sepsis)  Plan to continue broad spectrum antibiotic therapy with ceftriaxone.  Follow up cultures, cell count and temperature curve Added flomax  Follow up with urology recommendations per Dr. Claretta Croft to try medical expulsive therapy  If patient spikes fever over 101 will likely need to have ureteral stent placed  Daughter unsure patient would survive surgical procedure. She was agreeable to a palliative medicine consultation for in depth goals of care discussion.   Hypokalemia - repleted  Continue repletion, recheck in AM   Mg is repleted at 2.1  Protein-calorie malnutrition  Consult nutrition for evaluation and supplements.   Depression due to dementia  Patient with acute  metabolic encephalopathy which is resolving  Improving with delirium precautions and family support at bedside   Positive sacrum pressure ulcer stage 3 present on admission, consult wound care.  Pressure Injury 10/17/23 Sacrum Bilateral Stage 2 -  Partial thickness loss of dermis presenting as a shallow open injury with a red, pink wound bed without slough. (Active)  10/17/23 0315  Location: Sacrum  Location Orientation: Bilateral  Staging: Stage 2 -  Partial thickness loss of dermis presenting as a shallow open injury with a red, pink wound bed without slough.  Wound Description (Comments):   Present on Admission: Yes  Waiting on air mattress to be delivered   End stages of Dementia  Multiple chronic fractures, pelvic and spine, very poor prognosis.  Consult palliative care.  Pt has very poor oral intake and not drinking much and not eating.  Assist with feeding order added  Essential hypertension Continue blood pressure monitoring Hold on antihypertensive medications due to risk of hypotension   Type 2 diabetes mellitus with hyperlipidemia  Glucose is not elevated on admission Hold on insulin  coverage due to risk of hypoglycemia.  Continue close monitoring  Continue statin therapy   DVT prophylaxis: enoxaparin Code Status: Full  Family Communication: daughter at bedside  Disposition: TBD    Consultants:  Palliative care Urology  Procedures:   Antimicrobials:  Ceftriaxone 5/23>>   Subjective: Pt verbal but very confused with advanced dementia   Objective: Vitals:   10/18/23 1517 10/18/23 2050 10/19/23 0318 10/19/23 1344  BP: 116/76 (!) 141/69 (!) 163/80 108/62  Pulse: 96 (!) 106 (!) 105 100  Resp: 16 18 19 20   Temp: 98 F (36.7 C) 98.2 F (36.8 C) 98.1 F (36.7 C) 97.8 F (36.6 C)  TempSrc: Oral Oral Oral Oral  SpO2: 91% 98% 93% 96%  Weight:      Height:        Intake/Output Summary (Last 24 hours) at 10/19/2023 1608 Last data filed at 10/19/2023  1400 Gross per 24 hour  Intake 290 ml  Output 1000 ml  Net -710 ml   Filed Weights   10/16/23 1817  Weight: 68 kg   Examination:  General exam: Appears calm and comfortable  Respiratory system: Clear to auscultation. Respiratory effort normal. Cardiovascular system: normal S1 & S2 heard. No JVD, murmurs, rubs, gallops or clicks. No pedal edema. Gastrointestinal system: Abdomen is nondistended, soft and nontender. No organomegaly or masses felt. Normal bowel sounds heard. Central nervous system: somnolent but arousable. No focal neurological deficits. Extremities: Symmetric 5 x 5 power. Skin: sacral pressure wounds  Psychiatry: UTD.  Pressure Injury 10/17/23 Sacrum Bilateral Stage 2 -  Partial thickness loss of dermis presenting as a shallow open injury with a red, pink wound bed without slough. (Active)  10/17/23 0315  Location: Sacrum  Location Orientation: Bilateral  Staging: Stage 2 -  Partial thickness loss of dermis presenting as a shallow open injury with a red, pink wound bed without slough.  Wound Description (Comments):   Present on Admission: Yes   Data Reviewed: I have personally reviewed following labs and imaging studies  CBC: Recent Labs  Lab 10/16/23 1918 10/17/23 0421 10/19/23 0210  WBC 12.3* 12.0* 6.5  NEUTROABS 11.0*  --  4.3  HGB 9.8* 9.6* 9.2*  HCT 31.8* 30.4* 29.3*  MCV 103.2* 104.1* 103.5*  PLT 203 215 210    Basic Metabolic Panel: Recent Labs  Lab 10/16/23 1918 10/17/23 0041 10/17/23 0421 10/18/23 0401 10/19/23 0210  NA 138  --  139 138 137  K 2.3* 3.0* 2.9* 3.2* 3.6  CL 95*  --  101 106 106  CO2 29  --  28 29 29   GLUCOSE 251*  --  193* 194* 166*  BUN 17  --  17 12 8   CREATININE 1.11*  --  0.83 0.54 0.50  CALCIUM  7.6*  --  7.3* 7.4* 7.5*  MG  --   --  1.8 2.1  --     CBG: No results for input(s): "GLUCAP" in the last 168 hours.  Recent Results (from the past 240 hours)  Urine Culture     Status: None   Collection Time:  10/17/23 11:02 AM   Specimen: Urine, Catheterized  Result Value Ref Range Status   Specimen Description   Final    URINE, CATHETERIZED Performed at [facility], [address] [place], [place] [address]    Special Requests   Final    NONE Performed at [facility], [address], [place], [place] [address]    Culture   Final    NO GROWTH Performed at [facility], [address], [place], [place] [address]    Report Status 10/18/2023 FINAL  Final    Radiology Studies: No results found.  Scheduled Meds:  atorvastatin   40 mg Oral Daily   enoxaparin (LOVENOX) injection  40 mg Subcutaneous Q24H   leptospermum manuka honey  1 Application Topical Daily   liver oil-zinc oxide   Topical BID   pantoprazole (PROTONIX) IV  40 mg Intravenous Q24H  pneumococcal 20-valent conjugate vaccine  0.5 mL Intramuscular Tomorrow-1000   tamsulosin  0.4 mg Oral QPC breakfast   Continuous Infusions:  cefTRIAXone (ROCEPHIN)  IV Stopped (10/19/23 0905)     LOS: 2 days   Time spent: 55 mins  Frimet Durfee Lincoln Renshaw, MD How to contact the [facility] Attending or Consulting provider 7A - 7P or covering provider during after hours 7P -7A, for this patient?  Check the care team in [facility] and look for a) attending/consulting TRH provider listed and b) the TRH team listed Log into [email or web address] to find provider on call.  Locate the TRH provider you are looking for under [facility] and page to a number that you can be directly reached. If you still have difficulty reaching the provider, please page the [facility] (Director on Call) for the Hospitalists listed on amion for assistance.  10/19/2023, 4:08 PM

## 2023-10-19 NOTE — Plan of Care (Signed)
  Problem: Acute Rehab PT Goals(only PT should resolve) Goal: Pt Will Go Supine/Side To Sit Outcome: Progressing Flowsheets (Taken 10/19/2023 1328) Pt will go Supine/Side to Sit: with minimal assist Goal: Patient Will Perform Sitting Balance Outcome: Progressing Flowsheets (Taken 10/19/2023 1328) Patient will perform sitting balance:  with modified independence  with supervision Goal: Patient Will Transfer Sit To/From Stand Outcome: Progressing Flowsheets (Taken 10/19/2023 1328) Patient will transfer sit to/from stand:  with minimal assist  with moderate assist Goal: Pt Will Transfer Bed To Chair/Chair To Bed Outcome: Progressing Flowsheets (Taken 10/19/2023 1328) Pt will Transfer Bed to Chair/Chair to Bed:  with min assist  with mod assist   1:28 PM, 10/19/23 Walton Guppy, MPT Physical Therapist with [facility] [phone] office [phone] mobile phone

## 2023-10-19 NOTE — TOC CM/SW Note (Signed)
 CM met with patient's daughter at the bedside. Misty Price [phone].  Patient's  daughter requested patient be dischargw with HH PT. Vickie requested to speak with [facility] regarding palliative  care at  home.

## 2023-10-19 NOTE — Evaluation (Signed)
 Physical Therapy Evaluation Patient Details Name: Martita Brumm MRN: [id number] DOB: [birth year] Today's Date: 10/19/2023  History of Present Illness  Mathew Storck is a 88 y.o. female with medical history significant of advanced dementia, depression, T2DM and calorie protein malnutrition who presented with altered mental status.      For the last 5 days patient has been living with one of her daughters, prior to this she was under the care of other daughter. Unclear the patient's health status prior to last 5 days, but since she was moved, she was noted to have very poor oral intake. She has been very somnolent and hyporeactive, non verbal.   All the information is from her daughter at the beside, patient not able to give any history due to altered mental status and cognitive impairment.       Apparently her dementia is very severe, and she has been non ambulatory, wheelchair bound for 5 years.   She has been not taking her medications for long time. There is no evidence of pelvic pain or changes with her urination. No evidence of back pain. She has been non verbal.   Clinical Impression  Patient demonstrates slow labored movement for sitting up at bedside, once seated, very apprehensive to transfer to chair, required repeated attempts before transferring to chair with knees blocked stand pivot and tolerated staying up in chair with family members present after therapy. Patient will benefit from continued skilled physical therapy in hospital and recommended venue below to increase strength, balance, endurance for safe ADLs and gait.          If plan is discharge home, recommend the following: A lot of help with bathing/dressing/bathroom;A lot of help with walking and/or transfers;Help with stairs or ramp for entrance;Assistance with cooking/housework   Can travel by private vehicle        Equipment Recommendations None recommended by PT  Recommendations for Other Services        Functional Status Assessment Patient has had a recent decline in their functional status and/or demonstrates limited ability to make significant improvements in function in a reasonable and predictable amount of time     Precautions / Restrictions Precautions Precautions: Fall Restrictions Weight Bearing Restrictions Per Provider Order: No      Mobility  Bed Mobility Overal bed mobility: Needs Assistance Bed Mobility: Supine to Sit     Supine to sit: Mod assist, Max assist     General bed mobility comments: increased time, labored movement    Transfers Overall transfer level: Needs assistance Equipment used: 1 person hand held assist Transfers: Bed to chair/wheelchair/BSC, Sit to/from Stand   Stand pivot transfers: Max assist         General transfer comment: poor tolerance for standing due to pain in feet and apprehension    Ambulation/Gait                  Stairs            Wheelchair Mobility     Tilt Bed    Modified Rankin (Stroke Patients Only)       Balance Overall balance assessment: Needs assistance Sitting-balance support: Feet supported, No upper extremity supported Sitting balance-Leahy Scale: Fair Sitting balance - Comments: seated at EOB   Standing balance support: During functional activity, No upper extremity supported Standing balance-Leahy Scale: Poor Standing balance comment: hand held assist  Pertinent Vitals/Pain Pain Assessment Pain Assessment: Faces Faces Pain Scale: Hurts little more Pain Location: with presure to feet Pain Descriptors / Indicators: Sore, Grimacing, Discomfort, Guarding Pain Intervention(s): Limited activity within patient's tolerance, Monitored during session, Repositioned    Home Living Family/patient expects to be discharged to:: Private residence Living Arrangements: Children Available Help at Discharge: Family;Available 24 hours/day Type of Home:  House Home Access: Stairs to enter Entrance Stairs-Rails: Right;Left;Can reach both Entrance Stairs-Number of Steps: 2   Home Layout: One level Home Equipment: Cane - single Librarian, academic (2 wheels)      Prior Function Prior Level of Function : Needs assist       Physical Assist : Mobility (physical) Mobility (physical): Bed mobility;Transfers   Mobility Comments: Assisted for transfers, wheelchair bound, non-ambulatory ADLs Comments: Assisted by family     Extremity/Trunk Assessment   Upper Extremity Assessment Upper Extremity Assessment: Defer to OT evaluation    Lower Extremity Assessment Lower Extremity Assessment: Generalized weakness    Cervical / Trunk Assessment Cervical / Trunk Assessment: Kyphotic  Communication   Communication Factors Affecting Communication: Hearing impaired    Cognition Arousal: Alert Behavior During Therapy: Anxious   PT - Cognitive impairments: History of cognitive impairments                         Following commands: Impaired Following commands impaired: Follows one step commands with increased time     Cueing Cueing Techniques: Verbal cues, Tactile cues     General Comments      Exercises     Assessment/Plan    PT Assessment Patient needs continued PT services  PT Problem List Decreased strength;Decreased activity tolerance;Decreased balance;Decreased mobility       PT Treatment Interventions DME instruction;Functional mobility training;Therapeutic activities;Therapeutic exercise;Balance training;Patient/family education    PT Goals (Current goals can be found in the Care Plan section)  Acute Rehab PT Goals Patient Stated Goal: return home PT Goal Formulation: With patient/family Time For Goal Achievement: 10/24/23 Potential to Achieve Goals: Good    Frequency Min 3X/week     Co-evaluation               AM-PAC PT "6 Clicks" Mobility  Outcome Measure Help needed turning from your  back to your side while in a flat bed without using bedrails?: A Lot Help needed moving from lying on your back to sitting on the side of a flat bed without using bedrails?: A Lot Help needed moving to and from a bed to a chair (including a wheelchair)?: A Lot Help needed standing up from a chair using your arms (e.g., wheelchair or bedside chair)?: A Lot Help needed to walk in hospital room?: Total Help needed climbing 3-5 steps with a railing? : Total 6 Click Score: 10    End of Session   Activity Tolerance: Patient tolerated treatment well;Patient limited by fatigue;Patient limited by pain Patient left: in chair;with call bell/phone within reach;with chair alarm set;with family/visitor present Nurse Communication: Mobility status PT Visit Diagnosis: Unsteadiness on feet (R26.81);Other abnormalities of gait and mobility (R26.89);Muscle weakness (generalized) (M62.81)    Time: 1914-7829 PT Time Calculation (min) (ACUTE ONLY): 21 min   Charges:   PT Evaluation $PT Eval Moderate Complexity: 1 Mod PT Treatments $Therapeutic Activity: 8-22 mins PT General Charges $$ ACUTE PT VISIT: 1 Visit         1:27 PM, 10/19/23 Walton Guppy, MPT Physical Therapist with [facility] [phone]  office [phone] mobile phone

## 2023-10-19 NOTE — Plan of Care (Signed)
   Problem: Elimination: Goal: Will not experience complications related to bowel motility Outcome: Progressing   Problem: Safety: Goal: Ability to remain free from injury will improve Outcome: Progressing   Problem: Skin Integrity: Goal: Risk for impaired skin integrity will decrease Outcome: Progressing

## 2023-10-20 DIAGNOSIS — N218 Other lower urinary tract calculus: Secondary | ICD-10-CM | POA: Diagnosis not present

## 2023-10-20 DIAGNOSIS — E1169 Type 2 diabetes mellitus with other specified complication: Secondary | ICD-10-CM | POA: Diagnosis not present

## 2023-10-20 DIAGNOSIS — I10 Essential (primary) hypertension: Secondary | ICD-10-CM | POA: Diagnosis not present

## 2023-10-20 DIAGNOSIS — E876 Hypokalemia: Secondary | ICD-10-CM | POA: Diagnosis not present

## 2023-10-20 MED ORDER — POTASSIUM CHLORIDE CRYS ER 20 MEQ PO TBCR
40.0000 meq | EXTENDED_RELEASE_TABLET | Freq: Once | ORAL | Status: AC
  Administered 2023-10-20: 40 meq via ORAL
  Filled 2023-10-20: qty 4

## 2023-10-20 MED ORDER — LISINOPRIL 10 MG PO TABS
10.0000 mg | ORAL_TABLET | Freq: Every day | ORAL | Status: DC
  Administered 2023-10-20 – 2023-10-21 (×2): 10 mg via ORAL
  Filled 2023-10-20 (×2): qty 1

## 2023-10-20 MED ORDER — ATENOLOL 25 MG PO TABS
12.5000 mg | ORAL_TABLET | Freq: Every day | ORAL | Status: DC
  Administered 2023-10-20 – 2023-10-21 (×2): 12.5 mg via ORAL
  Filled 2023-10-20 (×2): qty 1

## 2023-10-20 NOTE — Plan of Care (Signed)
 ?  Problem: Clinical Measurements: ?Goal: Ability to maintain clinical measurements within normal limits will improve ?Outcome: Progressing ?Goal: Will remain free from infection ?Outcome: Progressing ?Goal: Diagnostic test results will improve ?Outcome: Progressing ?  ?

## 2023-10-20 NOTE — Progress Notes (Signed)
 Patient requires frequent re-positioning of the body in ways that cannot be achieved with  an ordinary bed or wedge pillow, to eliminate pain, reduce pressure, and the head of the  bed to be elevated more than 30 degrees most of the time due to pressure injury to sacrum and end stages of dementia. Family able to use controls to move bed when needed.

## 2023-10-20 NOTE — Progress Notes (Signed)
 PROGRESS NOTE   Misty Price  NFA:[id number] DOB: 05/08/1936 DOA: 10/16/2023 PCP: Omie Bickers, MD   Chief Complaint  Patient presents with   Weakness   Level of care: Med-Surg  Brief Admission History:   88 y.o. female with medical history significant of advanced dementia, depression, T2DM and calorie protein malnutrition who presented with altered mental status.    For the last 5 days patient has been living with one of her daughters, prior to this she was under the care of other daughter. Unclear the patient's health status prior to last 5 days, but since she was moved, she was noted to have very poor oral intake. She has been very somnolent and hyporeactive, non verbal.  All the information is from her daughter at the beside, patient not able to give any history due to altered mental status and cognitive impairment.     Apparently her dementia is very severe, and she has been non ambulatory, wheelchair bound for 5 years.  She has been not taking her medications for long time. There is no evidence of pelvic pain or changes with her urination. No evidence of back pain. She has been non verbal.    Assessment and Plan:  Nephrolithiasis Right obstructive stone,  complicated with urine infection (no sepsis)  Plan to continue broad spectrum antibiotic therapy with ceftriaxone.  Follow up cultures, cell count and temperature curve Added flomax  Follow up with urology recommendations per Dr. Claretta Croft to try medical expulsive therapy  If patient spikes fever over 101 will likely need to have ureteral stent placed  Daughter unsure patient would survive surgical procedure. She was agreeable to a palliative medicine consultation for in depth goals of care discussion.  Fortunately no fevers and seems to be tolerating tamsulosin daily Not sure if kidney stone was passed or not  Continue current treatments.  Daughter would like to take home tomorrow when hospital bed delivered  Hypokalemia -  repleted  Continue repletion, recheck in AM   Mg is repleted at 2.1  Protein-calorie malnutrition  Consult nutrition for evaluation and supplements.   Depression due to dementia  Patient with acute metabolic encephalopathy which is resolving  Improving with delirium precautions and family support at bedside   Positive sacrum pressure ulcer stage 3 present on admission, consult wound care.  Pressure Injury 10/17/23 Sacrum Bilateral Stage 2 -  Partial thickness loss of dermis presenting as a shallow open injury with a red, pink wound bed without slough. (Active)  10/17/23 0315  Location: Sacrum  Location Orientation: Bilateral  Staging: Stage 2 -  Partial thickness loss of dermis presenting as a shallow open injury with a red, pink wound bed without slough.  Wound Description (Comments):   Present on Admission: Yes  Waiting on air mattress to be delivered   End stages of Dementia  Multiple chronic fractures, pelvic and spine, very poor prognosis.  Consult palliative care.  Pt has very poor oral intake and not drinking much and not eating.  Assist with feeding order added Daughter leaning towards not moving forward with any surgical procedures   Essential hypertension Continue blood pressure monitoring Pt had stopped taking most of her home meds in last 30 days Resume lisinopril  10 mg, atenolol 12.5 mg daily for elevated readings   Type 2 diabetes mellitus with hyperlipidemia  Glucose is not elevated on admission Hold on insulin  coverage due to risk of hypoglycemia.  Continue close monitoring  Continue statin therapy   DVT prophylaxis: enoxaparin Code  Status: Full  Family Communication: daughter at bedside updated 5/26 Disposition: TBD    Consultants:  Palliative care Urology  Procedures:   Antimicrobials:  Ceftriaxone 5/23>> 5/26  Subjective: Pt has advanced dementia, not confirmed if she passed renal stone or not.     Objective: Vitals:   10/19/23 0318 10/19/23  1344 10/19/23 2014 10/20/23 0432  BP: (!) 163/80 108/62 (!) 165/91 (!) 141/71  Pulse: (!) 105 100  (!) 101  Resp: 19 20 18 18   Temp: 98.1 F (36.7 C) 97.8 F (36.6 C) 98.4 F (36.9 C) 98.2 F (36.8 C)  TempSrc: Oral Oral Oral Oral  SpO2: 93% 96% 92% 93%  Weight:      Height:        Intake/Output Summary (Last 24 hours) at 10/20/2023 1203 Last data filed at 10/20/2023 0703 Gross per 24 hour  Intake 200 ml  Output 400 ml  Net -200 ml   Filed Weights   10/16/23 1817  Weight: 68 kg   Examination:  General exam: Appears calm and comfortable  Respiratory system: Clear to auscultation. Respiratory effort normal. Cardiovascular system: normal S1 & S2 heard. No JVD, murmurs, rubs, gallops or clicks. No pedal edema. Gastrointestinal system: Abdomen is nondistended, soft and nontender. No organomegaly or masses felt. Normal bowel sounds heard. Central nervous system: somnolent but arousable. No focal neurological deficits. Extremities: Symmetric 5 x 5 power. Skin: sacral pressure wounds  Psychiatry: UTD.  Pressure Injury 10/17/23 Sacrum Bilateral Stage 2 -  Partial thickness loss of dermis presenting as a shallow open injury with a red, pink wound bed without slough. (Active)  10/17/23 0315  Location: Sacrum  Location Orientation: Bilateral  Staging: Stage 2 -  Partial thickness loss of dermis presenting as a shallow open injury with a red, pink wound bed without slough.  Wound Description (Comments):   Present on Admission: Yes   Data Reviewed: I have personally reviewed following labs and imaging studies  CBC: Recent Labs  Lab 10/16/23 1918 10/17/23 0421 10/19/23 0210  WBC 12.3* 12.0* 6.5  NEUTROABS 11.0*  --  4.3  HGB 9.8* 9.6* 9.2*  HCT 31.8* 30.4* 29.3*  MCV 103.2* 104.1* 103.5*  PLT 203 215 210    Basic Metabolic Panel: Recent Labs  Lab 10/16/23 1918 10/17/23 0041 10/17/23 0421 10/18/23 0401 10/19/23 0210  NA 138  --  139 138 137  K 2.3* 3.0* 2.9* 3.2*  3.6  CL 95*  --  101 106 106  CO2 29  --  28 29 29   GLUCOSE 251*  --  193* 194* 166*  BUN 17  --  17 12 8   CREATININE 1.11*  --  0.83 0.54 0.50  CALCIUM  7.6*  --  7.3* 7.4* 7.5*  MG  --   --  1.8 2.1  --     CBG: No results for input(s): "GLUCAP" in the last 168 hours.  Recent Results (from the past 240 hours)  Urine Culture     Status: None   Collection Time: 10/17/23 11:02 AM   Specimen: Urine, Catheterized  Result Value Ref Range Status   Specimen Description   Final    URINE, CATHETERIZED Performed at [facility], [address] [place], [place] [address]    Special Requests   Final    NONE Performed at [facility], [address] [place], [place] [address]    Culture   Final    NO GROWTH Performed at [facility], [address]  [place], [place] [address]    Report Status 10/18/2023 FINAL  Final    Radiology Studies: No results found.  Scheduled Meds:  atorvastatin   40 mg Oral Daily   enoxaparin (LOVENOX) injection  40 mg Subcutaneous Q24H   leptospermum manuka honey  1 Application Topical Daily   liver oil-zinc oxide   Topical BID   pantoprazole (PROTONIX) IV  40 mg Intravenous Q24H   pneumococcal 20-valent conjugate vaccine  0.5 mL Intramuscular Tomorrow-1000   potassium chloride   40 mEq Oral Once   tamsulosin  0.4 mg Oral QPC breakfast   Continuous Infusions:  cefTRIAXone (ROCEPHIN)  IV Stopped (10/19/23 2146)     LOS: 3 days   Time spent: 55 mins  Zaley Talley Lincoln Renshaw, MD How to contact the [facility] Attending or Consulting provider 7A - 7P or covering provider during after hours 7P -7A, for this patient?  Check the care team in [facility] and look for a) attending/consulting TRH provider listed and b) the TRH team listed Log into [email or web address] to find provider on call.  Locate the TRH provider you are looking for under [facility] and page to a number that you can be directly reached. If you still have difficulty reaching the provider, please page  the [facility] (Director on Call) for the Hospitalists listed on amion for assistance.  10/20/2023, 12:03 PM

## 2023-10-20 NOTE — TOC Initial Note (Signed)
 Transition of Care [facility]) - Initial/Assessment Note    Patient Details  Name: Misty Price MRN: [id number] Date of Birth: 1935/06/09  Transition of Care [facility] CM/SW Contact:    Grandville Lax, LCSWA Phone Number: 10/20/2023, 10:00 AM  Clinical Narrative:                 CSW notes hospital air mattress overlay has been ordered for home. CSW spoke with pts daughter who states that they do not have a hospital bed at home but are agreeable to one being ordered. CSW requested that MD place order for home hospital bed. Once orders placed CSW to refer to [employer]. Pts daughter states they would like HH PT arranged and agreeable to [facility] [place] [facility], referral to be made. Also, requesting palliative referral to [facility]. TOC to follow.   Expected Discharge Plan: Home w Home Health Services Barriers to Discharge: Continued Medical Work up   Patient Goals and CMS Choice Patient states their goals for this hospitalization and ongoing recovery are:: return home CMS Medicare.gov Compare Post Acute Care list provided to:: Patient Represenative (must comment) Choice offered to / list presented to : Adult Children      Expected Discharge Plan and Services In-house Referral: Clinical Social Work Discharge Planning Services: CM Consult Post Acute Care Choice: Home Health, Durable Medical Equipment Living arrangements for the past 2 months: Single Family Home                                      Prior Living Arrangements/Services Living arrangements for the past 2 months: Single Family Home Lives with:: Adult Children Patient language and need for interpreter reviewed:: Yes Do you feel safe going back to the place where you live?: Yes      Need for Family Participation in Patient Care: Yes (Comment) Care giver support system in place?: Yes (comment)   Criminal Activity/Legal Involvement Pertinent to Current Situation/Hospitalization: No - Comment as needed  Activities of Daily Living   ADL  Screening (condition at time of admission) Independently performs ADLs?: No Does the patient have a NEW difficulty with bathing/dressing/toileting/self-feeding that is expected to last >3 days?: Yes (Initiates electronic notice to provider for possible OT consult) Does the patient have a NEW difficulty with getting in/out of bed, walking, or climbing stairs that is expected to last >3 days?: Yes (Initiates electronic notice to provider for possible PT consult) Does the patient have a NEW difficulty with communication that is expected to last >3 days?: Yes (Initiates electronic notice to provider for possible SLP consult) Is the patient deaf or have difficulty hearing?: Yes Does the patient have difficulty seeing, even when wearing glasses/contacts?: No Does the patient have difficulty concentrating, remembering, or making decisions?: Yes  Permission Sought/Granted                  Emotional Assessment Appearance:: Appears stated age       Alcohol / Substance Use: Not Applicable Psych Involvement: No (comment)  Admission diagnosis:  Hypokalemia [E87.6] Nephrolithiasis [N20.0] Acute cystitis with hematuria [N30.01] Patient Active Problem List   Diagnosis Date Noted   Hypokalemia 10/17/2023   Nephrolithiasis 10/17/2023   Pressure injury of skin 10/17/2023   Hyperlipidemia associated with type 2 diabetes mellitus (HCC) 07/19/2020   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) 07/19/2020   Aortic atherosclerosis (HCC) 07/19/2020   Protein-calorie malnutrition (HCC) 07/19/2020   Hyperlipidemia  Postoperative anemia due to acute blood loss    Closed comminuted intertrochanteric fracture of proximal end of left femur (HCC) 07/12/2020   Leukocytosis 07/12/2020   Hyperglycemia due to diabetes mellitus (HCC) 07/12/2020   Transaminitis 07/12/2020   Type 2 diabetes mellitus with hyperlipidemia (HCC) 09/27/2019   Essential hypertension 09/27/2019   Fall 09/26/2019   Dementia (HCC) 09/26/2019    Syncope 09/26/2019   PCP:  Omie Bickers, MD Pharmacy:   [facility] [phone] - [place], [place] - [address] AT [facility] OF [address] & [address] [place] [address] Phone: [phone] Fax: [phone]  [facility] - [place], [place] - [address] [place] [address] Phone: [phone] Fax: [phone]  [id number] - [place], [place] - [address] [place] [address] Phone: [phone] Fax: [phone]  [facility] #[id number] - [place], [place] - [address] AT SEC OF [address] & [address] [place] [address] Phone: [phone] Fax: [phone]     Social Drivers of Health (SDOH) Social History: SDOH Screenings   Food Insecurity: No Food Insecurity (10/17/2023)  Housing: High Risk (10/17/2023)  Transportation Needs: No Transportation Needs (10/17/2023)  Utilities: Not At Risk (10/17/2023)  Social Connections: Socially Isolated (10/17/2023)  Tobacco Use: Medium Risk (07/17/2021)   SDOH Interventions:     Readmission Risk Interventions    10/20/2023    9:58 AM  Readmission Risk Prevention Plan  Medication Screening Complete  Transportation Screening Complete

## 2023-10-21 DIAGNOSIS — E1169 Type 2 diabetes mellitus with other specified complication: Secondary | ICD-10-CM | POA: Diagnosis not present

## 2023-10-21 DIAGNOSIS — I10 Essential (primary) hypertension: Secondary | ICD-10-CM | POA: Diagnosis not present

## 2023-10-21 DIAGNOSIS — N200 Calculus of kidney: Secondary | ICD-10-CM | POA: Diagnosis not present

## 2023-10-21 DIAGNOSIS — E876 Hypokalemia: Secondary | ICD-10-CM | POA: Diagnosis not present

## 2023-10-21 MED ORDER — MEDIHONEY WOUND/BURN DRESSING EX PSTE: 1.0000 | PASTE | Freq: Every day | CUTANEOUS | 2 refills | Status: AC

## 2023-10-21 MED ORDER — ATENOLOL 25 MG PO TABS: 25.0000 mg | ORAL_TABLET | Freq: Every day | ORAL | 1 refills | Status: AC

## 2023-10-21 MED ORDER — TAMSULOSIN HCL 0.4 MG PO CAPS: 0.4000 mg | ORAL_CAPSULE | Freq: Every day | ORAL | 2 refills | Status: AC

## 2023-10-21 NOTE — Evaluation (Signed)
 Occupational Therapy Evaluation Patient Details Name: Misty Price MRN: [id number] DOB: 06-Jun-1935 Today's Date: 10/21/2023   History of Present Illness   Misty Price is a 88 y.o. female with medical history significant of advanced dementia, depression, T2DM and calorie protein malnutrition who presented with altered mental status.      For the last 5 days patient has been living with one of her daughters, prior to this she was under the care of other daughter. Unclear the patient's health status prior to last 5 days, but since she was moved, she was noted to have very poor oral intake. She has been very somnolent and hyporeactive, non verbal.   All the information is from her daughter at the beside, patient not able to give any history due to altered mental status and cognitive impairment.       Apparently her dementia is very severe, and she has been non ambulatory, wheelchair bound for 5 years.   She has been not taking her medications for long time. There is no evidence of pelvic pain or changes with her urination. No evidence of back pain. She has been non verbal.     Clinical Impressions Pt anxious and reluctant to participate in OT and PT co-evaluation/treatment. Pt required max A for bed mobility and max to total for transfer to chair with RW. Difficult to assess ADL and UE due to pt's cognitive deficits. Based on cognition alone pt required much assist for ADL's. Pt left in the chair with chair alarm set and call bell within reach. Pt will benefit from continued OT in the hospital and recommended venue below to increase strength, balance, and endurance for safe ADL's.        If plan is discharge home, recommend the following:   A lot of help with walking and/or transfers;A lot of help with bathing/dressing/bathroom;Assistance with cooking/housework;Assist for transportation;Help with stairs or ramp for entrance;Direct supervision/assist for medications management;Supervision due  to cognitive status     Functional Status Assessment   Patient has had a recent decline in their functional status and/or demonstrates limited ability to make significant improvements in function in a reasonable and predictable amount of time     Equipment Recommendations   None recommended by OT             Precautions/Restrictions   Precautions Precautions: Fall Recall of Precautions/Restrictions: Impaired Restrictions Weight Bearing Restrictions Per Provider Order: No     Mobility Bed Mobility Overal bed mobility: Needs Assistance Bed Mobility: Supine to Sit     Supine to sit: Max assist     General bed mobility comments: much assist; pt very anxious    Transfers Overall transfer level: Needs assistance Equipment used: Rolling walker (2 wheels) Transfers: Sit to/from Stand, Bed to chair/wheelchair/BSC Sit to Stand: Max assist Stand pivot transfers: Max assist, Total assist         General transfer comment: much assist; poor balance; legs buckling      Balance Overall balance assessment: Needs assistance Sitting-balance support: Feet supported, No upper extremity supported Sitting balance-Leahy Scale: Fair Sitting balance - Comments: seated at EOB   Standing balance support: During functional activity, Bilateral upper extremity supported Standing balance-Leahy Scale: Poor Standing balance comment: with RW                           ADL either performed or assessed with clinical judgement   ADL Overall ADL's : Needs assistance/impaired  Grooming: Moderate assistance;Maximal assistance;Sitting   Upper Body Bathing: Maximal assistance;Sitting   Lower Body Bathing: Maximal assistance;Sitting/lateral leans   Upper Body Dressing : Maximal assistance;Sitting   Lower Body Dressing: Maximal assistance;Sitting/lateral leans   Toilet Transfer: Maximal assistance;Rolling walker (2 wheels);Total assistance;Stand-pivot   Toileting-  Clothing Manipulation and Hygiene: Maximal assistance;Total assistance;Bed level               Vision Baseline Vision/History:  (unsure of baseline vision) Vision Assessment?:  (difficult to assess)     Perception Perception: Not tested       Praxis Praxis: Not tested       Pertinent Vitals/Pain Pain Assessment Pain Assessment: Faces Faces Pain Scale: Hurts little more Pain Location: during movement Pain Descriptors / Indicators: Grimacing, Moaning Pain Intervention(s): Limited activity within patient's tolerance, Monitored during session, Repositioned     Extremity/Trunk Assessment Upper Extremity Assessment Upper Extremity Assessment: Difficult to assess due to impaired cognition (R UE limited to near 75% P/ROM shoulder flexion; L UE near full P/ROM for shoulder flexion; Able to grasp RW.)   Lower Extremity Assessment Lower Extremity Assessment: Defer to PT evaluation   Cervical / Trunk Assessment Cervical / Trunk Assessment: Kyphotic   Communication Communication Communication: No apparent difficulties Factors Affecting Communication: Hearing impaired   Cognition Arousal: Alert Behavior During Therapy: Anxious Cognition: History of cognitive impairments             OT - Cognition Comments: Pt very anxious and reluctant to transfer to chair.                 Following commands: Impaired Following commands impaired: Follows one step commands inconsistently     Cueing  General Comments   Cueing Techniques: Verbal cues;Tactile cues                 Home Living Family/patient expects to be discharged to:: Private residence Living Arrangements: Children Available Help at Discharge: Family;Available 24 hours/day Type of Home: House Home Access: Stairs to enter [employer] of Steps: 2 Entrance Stairs-Rails: Right;Left;Can reach both Home Layout: One level     Bathroom Shower/Tub: Chief Strategy Officer:  Standard Bathroom Accessibility: Yes   Home Equipment: Cane - single point;Rolling Walker (2 wheels)          Prior Functioning/Environment Prior Level of Function : Needs assist;Patient poor historian/Family not available       Physical Assist : Mobility (physical) Mobility (physical): Bed mobility;Transfers   Mobility Comments: Assisted for transfers, wheelchair bound, non-ambulatory ADLs Comments: Assisted by family (information per chart)    OT Problem List: Decreased strength;Decreased range of motion;Decreased activity tolerance;Impaired balance (sitting and/or standing);Decreased cognition   OT Treatment/Interventions: Self-care/ADL training;Therapeutic exercise;Therapeutic activities;Patient/family education;Balance training;DME and/or AE instruction;Cognitive remediation/compensation      OT Goals(Current goals can be found in the care plan section)   Acute Rehab OT Goals Patient Stated Goal: none stated OT Goal Formulation: Patient unable to participate in goal setting Time For Goal Achievement: 11/04/23 Potential to Achieve Goals: Fair   OT Frequency:  Min 1X/week    Co-evaluation PT/OT/SLP Co-Evaluation/Treatment: Yes Reason for Co-Treatment: To address functional/ADL transfers   OT goals addressed during session: ADL's and self-care                       End of Session Equipment Utilized During Treatment: Rolling walker (2 wheels);Gait belt Nurse Communication: Mobility status  Activity Tolerance: Other (comment) (Pt very anxious) Patient left:  in chair;with call bell/phone within reach;with chair alarm set  OT Visit Diagnosis: Unsteadiness on feet (R26.81);Other abnormalities of gait and mobility (R26.89);Muscle weakness (generalized) (M62.81);Other symptoms and signs involving cognitive function                Time: 1610-9604 OT Time Calculation (min): 10 min Charges:  OT General Charges $OT Visit: 1 Visit OT Evaluation $OT Eval Low  Complexity: 1 Low  Ellean Firman OT, MOT  Thurnell Floss 10/21/2023, 9:50 AM

## 2023-10-21 NOTE — Discharge Instructions (Signed)
 IMPORTANT INFORMATION: PAY CLOSE ATTENTION   PHYSICIAN DISCHARGE INSTRUCTIONS  Follow with Primary care provider  Benita Stabile, MD  and other consultants as instructed by your Hospitalist Physician  SEEK MEDICAL CARE OR RETURN TO EMERGENCY ROOM IF SYMPTOMS COME BACK, WORSEN OR NEW PROBLEM DEVELOPS   Please note: You were cared for by a hospitalist during your hospital stay. Every effort will be made to forward records to your primary care provider.  You can request that your primary care provider send for your hospital records if they have not received them.  Once you are discharged, your primary care physician will handle any further medical issues. Please note that NO REFILLS for any discharge medications will be authorized once you are discharged, as it is imperative that you return to your primary care physician (or establish a relationship with a primary care physician if you do not have one) for your post hospital discharge needs so that they can reassess your need for medications and monitor your lab values.  Please get a complete blood count and chemistry panel checked by your Primary MD at your next visit, and again as instructed by your Primary MD.  Get Medicines reviewed and adjusted: Please take all your medications with you for your next visit with your Primary MD  Laboratory/radiological data: Please request your Primary MD to go over all hospital tests and procedure/radiological results at the follow up, please ask your primary care provider to get all Hospital records sent to his/her office.  In some cases, they will be blood work, cultures and biopsy results pending at the time of your discharge. Please request that your primary care provider follow up on these results.  If you are diabetic, please bring your blood sugar readings with you to your follow up appointment with primary care.    Please call and make your follow up appointments as soon as possible.    Also Note the  following: If you experience worsening of your admission symptoms, develop shortness of breath, life threatening emergency, suicidal or homicidal thoughts you must seek medical attention immediately by calling 911 or calling your MD immediately  if symptoms less severe.  You must read complete instructions/literature along with all the possible adverse reactions/side effects for all the Medicines you take and that have been prescribed to you. Take any new Medicines after you have completely understood and accpet all the possible adverse reactions/side effects.   Do not drive when taking Pain medications or sleeping medications (Benzodiazepines)  Do not take more than prescribed Pain, Sleep and Anxiety Medications. It is not advisable to combine anxiety,sleep and pain medications without talking with your primary care practitioner  Special Instructions: If you have smoked or chewed Tobacco  in the last 2 yrs please stop smoking, stop any regular Alcohol  and or any Recreational drug use.  Wear Seat belts while driving.  Do not drive if taking any narcotic, mind altering or controlled substances or recreational drugs or alcohol.

## 2023-10-21 NOTE — Plan of Care (Signed)
   Problem: Elimination: Goal: Will not experience complications related to bowel motility Outcome: Progressing   Problem: Pain Managment: Goal: General experience of comfort will improve and/or be controlled Outcome: Progressing   Problem: Safety: Goal: Ability to remain free from injury will improve Outcome: Progressing

## 2023-10-21 NOTE — Plan of Care (Signed)
  Problem: Acute Rehab OT Goals (only OT should resolve) Goal: Pt. Will Perform Grooming Flowsheets (Taken 10/21/2023 [phone]) Pt Will Perform Grooming: with supervision Goal: Pt. Will Perform Upper Body Dressing Flowsheets (Taken 10/21/2023 [phone]) Pt Will Perform Upper Body Dressing: with supervision Goal: Pt/Caregiver Will Perform Home Exercise Program Flowsheets (Taken 10/21/2023 [phone]) Pt/caregiver will Perform Home Exercise Program:  Increased ROM  Increased strength  Both right and left upper extremity  With minimal assist  Marianna Cid OT, MOT

## 2023-10-21 NOTE — Progress Notes (Signed)
 Physical Therapy Treatment Patient Details Name: Misty Price MRN: [id number] DOB: April 09, 1936 Today's Date: 10/21/2023   History of Present Illness Misty Price is a 88 y.o. female with medical history significant of advanced dementia, depression, T2DM and calorie protein malnutrition who presented with altered mental status.      For the last 5 days patient has been living with one of her daughters, prior to this she was under the care of other daughter. Unclear the patient's health status prior to last 5 days, but since she was moved, she was noted to have very poor oral intake. She has been very somnolent and hyporeactive, non verbal.   All the information is from her daughter at the beside, patient not able to give any history due to altered mental status and cognitive impairment.       Apparently her dementia is very severe, and she has been non ambulatory, wheelchair bound for 5 years.   She has been not taking her medications for long time. There is no evidence of pelvic pain or changes with her urination. No evidence of back pain. She has been non verbal.    PT Comments  Patient limited during session due to fatigue and anxiety during mobility. Patient was received supine in bed, initially declining mobility. Patient required max assist for supine>sit, STS, and bed >chair transfer with RW. During transfer, pt BLE demonstrated buckling requiring swift stand pivot transfer. Pt repositioned in recliner, with chair alarm set and call bell within reach. Patient will benefit from continued skilled physical therapy acutely and in recommended venue with appropriate support at home in order to improve current function and QOL.       If plan is discharge home, recommend the following: A lot of help with bathing/dressing/bathroom;A lot of help with walking and/or transfers;Help with stairs or ramp for entrance;Assistance with cooking/housework   Can travel by private vehicle        Equipment  Recommendations  None recommended by PT    Recommendations for Other Services       Precautions / Restrictions Precautions Precautions: Fall Recall of Precautions/Restrictions: Impaired Restrictions Weight Bearing Restrictions Per Provider Order: No     Mobility  Bed Mobility Overal bed mobility: Needs Assistance Bed Mobility: Supine to Sit     Supine to sit: Max assist     General bed mobility comments: Max-total A, pt anxious and crying during    Transfers Overall transfer level: Needs assistance Equipment used: Rolling walker (2 wheels) Transfers: Sit to/from Stand, Bed to chair/wheelchair/BSC Sit to Stand: Max assist Stand pivot transfers: Max assist, Total assist         General transfer comment: Max/Total A w/ RW. BLE buckling mid-transfer.    Ambulation/Gait     General Gait Details: Unable to assess due to weakness and pt w/c/bed bound at baseline   Stairs       Wheelchair Mobility     Tilt Bed    Modified Rankin (Stroke Patients Only)       Balance Overall balance assessment: Needs assistance Sitting-balance support: Feet supported, No upper extremity supported Sitting balance-Leahy Scale: Fair Sitting balance - Comments: seated at EOB   Standing balance support: During functional activity, Bilateral upper extremity supported Standing balance-Leahy Scale: Poor Standing balance comment: w/ RW and max/total A. LE buckling occurs          Communication Communication Communication: No apparent difficulties Factors Affecting Communication: Hearing impaired  Cognition Arousal: Alert Behavior During Therapy: Anxious, Agitated  PT - Cognitive impairments: History of cognitive impairments       Following commands: Impaired Following commands impaired: Follows one step commands inconsistently    Cueing Cueing Techniques: Verbal cues, Tactile cues  Exercises      General Comments        Pertinent Vitals/Pain Pain  Assessment Pain Assessment: Faces Faces Pain Scale: Hurts little more Pain Descriptors / Indicators: Grimacing, Moaning Pain Intervention(s): Limited activity within patient's tolerance, Monitored during session, Repositioned    Home Living Family/patient expects to be discharged to:: Private residence Living Arrangements: Children Available Help at Discharge: Family;Available 24 hours/day Type of Home: House Home Access: Stairs to enter Entrance Stairs-Rails: Right;Left;Can reach both Entrance Stairs-Number of Steps: 2   Home Layout: One level Home Equipment: Cane - single Librarian, academic (2 wheels)      Prior Function            PT Goals (current goals can now be found in the care plan section) Acute Rehab PT Goals Patient Stated Goal: return home PT Goal Formulation: With patient/family Time For Goal Achievement: 10/24/23 Potential to Achieve Goals: Good Progress towards PT goals: Progressing toward goals    Frequency    Min 3X/week      PT Plan      Co-evaluation PT/OT/SLP Co-Evaluation/Treatment: Yes Reason for Co-Treatment: To address functional/ADL transfers PT goals addressed during session: Mobility/safety with mobility OT goals addressed during session: ADL's and self-care      AM-PAC PT "6 Clicks" Mobility   Outcome Measure  Help needed turning from your back to your side while in a flat bed without using bedrails?: A Lot Help needed moving from lying on your back to sitting on the side of a flat bed without using bedrails?: A Lot Help needed moving to and from a bed to a chair (including a wheelchair)?: A Lot Help needed standing up from a chair using your arms (e.g., wheelchair or bedside chair)?: A Lot Help needed to walk in hospital room?: Total Help needed climbing 3-5 steps with a railing? : Total 6 Click Score: 10    End of Session Equipment Utilized During Treatment: Gait belt Activity Tolerance: Patient limited by fatigue;Patient  limited by pain Patient left: in chair;with call bell/phone within reach;with chair alarm set Nurse Communication: Mobility status PT Visit Diagnosis: Unsteadiness on feet (R26.81);Other abnormalities of gait and mobility (R26.89);Muscle weakness (generalized) (M62.81)     Time: 9604-5409 PT Time Calculation (min) (ACUTE ONLY): 8 min  Charges:    $Therapeutic Activity: 8-22 mins PT General Charges $$ ACUTE PT VISIT: 1 Visit                     10:57 AM, 10/21/23 Marysue Sola, PT, DPT [place] [facility]

## 2023-10-21 NOTE — Discharge Summary (Signed)
 Physician Discharge Summary  Misty Price YQM:[id number] DOB: 04-Oct-1935 DOA: 10/16/2023  PCP: Omie Bickers, MD  Admit date: 10/16/2023 Discharge date: 10/21/2023  Admitted From:  Home  Disposition: Home with [facility]   Recommendations for Outpatient Follow-up:  Follow up with PCP in 1 weeks Please obtain BMP/CBC in one week Please consider outpatient palliative medicine referral  Home Health:  PT  Discharge Condition: STABLE   CODE STATUS: FULL DIET: regular foods as tolerated    Brief Hospitalization Summary: Please see all hospital notes, images, labs for full details of the hospitalization. Admission Provider HPI:   88 y.o. female with medical history significant of advanced dementia, depression, T2DM and calorie protein malnutrition who presented with altered mental status.    For the last 5 days patient has been living with one of her daughters, prior to this she was under the care of other daughter. Unclear the patient's health status prior to last 5 days, but since she was moved, she was noted to have very poor oral intake. She has been very somnolent and hyporeactive, non verbal.  All the information is from her daughter at the beside, patient not able to give any history due to altered mental status and cognitive impairment.     Apparently her dementia is very severe, and she has been non ambulatory, wheelchair bound for 5 years.  She has been not taking her medications for long time. There is no evidence of pelvic pain or changes with her urination. No evidence of back pain. She has been non verbal.   Hospital Course by listed problems addressed   Nephrolithiasis Right obstructive stone,  complicated with urine infection (no sepsis)  Plan to continue broad spectrum antibiotic therapy with ceftriaxone.  Follow up cultures, cell count and temperature curve Added flomax  Follow up with urology recommendations per Dr. Claretta Croft to try medical expulsive therapy  If patient spikes  fever over 101 will likely need to have ureteral stent placed but patient never had a fever  Daughter unsure patient would survive surgical procedure. She was agreeable to a palliative medicine consultation for in depth goals of care discussion.  Fortunately no fevers and seems to be tolerating tamsulosin daily Not sure if kidney stone was passed or not  Continue current treatments.  Daughter would like to take home today when hospital bed delivered Follow up with urology [facility] outpatient  Strongly advised outpatient palliative medicine services    Hypokalemia - repleted  Continue repletion, recheck in AM   Mg is repleted at 2.1   Protein-calorie malnutrition  Consult nutrition for evaluation and supplements.    Depression due to dementia  Patient with acute metabolic encephalopathy which is resolving  Improving with delirium precautions and family support at bedside    Positive sacrum pressure ulcer stage 3 present on admission, consult wound care.  Pressure Injury 10/17/23 Sacrum Bilateral Stage 2 -  Partial thickness loss of dermis presenting as a shallow open injury with a red, pink wound bed without slough. (Active)  10/17/23 0315  Location: Sacrum  Location Orientation: Bilateral  Staging: Stage 2 -  Partial thickness loss of dermis presenting as a shallow open injury with a red, pink wound bed without slough.  Wound Description (Comments):   Present on Admission: Yes  Waiting on air mattress to be delivered    End stages of Dementia  Multiple chronic fractures, pelvic and spine, very poor prognosis.  Consult palliative care.  Pt has very poor oral intake and  not drinking much and not eating.  Assist with feeding order added Daughter leaning towards not moving forward with any surgical procedures Daughter requested to take pt home today.   Outpatient palliative medicine services recommended     Essential hypertension Continue blood pressure monitoring Pt had  stopped taking most of her home meds in last 30 days Atenolol 25 mg daily ordered     Type 2 diabetes mellitus with hyperlipidemia  Glucose is not elevated on admission Hold on insulin  coverage due to risk of hypoglycemia.  Continue close monitoring  Continue statin therapy    Discharge Diagnoses:  Principal Problem:   Hypokalemia Active Problems:   Type 2 diabetes mellitus with hyperlipidemia (HCC)   Essential hypertension   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)   Protein-calorie malnutrition (HCC)   Nephrolithiasis   Pressure injury of skin   Discharge Instructions: Discharge Instructions     Ambulatory referral to Urology   Complete by: As directed    Hospital follow up for ureterolithiasis      Allergies as of 10/21/2023   No Active Allergies      Medication List     STOP taking these medications    aspirin  EC 81 MG tablet   cyanocobalamin  1000 MCG tablet Commonly known as: VITAMIN B12   Lantus  SoloStar 100 UNIT/ML Solostar Pen Generic drug: insulin  glargine   lisinopril  20 MG tablet Commonly known as: ZESTRIL    NON FORMULARY   potassium chloride  SA 20 MEQ tablet Commonly known as: KLOR-CON  M   sertraline  100 MG tablet Commonly known as: ZOLOFT        TAKE these medications    acetaminophen  325 MG tablet Commonly known as: TYLENOL  Take 2 tablets (650 mg total) by mouth every 6 (six) hours as needed for mild pain or fever.   atenolol 25 MG tablet Commonly known as: TENORMIN Take 1 tablet (25 mg total) by mouth daily. Start taking on: Oct 22, 2023   atorvastatin  40 MG tablet Commonly known as: LIPITOR atorvastatin  40 mg tablet  TAKE 1 TABLET BY MOUTH IN THE EVENING FOR HIGH CHOLESTEROL   donepezil  5 MG tablet Commonly known as: ARICEPT    leptospermum manuka honey Pste paste Apply 1 Application topically daily. Cleanse buttocks/sacrum with Vashe wound cleanser, do not rinse and allow to air dry. Apply Medihoney to wound beds daily, cover  with dry gauze and silicone foam. Interchangeable with TheraHoney Apply thin layer (3 mm) to wound. Start taking on: Oct 22, 2023   levETIRAcetam  500 MG tablet Commonly known as: KEPPRA  TAKE 1 TABLET BY MOUTH TWICE DAILY   linagliptin  5 MG Tabs tablet Commonly known as: TRADJENTA  Take 1 tablet (5 mg total) by mouth daily.   memantine  10 MG tablet Commonly known as: NAMENDA  Take 1 tablet (10 mg total) by mouth 2 (two) times daily.   tamsulosin 0.4 MG Caps capsule Commonly known as: FLOMAX Take 1 capsule (0.4 mg total) by mouth daily after breakfast. Start taking on: Oct 22, 2023               Durable Medical Equipment  (From admission, onward)           Start     Ordered   10/20/23 1126  For home use only DME Hospital bed  Once       Question Answer Comment  Length of Need Lifetime   The above medical condition requires: Patient requires the ability to reposition frequently   Bed type Semi-electric  Support Surface: Low Air loss Mattress      10/20/23 1126   10/19/23 1612  For home use only DME Air overlay mattress  Once        10/19/23 1611            Follow-up Information     Omie Bickers, MD. Schedule an appointment as soon as possible for a visit in 1 week(s).   Specialty: Internal Medicine Why: Hospital Follow Up Contact information: [address] [place] [facility] [address] [phone]                No Active Allergies Allergies as of 10/21/2023   No Active Allergies      Medication List     STOP taking these medications    aspirin  EC 81 MG tablet   cyanocobalamin  1000 MCG tablet Commonly known as: VITAMIN B12   Lantus  SoloStar 100 UNIT/ML Solostar Pen Generic drug: insulin  glargine   lisinopril  20 MG tablet Commonly known as: ZESTRIL    NON FORMULARY   potassium chloride  SA 20 MEQ tablet Commonly known as: KLOR-CON  M   sertraline  100 MG tablet Commonly known as: ZOLOFT        TAKE these medications     acetaminophen  325 MG tablet Commonly known as: TYLENOL  Take 2 tablets (650 mg total) by mouth every 6 (six) hours as needed for mild pain or fever.   atenolol 25 MG tablet Commonly known as: TENORMIN Take 1 tablet (25 mg total) by mouth daily. Start taking on: Oct 22, 2023   atorvastatin  40 MG tablet Commonly known as: LIPITOR atorvastatin  40 mg tablet  TAKE 1 TABLET BY MOUTH IN THE EVENING FOR HIGH CHOLESTEROL   donepezil  5 MG tablet Commonly known as: ARICEPT    leptospermum manuka honey Pste paste Apply 1 Application topically daily. Cleanse buttocks/sacrum with Vashe wound cleanser, do not rinse and allow to air dry. Apply Medihoney to wound beds daily, cover with dry gauze and silicone foam. Interchangeable with TheraHoney Apply thin layer (3 mm) to wound. Start taking on: Oct 22, 2023   levETIRAcetam  500 MG tablet Commonly known as: KEPPRA  TAKE 1 TABLET BY MOUTH TWICE DAILY   linagliptin  5 MG Tabs tablet Commonly known as: TRADJENTA  Take 1 tablet (5 mg total) by mouth daily.   memantine  10 MG tablet Commonly known as: NAMENDA  Take 1 tablet (10 mg total) by mouth 2 (two) times daily.   tamsulosin 0.4 MG Caps capsule Commonly known as: FLOMAX Take 1 capsule (0.4 mg total) by mouth daily after breakfast. Start taking on: Oct 22, 2023               Durable Medical Equipment  (From admission, onward)           Start     Ordered   10/20/23 1126  For home use only DME Hospital bed  Once       Question Answer Comment  Length of Need Lifetime   The above medical condition requires: Patient requires the ability to reposition frequently   Bed type Semi-electric   Support Surface: Low Air loss Mattress      10/20/23 1126   10/19/23 1612  For home use only DME Air overlay mattress  Once        10/19/23 1611            Procedures/Studies: CT CHEST ABDOMEN PELVIS W CONTRAST Result Date: 10/16/2023 CLINICAL DATA:  Sepsis EXAM: CT CHEST, ABDOMEN, AND  PELVIS WITH  CONTRAST TECHNIQUE: Multidetector CT imaging of the chest, abdomen and pelvis was performed following the standard protocol during bolus administration of intravenous contrast. RADIATION DOSE REDUCTION: This exam was performed according to the departmental dose-optimization program which includes automated exposure control, adjustment of the mA and/or kV according to patient size and/or use of iterative reconstruction technique. CONTRAST:  100mL OMNIPAQUE IOHEXOL 300 MG/ML  SOLN COMPARISON:  None Available. FINDINGS: CT CHEST FINDINGS Cardiovascular: No significant vascular findings. Normal heart size. No pericardial effusion. There are atherosclerotic calcifications of the aorta and coronary arteries. Mediastinum/Nodes: No enlarged mediastinal, hilar, or axillary lymph nodes. Thyroid  gland, trachea, and esophagus demonstrate no significant findings. Lungs/Pleura: There are trace bilateral pleural effusions. There is mild atelectasis in the bilateral lower lobes and lingula. The lungs are otherwise clear. There secretions in the left mainstem bronchus. Musculoskeletal: There is mild compression deformity of T3 which is age indeterminate, but favored as chronic. CT ABDOMEN PELVIS FINDINGS Hepatobiliary: No focal liver abnormality is seen. Status post cholecystectomy. No biliary dilatation. Pancreas: Unremarkable. No pancreatic ductal dilatation or surrounding inflammatory changes. Spleen: Normal in size without focal abnormality. Adrenals/Urinary Tract: There is a 4 mm calculus at the right ureterovesicular junction. There is mild to moderate right-sided hydroureteronephrosis. There is a 2 mm calculus in the left kidney. Otherwise, the left kidney is within normal limits. Adrenal glands and bladder are within normal limits. Stomach/Bowel: There is circumferential rectal wall thickening with presacral edema. There is no bowel obstruction, pneumatosis or free air. The appendix is within normal limits. There  is questionable gastric antral wall thickening in there is wall thickening of the proximal duodenum. There is mild surrounding inflammatory stranding. The stomach is otherwise within normal limits. Vascular/Lymphatic: Aortic atherosclerosis. No enlarged abdominal or pelvic lymph nodes. Reproductive: Uterus and bilateral adnexa are unremarkable. Other: There is no ascites. There is a small fat containing umbilical hernia. There are sutures along the anterior abdominal wall. Musculoskeletal: There are left superior and inferior pubic rami fractures which are likely subacute. Left-sided hip screw and intramedullary nail are present. There also bilateral sacral ala fractures which appear subacute or chronic. The bones are diffusely osteopenic. There is also fracture of the inferior endplate of S1 favored as subacute or chronic. No soft tissue fluid collection or gas. IMPRESSION: 1. 4 mm calculus at the right ureterovesicular junction with mild to moderate right-sided hydroureteronephrosis. 2. Nonobstructing left renal calculus. 3. Trace bilateral pleural effusions with mild bibasilar atelectasis. 4. Secretions in the left mainstem bronchus. 5. Circumferential rectal wall thickening with presacral edema compatible with proctitis. 6. Wall thickening of the proximal duodenum and gastric antrum with surrounding inflammatory stranding worrisome for duodenitis and gastritis. 7. Subacute or chronic fractures of the left superior and inferior pubic rami, bilateral sacral ala, and inferior endplate of S1. 8. Mild compression deformity of T3 is age indeterminate, but favored as chronic. 9. Aortic atherosclerosis. Aortic Atherosclerosis (ICD10-I70.0). Electronically Signed   By: Tyron Gallon M.D.   On: 10/16/2023 21:09     Subjective: Pt has advanced dementia. No specific complaints or signs of distress.    Discharge Exam: Vitals:   10/21/23 0447 10/21/23 1001  BP: 138/87 (!) 140/58  Pulse: 100 (!) 105  Resp: 16    Temp: (!) 97.5 F (36.4 C)   SpO2: 95%    Vitals:   10/20/23 1443 10/20/23 1952 10/21/23 0447 10/21/23 1001  BP: 121/80 115/72 138/87 (!) 140/58  Pulse: 95 98 100 (!) 105  Resp: 17 17 16  Temp: 98.4 F (36.9 C) 99.3 F (37.4 C) (!) 97.5 F (36.4 C)   TempSrc: Oral Oral Oral   SpO2: 99% 99% 95%   Weight:      Height:       General: Pt is alert, awake, not in acute distress, disoriented at baseline Cardiovascular: normal S1/S2 +, no rubs, no gallops Respiratory: CTA bilaterally, no wheezing, no rhonchi Abdominal: Soft, NT, ND, bowel sounds + Extremities: no edema, no cyanosis   The results of significant diagnostics from this hospitalization (including imaging, microbiology, ancillary and laboratory) are listed below for reference.    Microbiology: Recent Results (from the past 240 hours)  Urine Culture     Status: None   Collection Time: 10/17/23 11:02 AM   Specimen: Urine, Catheterized  Result Value Ref Range Status   Specimen Description   Final    URINE, CATHETERIZED Performed at [facility], [address], [place], [place] [address]    Special Requests   Final    NONE Performed at [facility], [address] [place], [place] [address]    Culture   Final    NO GROWTH Performed at [facility], [address], [place], [place] [address]    Report Status 10/18/2023 FINAL  Final     Labs: BNP (last 3 results) No results for input(s): "BNP" in the last 8760 hours. Basic Metabolic Panel: Recent Labs  Lab 10/16/23 1918 10/17/23 0041 10/17/23 0421 10/18/23 0401 10/19/23 0210  NA 138  --  139 138 137  K 2.3* 3.0* 2.9* 3.2* 3.6  CL 95*  --  101 106 106  CO2 29  --  28 29 29   GLUCOSE 251*  --  193* 194* 166*  BUN 17  --  17 12 8   CREATININE 1.11*  --  0.83 0.54 0.50  CALCIUM  7.6*  --  7.3* 7.4* 7.5*  MG  --   --  1.8 2.1  --    Liver Function Tests: Recent Labs  Lab 10/16/23 1918  AST 13*  ALT 10  ALKPHOS 104  BILITOT 1.1  PROT 5.3*   ALBUMIN 2.2*   No results for input(s): "LIPASE", "AMYLASE" in the last 168 hours. No results for input(s): "AMMONIA" in the last 168 hours. CBC: Recent Labs  Lab 10/16/23 1918 10/17/23 0421 10/19/23 0210  WBC 12.3* 12.0* 6.5  NEUTROABS 11.0*  --  4.3  HGB 9.8* 9.6* 9.2*  HCT 31.8* 30.4* 29.3*  MCV 103.2* 104.1* 103.5*  PLT 203 215 210   Cardiac Enzymes: No results for input(s): "CKTOTAL", "CKMB", "CKMBINDEX", "TROPONINI" in the last 168 hours. BNP: Invalid input(s): "POCBNP" CBG: No results for input(s): "GLUCAP" in the last 168 hours. D-Dimer No results for input(s): "DDIMER" in the last 72 hours. Hgb A1c No results for input(s): "HGBA1C" in the last 72 hours. Lipid Profile No results for input(s): "CHOL", "HDL", "LDLCALC", "TRIG", "CHOLHDL", "LDLDIRECT" in the last 72 hours. Thyroid  function studies No results for input(s): "TSH", "T4TOTAL", "T3FREE", "THYROIDAB" in the last 72 hours.  Invalid input(s): "FREET3" Anemia work up No results for input(s): "VITAMINB12", "FOLATE", "FERRITIN", "TIBC", "IRON ", "RETICCTPCT" in the last 72 hours. Urinalysis    Component Value Date/Time   COLORURINE AMBER (A) 10/16/2023 2210   APPEARANCEUR CLOUDY (A) 10/16/2023 2210   LABSPEC 1.041 (H) 10/16/2023 2210   PHURINE 5.0 10/16/2023 2210   GLUCOSEU NEGATIVE 10/16/2023 2210   HGBUR LARGE (A) 10/16/2023 2210   BILIRUBINUR NEGATIVE 10/16/2023 2210   KETONESUR  20 (A) 10/16/2023 2210   PROTEINUR 100 (A) 10/16/2023 2210   NITRITE NEGATIVE 10/16/2023 2210   LEUKOCYTESUR SMALL (A) 10/16/2023 2210   Sepsis Labs Recent Labs  Lab 10/16/23 1918 10/17/23 0421 10/19/23 0210  WBC 12.3* 12.0* 6.5   Microbiology Recent Results (from the past 240 hours)  Urine Culture     Status: None   Collection Time: 10/17/23 11:02 AM   Specimen: Urine, Catheterized  Result Value Ref Range Status   Specimen Description   Final    URINE, CATHETERIZED Performed at [facility], [address] [place], [place] [address]    Special Requests   Final    NONE Performed at [facility], [address], [place], [place] [address]    Culture   Final    NO GROWTH Performed at [facility], [address] [place], [place] [address]    Report Status 10/18/2023 FINAL  Final   Time coordinating discharge: 38 mins   SIGNED:  Faustino Hook, MD  [facility] 10/21/2023, 10:33 AM How to contact the [facility] Attending or Consulting provider 7A - 7P or covering provider during after hours 7P -7A, for this patient?  Check the care team in [facility] and look for a) attending/consulting TRH provider listed and b) the TRH team listed Log into [email or web address] and use [facility]'s universal password to access. If you do not have the password, please contact the hospital operator. Locate the TRH provider you are looking for under [facility] and page to a number that you can be directly reached. If you still have difficulty reaching the provider, please page the [facility] (Director on Call) for the Hospitalists listed on amion for assistance.

## 2023-10-21 NOTE — TOC Transition Note (Signed)
 Transition of Care [facility]) - Discharge Note   Patient Details  Name: Misty Price MRN: [id number] Date of Birth: 12/20/1935  Transition of Care [facility]) CM/SW Contact:  Grandville Lax, LCSWA Phone Number: 10/21/2023, 11:08 AM  Clinical Narrative:    CSW updated that pt is medically stable for D/C home today. CSW spoke to [place] with [employer] who confirms hospital bed will be delivered today. CSW sent [facility] referral to [facility] with Gasper Karst, MD placed [facility] orders. CSW sent referral for palliative with [facility] via [facility]. CSW spoke with pts daughter who is understanding of D/C plan and states pt can arrive to home before hospital bed delivers. CSW updated RN that EMS can be called for transport when they are ready. TOC signing off.   Final next level of care: Home w Home Health Services Barriers to Discharge: Barriers Resolved   Patient Goals and CMS Choice Patient states their goals for this hospitalization and ongoing recovery are:: return home CMS Medicare.gov Compare Post Acute Care list provided to:: Patient Represenative (must comment) Choice offered to / list presented to : Adult Children [place] ownership interest in [facility].provided to:: Adult Children    Discharge Placement                  Name of family member notified: Daughter Patient and family notified of of transfer: 10/21/23  Discharge Plan and Services Additional resources added to the After Visit Summary for   In-house Referral: Clinical Social Work Discharge Planning Services: CM Consult Post Acute Care Choice: Home Health, Durable Medical Equipment          DME Arranged: Hospital bed DME Agency: [employer] Date DME Agency Contacted: 10/21/23   Representative spoke with at DME Agency: Gladys Lamp HH Arranged: PT HH Agency: [facility] Date [facility] Agency Contacted: 10/21/23   Representative spoke with at [name] Agency: Randel Buss  Social Drivers of Health (SDOH) Interventions SDOH Screenings   Food  Insecurity: No Food Insecurity (10/17/2023)  Housing: High Risk (10/17/2023)  Transportation Needs: No Transportation Needs (10/17/2023)  Utilities: Not At Risk (10/17/2023)  Social Connections: Socially Isolated (10/17/2023)  Tobacco Use: Medium Risk (07/17/2021)     Readmission Risk Interventions    10/20/2023    9:58 AM  Readmission Risk Prevention Plan  Medication Screening Complete  Transportation Screening Complete

## 2023-10-21 NOTE — Plan of Care (Signed)
  Problem: Education: Goal: Knowledge of General Education information will improve Description: Including pain rating scale, medication(s)/side effects and non-pharmacologic comfort measures Outcome: Adequate for Discharge   Problem: Health Behavior/Discharge Planning: Goal: Ability to manage health-related needs will improve Outcome: Adequate for Discharge   Problem: Clinical Measurements: Goal: Ability to maintain clinical measurements within normal limits will improve Outcome: Adequate for Discharge Goal: Will remain free from infection Outcome: Adequate for Discharge Goal: Diagnostic test results will improve Outcome: Adequate for Discharge Goal: Respiratory complications will improve Outcome: Adequate for Discharge Goal: Cardiovascular complication will be avoided Outcome: Adequate for Discharge   Problem: Activity: Goal: Risk for activity intolerance will decrease Outcome: Adequate for Discharge   Problem: Nutrition: Goal: Adequate nutrition will be maintained Outcome: Adequate for Discharge   Problem: Coping: Goal: Level of anxiety will decrease Outcome: Adequate for Discharge   Problem: Elimination: Goal: Will not experience complications related to bowel motility Outcome: Adequate for Discharge Goal: Will not experience complications related to urinary retention Outcome: Adequate for Discharge   Problem: Pain Managment: Goal: General experience of comfort will improve and/or be controlled Outcome: Adequate for Discharge   Problem: Safety: Goal: Ability to remain free from injury will improve Outcome: Adequate for Discharge   Problem: Skin Integrity: Goal: Risk for impaired skin integrity will decrease Outcome: Adequate for Discharge   Problem: Acute Rehab PT Goals(only PT should resolve) Goal: Pt Will Go Supine/Side To Sit Outcome: Adequate for Discharge Goal: Patient Will Perform Sitting Balance Outcome: Adequate for Discharge Goal: Patient Will  Transfer Sit To/From Stand Outcome: Adequate for Discharge Goal: Pt Will Transfer Bed To Chair/Chair To Bed Outcome: Adequate for Discharge   Problem: Acute Rehab OT Goals (only OT should resolve) Goal: Pt. Will Perform Grooming Outcome: Adequate for Discharge Goal: Pt. Will Perform Upper Body Dressing Outcome: Adequate for Discharge Goal: Pt/Caregiver Will Perform Home Exercise Program Outcome: Adequate for Discharge

## 2023-10-21 NOTE — Care Management Important Message (Signed)
 Important Message  Patient Details  Name: Misty Price MRN: [id number] Date of Birth: [birth year]   Important Message Given:  Yes - Medicare IM     Shakara Tweedy L Yolunda Kloos 10/21/2023, 11:20 AM

## 2023-10-24 DIAGNOSIS — F0280 Dementia in other diseases classified elsewhere without behavioral disturbance: Secondary | ICD-10-CM | POA: Diagnosis not present

## 2023-10-24 DIAGNOSIS — Z7189 Other specified counseling: Secondary | ICD-10-CM | POA: Diagnosis not present

## 2023-10-24 DIAGNOSIS — Z09 Encounter for follow-up examination after completed treatment for conditions other than malignant neoplasm: Secondary | ICD-10-CM | POA: Diagnosis not present

## 2023-10-24 DIAGNOSIS — G309 Alzheimer's disease, unspecified: Secondary | ICD-10-CM | POA: Diagnosis not present

## 2023-10-24 DIAGNOSIS — E876 Hypokalemia: Secondary | ICD-10-CM | POA: Diagnosis not present

## 2023-10-27 ENCOUNTER — Ambulatory Visit: Admitting: Urology

## 2023-10-27 ENCOUNTER — Encounter: Payer: Self-pay | Admitting: Urology

## 2023-10-27 VITALS — BP 92/59 | HR 63

## 2023-10-27 DIAGNOSIS — N201 Calculus of ureter: Secondary | ICD-10-CM

## 2023-10-27 NOTE — Progress Notes (Signed)
 10/27/2023 10:35 AM   Misty Price 04-Jun-1935 [id number]  Referring provider: Rayfield Cairo, MD [address] [place],  [place] [address]  No chief complaint on file.   HPI:  F/u -   1) right ureteral stone - seen in consult with Dr. Wyonia Hefty. May 2025 CT - a 3mm right distal ureteral calculus with moderate hydronephrosis. Started MET. D/c Cr 0.5. Urine cx no growth. Discussed risk sepsis, UTI.   She is here with her daughter. Has severe memory issues - dementia. Uses a diaper or pure wick. Hasn't seen a stone pass, but UA clear and no complaints.     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 If you have any questions, ask your nurse or doctor.          acetaminophen  325 MG tablet Commonly known as: TYLENOL  Take 2 tablets (650 mg total) by mouth every 6 (six) hours as needed for mild pain or fever.   atenolol  25 MG tablet Commonly known as: TENORMIN  Take 1 tablet (25 mg total) by mouth daily.   atorvastatin  40 MG tablet Commonly known as: LIPITOR atorvastatin  40 mg tablet  TAKE 1 TABLET BY MOUTH IN THE  EVENING FOR HIGH CHOLESTEROL   donepezil  5 MG tablet Commonly known as: ARICEPT    leptospermum manuka honey Pste paste Apply 1 Application topically daily. Cleanse buttocks/sacrum with Vashe wound cleanser, do not rinse and allow to air dry. Apply Medihoney to wound beds daily, cover with dry gauze and silicone foam. Interchangeable with TheraHoney Apply thin layer (3 mm) to wound.   levETIRAcetam  500 MG tablet Commonly known as: KEPPRA  TAKE 1 TABLET BY MOUTH TWICE DAILY   linagliptin  5 MG Tabs tablet Commonly known as: TRADJENTA  Take 1 tablet (5 mg total) by mouth daily.   memantine  10 MG tablet Commonly known as: NAMENDA  Take 1 tablet (10 mg total) by mouth 2 (two) times daily.   tamsulosin  0.4 MG Caps capsule Commonly known as: FLOMAX  Take 1 capsule (0.4 mg total) by mouth daily after breakfast.        Allergies: No Active Allergies  Family History: Family History  Problem Relation Age of Onset   Hypertension Mother    Heart attack Father     Social History:  reports that she has quit smoking. She has never used smokeless tobacco. She reports that she does not drink alcohol and does not use drugs.   Physical Exam: There were no vitals taken for this visit.  Constitutional:  Alert  and oriented, No acute distress. HEENT: [place] AT, moist mucus membranes.  Trachea midline, no masses. Cardiovascular: No clubbing, cyanosis, or edema. Respiratory: Normal respiratory effort, no increased work of breathing. GI: Abdomen is soft, nontender, nondistended, no abdominal masses GU: No CVA tenderness Lymph: No cervical or inguinal lymphadenopathy. Skin: No rashes, bruises or suspicious lesions. Neurologic: Grossly intact, no focal deficits, moving all 4 extremities. Psychiatric: Normal mood and affect.  Laboratory Data: Lab Results  Component Value Date   WBC 6.5 10/19/2023   HGB 9.2 (L) 10/19/2023   HCT 29.3 (L) 10/19/2023   MCV 103.5 (H) 10/19/2023   PLT 210 10/19/2023     Lab Results  Component Value Date   CREATININE 0.50 10/19/2023    No results found for: "PSA"  No results found for: "TESTOSTERONE"  Lab Results  Component Value Date   HGBA1C 10.5 (H) 07/12/2020    Urinalysis    Component Value Date/Time   COLORURINE AMBER (A) 10/16/2023 2210   APPEARANCEUR CLOUDY (A) 10/16/2023 2210   LABSPEC 1.041 (H) 10/16/2023 2210   PHURINE 5.0 10/16/2023 2210   GLUCOSEU NEGATIVE 10/16/2023 2210   HGBUR LARGE (A) 10/16/2023 2210   BILIRUBINUR NEGATIVE 10/16/2023 2210   KETONESUR 20 (A) 10/16/2023 2210   PROTEINUR 100 (A) 10/16/2023 2210   NITRITE NEGATIVE 10/16/2023 2210   LEUKOCYTESUR SMALL (A) 10/16/2023 2210    Lab Results  Component Value Date   BACTERIA MANY (A) 10/16/2023    Pertinent Imaging: CT   Hospital notes, labs, urine cx    Assessment & Plan:    Right ureteral stone - likely passed but can still have blockage. Check renal US   in next 4-6 weeks if she is able to make the appt. BP 90's but does not appear to be in acute distress. Pt is on hospice. Discussed with family.    Check US  and KUB - since she is on hospice, she may not get an US . OK to stop tamsulosin  to see if BP and interaction.    No follow-ups on file.  Christina Coyer, MD  [facility]  [address] [place], [place] [address] [phone]

## 2023-11-13 ENCOUNTER — Ambulatory Visit: Admitting: Physician Assistant

## 2023-11-25 DEATH — deceased
